# Patient Record
Sex: Male | Born: 1945 | Hispanic: Yes | Smoking: Former smoker
Health system: Southern US, Community
[De-identification: ages and names within clinical notes are randomized; demographics above are authoritative.]

## PROBLEM LIST (undated history)

## (undated) DIAGNOSIS — Z7901 Long term (current) use of anticoagulants: Secondary | ICD-10-CM

## (undated) DIAGNOSIS — M1990 Unspecified osteoarthritis, unspecified site: Secondary | ICD-10-CM

## (undated) DIAGNOSIS — I509 Heart failure, unspecified: Secondary | ICD-10-CM

## (undated) DIAGNOSIS — G8929 Other chronic pain: Secondary | ICD-10-CM

## (undated) DIAGNOSIS — I482 Chronic atrial fibrillation, unspecified: Secondary | ICD-10-CM

## (undated) DIAGNOSIS — I10 Essential (primary) hypertension: Secondary | ICD-10-CM

## (undated) DIAGNOSIS — R42 Dizziness and giddiness: Secondary | ICD-10-CM

## (undated) DIAGNOSIS — I420 Dilated cardiomyopathy: Secondary | ICD-10-CM

## (undated) DIAGNOSIS — I5022 Chronic systolic (congestive) heart failure: Secondary | ICD-10-CM

## (undated) DIAGNOSIS — E119 Type 2 diabetes mellitus without complications: Secondary | ICD-10-CM

## (undated) DIAGNOSIS — Z95810 Presence of automatic (implantable) cardiac defibrillator: Secondary | ICD-10-CM

## (undated) DIAGNOSIS — I429 Cardiomyopathy, unspecified: Secondary | ICD-10-CM

## (undated) DIAGNOSIS — E785 Hyperlipidemia, unspecified: Secondary | ICD-10-CM

## (undated) DIAGNOSIS — H269 Unspecified cataract: Secondary | ICD-10-CM

## (undated) DIAGNOSIS — I472 Ventricular tachycardia: Secondary | ICD-10-CM

## (undated) DIAGNOSIS — J449 Chronic obstructive pulmonary disease, unspecified: Secondary | ICD-10-CM

## (undated) HISTORY — DX: Heart failure, unspecified: I50.9

## (undated) HISTORY — PX: SPINE SURGERY: SHX786

## (undated) HISTORY — PX: EYE SURGERY: SHX253

## (undated) HISTORY — DX: Type 2 diabetes mellitus without complications: E11.9

## (undated) HISTORY — PX: ICD IMPLANT: EP1208

## (undated) HISTORY — DX: Unspecified cataract: H26.9

## (undated) HISTORY — DX: Hyperlipidemia, unspecified: E78.5

## (undated) HISTORY — DX: Essential (primary) hypertension: I10

## (undated) HISTORY — DX: Other chronic pain: G89.29

## (undated) HISTORY — DX: Chronic obstructive pulmonary disease, unspecified: J44.9

## (undated) HISTORY — DX: Unspecified osteoarthritis, unspecified site: M19.90

## (undated) HISTORY — DX: Dizziness and giddiness: R42

## (undated) HISTORY — DX: Cardiomyopathy, unspecified: I42.9

---
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
## 2011-09-21 DIAGNOSIS — E785 Hyperlipidemia, unspecified: Secondary | ICD-10-CM | POA: Diagnosis not present

## 2011-09-21 DIAGNOSIS — I10 Essential (primary) hypertension: Secondary | ICD-10-CM | POA: Diagnosis not present

## 2011-09-21 DIAGNOSIS — R42 Dizziness and giddiness: Secondary | ICD-10-CM | POA: Diagnosis not present

## 2011-11-02 DIAGNOSIS — M549 Dorsalgia, unspecified: Secondary | ICD-10-CM | POA: Diagnosis not present

## 2011-11-02 DIAGNOSIS — I10 Essential (primary) hypertension: Secondary | ICD-10-CM | POA: Diagnosis not present

## 2011-11-30 DIAGNOSIS — I10 Essential (primary) hypertension: Secondary | ICD-10-CM | POA: Diagnosis not present

## 2011-11-30 DIAGNOSIS — M549 Dorsalgia, unspecified: Secondary | ICD-10-CM | POA: Diagnosis not present

## 2011-12-29 DIAGNOSIS — I10 Essential (primary) hypertension: Secondary | ICD-10-CM | POA: Diagnosis not present

## 2011-12-29 DIAGNOSIS — M549 Dorsalgia, unspecified: Secondary | ICD-10-CM | POA: Diagnosis not present

## 2012-01-26 DIAGNOSIS — M549 Dorsalgia, unspecified: Secondary | ICD-10-CM | POA: Diagnosis not present

## 2012-02-23 DIAGNOSIS — I10 Essential (primary) hypertension: Secondary | ICD-10-CM | POA: Diagnosis not present

## 2012-02-23 DIAGNOSIS — M549 Dorsalgia, unspecified: Secondary | ICD-10-CM | POA: Diagnosis not present

## 2012-03-25 DIAGNOSIS — I10 Essential (primary) hypertension: Secondary | ICD-10-CM | POA: Diagnosis not present

## 2012-03-25 DIAGNOSIS — M549 Dorsalgia, unspecified: Secondary | ICD-10-CM | POA: Diagnosis not present

## 2012-04-25 DIAGNOSIS — E785 Hyperlipidemia, unspecified: Secondary | ICD-10-CM | POA: Diagnosis not present

## 2012-04-25 DIAGNOSIS — I10 Essential (primary) hypertension: Secondary | ICD-10-CM | POA: Diagnosis not present

## 2012-04-25 DIAGNOSIS — M549 Dorsalgia, unspecified: Secondary | ICD-10-CM | POA: Diagnosis not present

## 2012-04-25 DIAGNOSIS — E1129 Type 2 diabetes mellitus with other diabetic kidney complication: Secondary | ICD-10-CM | POA: Diagnosis not present

## 2012-05-23 DIAGNOSIS — M549 Dorsalgia, unspecified: Secondary | ICD-10-CM | POA: Diagnosis not present

## 2012-07-25 DIAGNOSIS — I2510 Atherosclerotic heart disease of native coronary artery without angina pectoris: Secondary | ICD-10-CM | POA: Diagnosis not present

## 2012-07-25 DIAGNOSIS — IMO0001 Reserved for inherently not codable concepts without codable children: Secondary | ICD-10-CM | POA: Diagnosis not present

## 2012-07-25 DIAGNOSIS — I10 Essential (primary) hypertension: Secondary | ICD-10-CM | POA: Diagnosis not present

## 2012-07-25 DIAGNOSIS — E785 Hyperlipidemia, unspecified: Secondary | ICD-10-CM | POA: Diagnosis not present

## 2012-08-14 DIAGNOSIS — E119 Type 2 diabetes mellitus without complications: Secondary | ICD-10-CM | POA: Diagnosis not present

## 2012-08-14 DIAGNOSIS — S335XXA Sprain of ligaments of lumbar spine, initial encounter: Secondary | ICD-10-CM | POA: Diagnosis not present

## 2012-08-14 DIAGNOSIS — X58XXXA Exposure to other specified factors, initial encounter: Secondary | ICD-10-CM | POA: Diagnosis not present

## 2012-08-14 DIAGNOSIS — F17200 Nicotine dependence, unspecified, uncomplicated: Secondary | ICD-10-CM | POA: Diagnosis not present

## 2012-08-14 DIAGNOSIS — I10 Essential (primary) hypertension: Secondary | ICD-10-CM | POA: Diagnosis not present

## 2012-08-14 DIAGNOSIS — R109 Unspecified abdominal pain: Secondary | ICD-10-CM | POA: Diagnosis not present

## 2012-08-14 DIAGNOSIS — I509 Heart failure, unspecified: Secondary | ICD-10-CM | POA: Diagnosis not present

## 2012-08-22 DIAGNOSIS — Z113 Encounter for screening for infections with a predominantly sexual mode of transmission: Secondary | ICD-10-CM | POA: Diagnosis not present

## 2012-08-22 DIAGNOSIS — E1129 Type 2 diabetes mellitus with other diabetic kidney complication: Secondary | ICD-10-CM | POA: Diagnosis not present

## 2012-08-22 DIAGNOSIS — R7989 Other specified abnormal findings of blood chemistry: Secondary | ICD-10-CM | POA: Diagnosis not present

## 2012-09-19 DIAGNOSIS — M549 Dorsalgia, unspecified: Secondary | ICD-10-CM | POA: Diagnosis not present

## 2012-09-19 DIAGNOSIS — E1129 Type 2 diabetes mellitus with other diabetic kidney complication: Secondary | ICD-10-CM | POA: Diagnosis not present

## 2012-09-19 DIAGNOSIS — I10 Essential (primary) hypertension: Secondary | ICD-10-CM | POA: Diagnosis not present

## 2012-10-22 DIAGNOSIS — M549 Dorsalgia, unspecified: Secondary | ICD-10-CM | POA: Diagnosis not present

## 2012-10-22 DIAGNOSIS — E119 Type 2 diabetes mellitus without complications: Secondary | ICD-10-CM | POA: Diagnosis not present

## 2012-10-22 DIAGNOSIS — E1165 Type 2 diabetes mellitus with hyperglycemia: Secondary | ICD-10-CM | POA: Diagnosis not present

## 2012-10-22 DIAGNOSIS — E1129 Type 2 diabetes mellitus with other diabetic kidney complication: Secondary | ICD-10-CM | POA: Diagnosis not present

## 2012-10-22 DIAGNOSIS — I10 Essential (primary) hypertension: Secondary | ICD-10-CM | POA: Diagnosis not present

## 2012-11-22 DIAGNOSIS — E1129 Type 2 diabetes mellitus with other diabetic kidney complication: Secondary | ICD-10-CM | POA: Diagnosis not present

## 2012-11-22 DIAGNOSIS — I10 Essential (primary) hypertension: Secondary | ICD-10-CM | POA: Diagnosis not present

## 2012-11-22 DIAGNOSIS — M549 Dorsalgia, unspecified: Secondary | ICD-10-CM | POA: Diagnosis not present

## 2012-12-24 DIAGNOSIS — M549 Dorsalgia, unspecified: Secondary | ICD-10-CM | POA: Diagnosis not present

## 2012-12-24 DIAGNOSIS — I10 Essential (primary) hypertension: Secondary | ICD-10-CM | POA: Diagnosis not present

## 2013-01-22 DIAGNOSIS — I10 Essential (primary) hypertension: Secondary | ICD-10-CM | POA: Diagnosis not present

## 2013-01-22 DIAGNOSIS — M549 Dorsalgia, unspecified: Secondary | ICD-10-CM | POA: Diagnosis not present

## 2013-01-22 DIAGNOSIS — E1129 Type 2 diabetes mellitus with other diabetic kidney complication: Secondary | ICD-10-CM | POA: Diagnosis not present

## 2013-02-21 DIAGNOSIS — M549 Dorsalgia, unspecified: Secondary | ICD-10-CM | POA: Diagnosis not present

## 2013-02-21 DIAGNOSIS — I10 Essential (primary) hypertension: Secondary | ICD-10-CM | POA: Diagnosis not present

## 2013-03-11 DIAGNOSIS — E119 Type 2 diabetes mellitus without complications: Secondary | ICD-10-CM | POA: Diagnosis not present

## 2013-03-11 DIAGNOSIS — I10 Essential (primary) hypertension: Secondary | ICD-10-CM | POA: Diagnosis not present

## 2013-03-11 DIAGNOSIS — J449 Chronic obstructive pulmonary disease, unspecified: Secondary | ICD-10-CM | POA: Diagnosis not present

## 2013-03-11 DIAGNOSIS — R209 Unspecified disturbances of skin sensation: Secondary | ICD-10-CM | POA: Diagnosis not present

## 2013-03-11 DIAGNOSIS — I509 Heart failure, unspecified: Secondary | ICD-10-CM | POA: Diagnosis not present

## 2013-03-11 DIAGNOSIS — I4891 Unspecified atrial fibrillation: Secondary | ICD-10-CM | POA: Diagnosis not present

## 2013-03-11 DIAGNOSIS — R5381 Other malaise: Secondary | ICD-10-CM | POA: Diagnosis not present

## 2013-03-24 DIAGNOSIS — I10 Essential (primary) hypertension: Secondary | ICD-10-CM | POA: Diagnosis not present

## 2013-03-24 DIAGNOSIS — G8929 Other chronic pain: Secondary | ICD-10-CM | POA: Diagnosis not present

## 2013-04-07 DIAGNOSIS — F411 Generalized anxiety disorder: Secondary | ICD-10-CM | POA: Diagnosis not present

## 2013-04-07 DIAGNOSIS — F331 Major depressive disorder, recurrent, moderate: Secondary | ICD-10-CM | POA: Diagnosis not present

## 2013-04-11 DIAGNOSIS — F331 Major depressive disorder, recurrent, moderate: Secondary | ICD-10-CM | POA: Diagnosis not present

## 2013-04-11 DIAGNOSIS — F411 Generalized anxiety disorder: Secondary | ICD-10-CM | POA: Diagnosis not present

## 2013-04-14 DIAGNOSIS — F411 Generalized anxiety disorder: Secondary | ICD-10-CM | POA: Diagnosis not present

## 2013-04-14 DIAGNOSIS — F331 Major depressive disorder, recurrent, moderate: Secondary | ICD-10-CM | POA: Diagnosis not present

## 2013-04-16 DIAGNOSIS — F411 Generalized anxiety disorder: Secondary | ICD-10-CM | POA: Diagnosis not present

## 2013-04-16 DIAGNOSIS — F331 Major depressive disorder, recurrent, moderate: Secondary | ICD-10-CM | POA: Diagnosis not present

## 2013-04-18 DIAGNOSIS — F331 Major depressive disorder, recurrent, moderate: Secondary | ICD-10-CM | POA: Diagnosis not present

## 2013-04-18 DIAGNOSIS — F411 Generalized anxiety disorder: Secondary | ICD-10-CM | POA: Diagnosis not present

## 2013-04-21 DIAGNOSIS — F331 Major depressive disorder, recurrent, moderate: Secondary | ICD-10-CM | POA: Diagnosis not present

## 2013-04-21 DIAGNOSIS — F411 Generalized anxiety disorder: Secondary | ICD-10-CM | POA: Diagnosis not present

## 2013-04-23 DIAGNOSIS — F331 Major depressive disorder, recurrent, moderate: Secondary | ICD-10-CM | POA: Diagnosis not present

## 2013-04-23 DIAGNOSIS — F411 Generalized anxiety disorder: Secondary | ICD-10-CM | POA: Diagnosis not present

## 2013-04-25 DIAGNOSIS — F411 Generalized anxiety disorder: Secondary | ICD-10-CM | POA: Diagnosis not present

## 2013-04-25 DIAGNOSIS — F331 Major depressive disorder, recurrent, moderate: Secondary | ICD-10-CM | POA: Diagnosis not present

## 2013-05-06 DIAGNOSIS — F411 Generalized anxiety disorder: Secondary | ICD-10-CM | POA: Diagnosis not present

## 2013-05-06 DIAGNOSIS — F331 Major depressive disorder, recurrent, moderate: Secondary | ICD-10-CM | POA: Diagnosis not present

## 2013-05-08 DIAGNOSIS — F331 Major depressive disorder, recurrent, moderate: Secondary | ICD-10-CM | POA: Diagnosis not present

## 2013-05-08 DIAGNOSIS — F411 Generalized anxiety disorder: Secondary | ICD-10-CM | POA: Diagnosis not present

## 2013-05-10 DIAGNOSIS — F331 Major depressive disorder, recurrent, moderate: Secondary | ICD-10-CM | POA: Diagnosis not present

## 2013-05-10 DIAGNOSIS — F411 Generalized anxiety disorder: Secondary | ICD-10-CM | POA: Diagnosis not present

## 2013-05-13 DIAGNOSIS — F411 Generalized anxiety disorder: Secondary | ICD-10-CM | POA: Diagnosis not present

## 2013-05-13 DIAGNOSIS — F331 Major depressive disorder, recurrent, moderate: Secondary | ICD-10-CM | POA: Diagnosis not present

## 2013-05-17 DIAGNOSIS — F331 Major depressive disorder, recurrent, moderate: Secondary | ICD-10-CM | POA: Diagnosis not present

## 2013-05-17 DIAGNOSIS — F411 Generalized anxiety disorder: Secondary | ICD-10-CM | POA: Diagnosis not present

## 2013-05-20 DIAGNOSIS — F411 Generalized anxiety disorder: Secondary | ICD-10-CM | POA: Diagnosis not present

## 2013-05-20 DIAGNOSIS — F331 Major depressive disorder, recurrent, moderate: Secondary | ICD-10-CM | POA: Diagnosis not present

## 2013-05-21 DIAGNOSIS — E785 Hyperlipidemia, unspecified: Secondary | ICD-10-CM | POA: Diagnosis not present

## 2013-05-22 DIAGNOSIS — F411 Generalized anxiety disorder: Secondary | ICD-10-CM | POA: Diagnosis not present

## 2013-05-22 DIAGNOSIS — F331 Major depressive disorder, recurrent, moderate: Secondary | ICD-10-CM | POA: Diagnosis not present

## 2013-05-24 DIAGNOSIS — F331 Major depressive disorder, recurrent, moderate: Secondary | ICD-10-CM | POA: Diagnosis not present

## 2013-05-24 DIAGNOSIS — F411 Generalized anxiety disorder: Secondary | ICD-10-CM | POA: Diagnosis not present

## 2013-05-26 DIAGNOSIS — F331 Major depressive disorder, recurrent, moderate: Secondary | ICD-10-CM | POA: Diagnosis not present

## 2013-05-26 DIAGNOSIS — F411 Generalized anxiety disorder: Secondary | ICD-10-CM | POA: Diagnosis not present

## 2013-05-28 DIAGNOSIS — F411 Generalized anxiety disorder: Secondary | ICD-10-CM | POA: Diagnosis not present

## 2013-05-28 DIAGNOSIS — F331 Major depressive disorder, recurrent, moderate: Secondary | ICD-10-CM | POA: Diagnosis not present

## 2013-05-30 DIAGNOSIS — F411 Generalized anxiety disorder: Secondary | ICD-10-CM | POA: Diagnosis not present

## 2013-05-30 DIAGNOSIS — F331 Major depressive disorder, recurrent, moderate: Secondary | ICD-10-CM | POA: Diagnosis not present

## 2013-06-02 DIAGNOSIS — F411 Generalized anxiety disorder: Secondary | ICD-10-CM | POA: Diagnosis not present

## 2013-06-02 DIAGNOSIS — F331 Major depressive disorder, recurrent, moderate: Secondary | ICD-10-CM | POA: Diagnosis not present

## 2013-06-04 DIAGNOSIS — F411 Generalized anxiety disorder: Secondary | ICD-10-CM | POA: Diagnosis not present

## 2013-06-04 DIAGNOSIS — F331 Major depressive disorder, recurrent, moderate: Secondary | ICD-10-CM | POA: Diagnosis not present

## 2013-06-06 DIAGNOSIS — F331 Major depressive disorder, recurrent, moderate: Secondary | ICD-10-CM | POA: Diagnosis not present

## 2013-06-06 DIAGNOSIS — F411 Generalized anxiety disorder: Secondary | ICD-10-CM | POA: Diagnosis not present

## 2013-06-09 DIAGNOSIS — F411 Generalized anxiety disorder: Secondary | ICD-10-CM | POA: Diagnosis not present

## 2013-06-09 DIAGNOSIS — F331 Major depressive disorder, recurrent, moderate: Secondary | ICD-10-CM | POA: Diagnosis not present

## 2013-06-11 DIAGNOSIS — F331 Major depressive disorder, recurrent, moderate: Secondary | ICD-10-CM | POA: Diagnosis not present

## 2013-06-11 DIAGNOSIS — F411 Generalized anxiety disorder: Secondary | ICD-10-CM | POA: Diagnosis not present

## 2013-06-13 DIAGNOSIS — F411 Generalized anxiety disorder: Secondary | ICD-10-CM | POA: Diagnosis not present

## 2013-06-13 DIAGNOSIS — F331 Major depressive disorder, recurrent, moderate: Secondary | ICD-10-CM | POA: Diagnosis not present

## 2013-07-24 DIAGNOSIS — G8929 Other chronic pain: Secondary | ICD-10-CM | POA: Diagnosis not present

## 2013-08-21 DIAGNOSIS — E785 Hyperlipidemia, unspecified: Secondary | ICD-10-CM | POA: Diagnosis not present

## 2013-08-21 DIAGNOSIS — G8929 Other chronic pain: Secondary | ICD-10-CM | POA: Diagnosis not present

## 2013-08-21 DIAGNOSIS — I10 Essential (primary) hypertension: Secondary | ICD-10-CM | POA: Diagnosis not present

## 2013-08-21 DIAGNOSIS — IMO0001 Reserved for inherently not codable concepts without codable children: Secondary | ICD-10-CM | POA: Diagnosis not present

## 2013-08-23 DIAGNOSIS — R0902 Hypoxemia: Secondary | ICD-10-CM | POA: Diagnosis not present

## 2013-08-23 DIAGNOSIS — R0602 Shortness of breath: Secondary | ICD-10-CM | POA: Diagnosis not present

## 2013-08-23 DIAGNOSIS — Z91199 Patient's noncompliance with other medical treatment and regimen due to unspecified reason: Secondary | ICD-10-CM | POA: Diagnosis not present

## 2013-08-23 DIAGNOSIS — I4891 Unspecified atrial fibrillation: Secondary | ICD-10-CM | POA: Diagnosis not present

## 2013-08-23 DIAGNOSIS — I4949 Other premature depolarization: Secondary | ICD-10-CM | POA: Diagnosis not present

## 2013-08-23 DIAGNOSIS — I472 Ventricular tachycardia, unspecified: Secondary | ICD-10-CM | POA: Diagnosis not present

## 2013-08-23 DIAGNOSIS — E1159 Type 2 diabetes mellitus with other circulatory complications: Secondary | ICD-10-CM | POA: Diagnosis not present

## 2013-08-23 DIAGNOSIS — I498 Other specified cardiac arrhythmias: Secondary | ICD-10-CM | POA: Diagnosis not present

## 2013-08-23 DIAGNOSIS — I10 Essential (primary) hypertension: Secondary | ICD-10-CM | POA: Diagnosis not present

## 2013-08-23 DIAGNOSIS — I4729 Other ventricular tachycardia: Secondary | ICD-10-CM | POA: Diagnosis not present

## 2013-08-23 DIAGNOSIS — Z87891 Personal history of nicotine dependence: Secondary | ICD-10-CM | POA: Diagnosis not present

## 2013-08-23 DIAGNOSIS — I2589 Other forms of chronic ischemic heart disease: Secondary | ICD-10-CM | POA: Diagnosis not present

## 2013-08-23 DIAGNOSIS — I369 Nonrheumatic tricuspid valve disorder, unspecified: Secondary | ICD-10-CM | POA: Diagnosis not present

## 2013-08-23 DIAGNOSIS — I059 Rheumatic mitral valve disease, unspecified: Secondary | ICD-10-CM | POA: Diagnosis not present

## 2013-08-23 DIAGNOSIS — Z9119 Patient's noncompliance with other medical treatment and regimen: Secondary | ICD-10-CM | POA: Diagnosis not present

## 2013-08-23 DIAGNOSIS — R0789 Other chest pain: Secondary | ICD-10-CM | POA: Diagnosis not present

## 2013-08-23 DIAGNOSIS — I471 Supraventricular tachycardia: Secondary | ICD-10-CM | POA: Diagnosis not present

## 2013-08-23 DIAGNOSIS — I252 Old myocardial infarction: Secondary | ICD-10-CM | POA: Diagnosis not present

## 2013-08-23 DIAGNOSIS — I5023 Acute on chronic systolic (congestive) heart failure: Secondary | ICD-10-CM | POA: Diagnosis not present

## 2013-08-23 DIAGNOSIS — J449 Chronic obstructive pulmonary disease, unspecified: Secondary | ICD-10-CM | POA: Diagnosis not present

## 2013-08-23 DIAGNOSIS — I5020 Unspecified systolic (congestive) heart failure: Secondary | ICD-10-CM | POA: Diagnosis not present

## 2013-08-23 DIAGNOSIS — J9600 Acute respiratory failure, unspecified whether with hypoxia or hypercapnia: Secondary | ICD-10-CM | POA: Diagnosis present

## 2013-08-23 DIAGNOSIS — I428 Other cardiomyopathies: Secondary | ICD-10-CM | POA: Diagnosis not present

## 2013-08-23 DIAGNOSIS — E119 Type 2 diabetes mellitus without complications: Secondary | ICD-10-CM | POA: Diagnosis present

## 2013-08-23 DIAGNOSIS — I359 Nonrheumatic aortic valve disorder, unspecified: Secondary | ICD-10-CM | POA: Diagnosis not present

## 2013-08-23 DIAGNOSIS — I501 Left ventricular failure: Secondary | ICD-10-CM | POA: Diagnosis not present

## 2013-08-23 DIAGNOSIS — I319 Disease of pericardium, unspecified: Secondary | ICD-10-CM | POA: Diagnosis not present

## 2013-08-23 DIAGNOSIS — I509 Heart failure, unspecified: Secondary | ICD-10-CM | POA: Diagnosis not present

## 2013-08-27 DIAGNOSIS — I501 Left ventricular failure: Secondary | ICD-10-CM | POA: Diagnosis not present

## 2013-08-28 DIAGNOSIS — I5023 Acute on chronic systolic (congestive) heart failure: Secondary | ICD-10-CM | POA: Diagnosis present

## 2013-08-28 DIAGNOSIS — E119 Type 2 diabetes mellitus without complications: Secondary | ICD-10-CM | POA: Diagnosis present

## 2013-08-28 DIAGNOSIS — I472 Ventricular tachycardia: Secondary | ICD-10-CM | POA: Diagnosis present

## 2013-08-28 DIAGNOSIS — I10 Essential (primary) hypertension: Secondary | ICD-10-CM | POA: Diagnosis present

## 2013-08-28 DIAGNOSIS — R079 Chest pain, unspecified: Secondary | ICD-10-CM | POA: Diagnosis not present

## 2013-08-28 DIAGNOSIS — I4729 Other ventricular tachycardia: Secondary | ICD-10-CM | POA: Diagnosis present

## 2013-08-28 DIAGNOSIS — I4949 Other premature depolarization: Secondary | ICD-10-CM | POA: Diagnosis present

## 2013-08-28 DIAGNOSIS — I252 Old myocardial infarction: Secondary | ICD-10-CM | POA: Diagnosis not present

## 2013-08-28 DIAGNOSIS — I4891 Unspecified atrial fibrillation: Secondary | ICD-10-CM | POA: Diagnosis present

## 2013-08-28 DIAGNOSIS — I509 Heart failure, unspecified: Secondary | ICD-10-CM | POA: Diagnosis present

## 2013-08-28 DIAGNOSIS — I428 Other cardiomyopathies: Secondary | ICD-10-CM | POA: Diagnosis present

## 2013-08-28 DIAGNOSIS — I501 Left ventricular failure: Secondary | ICD-10-CM | POA: Diagnosis not present

## 2013-08-28 DIAGNOSIS — Z87891 Personal history of nicotine dependence: Secondary | ICD-10-CM | POA: Diagnosis not present

## 2013-08-28 DIAGNOSIS — J449 Chronic obstructive pulmonary disease, unspecified: Secondary | ICD-10-CM | POA: Diagnosis present

## 2013-08-29 DIAGNOSIS — I4891 Unspecified atrial fibrillation: Secondary | ICD-10-CM | POA: Diagnosis not present

## 2013-08-29 DIAGNOSIS — I501 Left ventricular failure: Secondary | ICD-10-CM | POA: Diagnosis not present

## 2013-09-01 DIAGNOSIS — I2510 Atherosclerotic heart disease of native coronary artery without angina pectoris: Secondary | ICD-10-CM | POA: Diagnosis not present

## 2013-09-01 DIAGNOSIS — I4891 Unspecified atrial fibrillation: Secondary | ICD-10-CM | POA: Diagnosis not present

## 2013-09-01 DIAGNOSIS — I509 Heart failure, unspecified: Secondary | ICD-10-CM | POA: Diagnosis not present

## 2013-09-01 DIAGNOSIS — E119 Type 2 diabetes mellitus without complications: Secondary | ICD-10-CM | POA: Diagnosis not present

## 2013-09-01 DIAGNOSIS — I10 Essential (primary) hypertension: Secondary | ICD-10-CM | POA: Diagnosis not present

## 2013-09-01 DIAGNOSIS — Z79899 Other long term (current) drug therapy: Secondary | ICD-10-CM | POA: Diagnosis not present

## 2013-09-01 DIAGNOSIS — Z7901 Long term (current) use of anticoagulants: Secondary | ICD-10-CM | POA: Diagnosis not present

## 2013-09-01 DIAGNOSIS — J449 Chronic obstructive pulmonary disease, unspecified: Secondary | ICD-10-CM | POA: Diagnosis not present

## 2013-09-01 DIAGNOSIS — I428 Other cardiomyopathies: Secondary | ICD-10-CM | POA: Diagnosis not present

## 2013-09-01 DIAGNOSIS — Z87891 Personal history of nicotine dependence: Secondary | ICD-10-CM | POA: Diagnosis not present

## 2013-09-02 DIAGNOSIS — Z7901 Long term (current) use of anticoagulants: Secondary | ICD-10-CM | POA: Diagnosis not present

## 2013-09-02 DIAGNOSIS — I2510 Atherosclerotic heart disease of native coronary artery without angina pectoris: Secondary | ICD-10-CM | POA: Diagnosis not present

## 2013-09-02 DIAGNOSIS — I10 Essential (primary) hypertension: Secondary | ICD-10-CM | POA: Diagnosis not present

## 2013-09-02 DIAGNOSIS — Z79899 Other long term (current) drug therapy: Secondary | ICD-10-CM | POA: Diagnosis not present

## 2013-09-02 DIAGNOSIS — I509 Heart failure, unspecified: Secondary | ICD-10-CM | POA: Diagnosis not present

## 2013-09-02 DIAGNOSIS — Z45018 Encounter for adjustment and management of other part of cardiac pacemaker: Secondary | ICD-10-CM | POA: Diagnosis not present

## 2013-09-02 DIAGNOSIS — J449 Chronic obstructive pulmonary disease, unspecified: Secondary | ICD-10-CM | POA: Diagnosis not present

## 2013-09-02 DIAGNOSIS — I501 Left ventricular failure: Secondary | ICD-10-CM | POA: Diagnosis not present

## 2013-09-02 DIAGNOSIS — I428 Other cardiomyopathies: Secondary | ICD-10-CM | POA: Diagnosis not present

## 2013-09-02 DIAGNOSIS — I4891 Unspecified atrial fibrillation: Secondary | ICD-10-CM | POA: Diagnosis not present

## 2013-09-03 DIAGNOSIS — I428 Other cardiomyopathies: Secondary | ICD-10-CM | POA: Diagnosis not present

## 2013-09-03 DIAGNOSIS — I10 Essential (primary) hypertension: Secondary | ICD-10-CM | POA: Diagnosis not present

## 2013-09-03 DIAGNOSIS — Z7901 Long term (current) use of anticoagulants: Secondary | ICD-10-CM | POA: Diagnosis not present

## 2013-09-03 DIAGNOSIS — I4891 Unspecified atrial fibrillation: Secondary | ICD-10-CM | POA: Diagnosis not present

## 2013-09-03 DIAGNOSIS — Z79899 Other long term (current) drug therapy: Secondary | ICD-10-CM | POA: Diagnosis not present

## 2013-09-03 DIAGNOSIS — I509 Heart failure, unspecified: Secondary | ICD-10-CM | POA: Diagnosis not present

## 2013-09-16 DIAGNOSIS — I428 Other cardiomyopathies: Secondary | ICD-10-CM | POA: Diagnosis not present

## 2013-09-16 DIAGNOSIS — Z95810 Presence of automatic (implantable) cardiac defibrillator: Secondary | ICD-10-CM | POA: Diagnosis not present

## 2013-09-18 DIAGNOSIS — I509 Heart failure, unspecified: Secondary | ICD-10-CM | POA: Diagnosis not present

## 2013-09-18 DIAGNOSIS — G8929 Other chronic pain: Secondary | ICD-10-CM | POA: Diagnosis not present

## 2013-10-20 DIAGNOSIS — J449 Chronic obstructive pulmonary disease, unspecified: Secondary | ICD-10-CM | POA: Diagnosis not present

## 2013-10-20 DIAGNOSIS — G8929 Other chronic pain: Secondary | ICD-10-CM | POA: Diagnosis not present

## 2013-11-12 DIAGNOSIS — I10 Essential (primary) hypertension: Secondary | ICD-10-CM | POA: Diagnosis not present

## 2013-11-12 DIAGNOSIS — Z95810 Presence of automatic (implantable) cardiac defibrillator: Secondary | ICD-10-CM | POA: Diagnosis not present

## 2013-11-12 DIAGNOSIS — I252 Old myocardial infarction: Secondary | ICD-10-CM | POA: Diagnosis not present

## 2013-11-12 DIAGNOSIS — R0602 Shortness of breath: Secondary | ICD-10-CM | POA: Diagnosis not present

## 2013-11-12 DIAGNOSIS — I4891 Unspecified atrial fibrillation: Secondary | ICD-10-CM | POA: Diagnosis not present

## 2013-11-12 DIAGNOSIS — J441 Chronic obstructive pulmonary disease with (acute) exacerbation: Secondary | ICD-10-CM | POA: Diagnosis not present

## 2013-11-12 DIAGNOSIS — J9819 Other pulmonary collapse: Secondary | ICD-10-CM | POA: Diagnosis not present

## 2013-11-12 DIAGNOSIS — E119 Type 2 diabetes mellitus without complications: Secondary | ICD-10-CM | POA: Diagnosis not present

## 2013-11-12 DIAGNOSIS — I509 Heart failure, unspecified: Secondary | ICD-10-CM | POA: Diagnosis not present

## 2013-12-23 DIAGNOSIS — E669 Obesity, unspecified: Secondary | ICD-10-CM | POA: Diagnosis not present

## 2013-12-23 DIAGNOSIS — E119 Type 2 diabetes mellitus without complications: Secondary | ICD-10-CM | POA: Diagnosis not present

## 2013-12-23 DIAGNOSIS — J449 Chronic obstructive pulmonary disease, unspecified: Secondary | ICD-10-CM | POA: Diagnosis not present

## 2013-12-23 DIAGNOSIS — I10 Essential (primary) hypertension: Secondary | ICD-10-CM | POA: Diagnosis not present

## 2014-01-08 DIAGNOSIS — R0989 Other specified symptoms and signs involving the circulatory and respiratory systems: Secondary | ICD-10-CM | POA: Diagnosis not present

## 2014-01-08 DIAGNOSIS — J449 Chronic obstructive pulmonary disease, unspecified: Secondary | ICD-10-CM | POA: Diagnosis not present

## 2014-01-08 DIAGNOSIS — I509 Heart failure, unspecified: Secondary | ICD-10-CM | POA: Diagnosis not present

## 2014-01-08 DIAGNOSIS — R0609 Other forms of dyspnea: Secondary | ICD-10-CM | POA: Diagnosis not present

## 2014-01-08 DIAGNOSIS — R0602 Shortness of breath: Secondary | ICD-10-CM | POA: Diagnosis not present

## 2014-01-10 DIAGNOSIS — I4891 Unspecified atrial fibrillation: Secondary | ICD-10-CM | POA: Diagnosis not present

## 2014-01-10 DIAGNOSIS — Z7982 Long term (current) use of aspirin: Secondary | ICD-10-CM | POA: Diagnosis not present

## 2014-01-10 DIAGNOSIS — E119 Type 2 diabetes mellitus without complications: Secondary | ICD-10-CM | POA: Diagnosis present

## 2014-01-10 DIAGNOSIS — Z9119 Patient's noncompliance with other medical treatment and regimen: Secondary | ICD-10-CM | POA: Diagnosis not present

## 2014-01-10 DIAGNOSIS — I252 Old myocardial infarction: Secondary | ICD-10-CM | POA: Diagnosis not present

## 2014-01-10 DIAGNOSIS — I428 Other cardiomyopathies: Secondary | ICD-10-CM | POA: Diagnosis not present

## 2014-01-10 DIAGNOSIS — Z7901 Long term (current) use of anticoagulants: Secondary | ICD-10-CM | POA: Diagnosis not present

## 2014-01-10 DIAGNOSIS — Z91199 Patient's noncompliance with other medical treatment and regimen due to unspecified reason: Secondary | ICD-10-CM | POA: Diagnosis not present

## 2014-01-10 DIAGNOSIS — R0609 Other forms of dyspnea: Secondary | ICD-10-CM | POA: Diagnosis not present

## 2014-01-10 DIAGNOSIS — J90 Pleural effusion, not elsewhere classified: Secondary | ICD-10-CM | POA: Diagnosis not present

## 2014-01-10 DIAGNOSIS — I5023 Acute on chronic systolic (congestive) heart failure: Secondary | ICD-10-CM | POA: Diagnosis not present

## 2014-01-10 DIAGNOSIS — I2510 Atherosclerotic heart disease of native coronary artery without angina pectoris: Secondary | ICD-10-CM | POA: Diagnosis present

## 2014-01-10 DIAGNOSIS — Z79899 Other long term (current) drug therapy: Secondary | ICD-10-CM | POA: Diagnosis not present

## 2014-01-10 DIAGNOSIS — I509 Heart failure, unspecified: Secondary | ICD-10-CM | POA: Diagnosis not present

## 2014-01-10 DIAGNOSIS — R0602 Shortness of breath: Secondary | ICD-10-CM | POA: Diagnosis not present

## 2014-01-10 DIAGNOSIS — Z87891 Personal history of nicotine dependence: Secondary | ICD-10-CM | POA: Diagnosis not present

## 2014-01-10 DIAGNOSIS — Z95810 Presence of automatic (implantable) cardiac defibrillator: Secondary | ICD-10-CM | POA: Diagnosis not present

## 2014-01-10 DIAGNOSIS — M129 Arthropathy, unspecified: Secondary | ICD-10-CM | POA: Diagnosis present

## 2014-01-10 DIAGNOSIS — R0989 Other specified symptoms and signs involving the circulatory and respiratory systems: Secondary | ICD-10-CM | POA: Diagnosis not present

## 2014-01-10 DIAGNOSIS — R0902 Hypoxemia: Secondary | ICD-10-CM | POA: Diagnosis not present

## 2014-01-10 DIAGNOSIS — I10 Essential (primary) hypertension: Secondary | ICD-10-CM | POA: Diagnosis present

## 2014-01-10 DIAGNOSIS — J441 Chronic obstructive pulmonary disease with (acute) exacerbation: Secondary | ICD-10-CM | POA: Diagnosis not present

## 2014-01-15 DIAGNOSIS — I4891 Unspecified atrial fibrillation: Secondary | ICD-10-CM | POA: Diagnosis not present

## 2014-01-15 DIAGNOSIS — I10 Essential (primary) hypertension: Secondary | ICD-10-CM | POA: Diagnosis not present

## 2014-01-15 DIAGNOSIS — I5023 Acute on chronic systolic (congestive) heart failure: Secondary | ICD-10-CM | POA: Diagnosis not present

## 2014-01-15 DIAGNOSIS — J441 Chronic obstructive pulmonary disease with (acute) exacerbation: Secondary | ICD-10-CM | POA: Diagnosis not present

## 2014-01-15 DIAGNOSIS — E119 Type 2 diabetes mellitus without complications: Secondary | ICD-10-CM | POA: Diagnosis not present

## 2014-01-15 DIAGNOSIS — I2510 Atherosclerotic heart disease of native coronary artery without angina pectoris: Secondary | ICD-10-CM | POA: Diagnosis not present

## 2014-01-16 DIAGNOSIS — I509 Heart failure, unspecified: Secondary | ICD-10-CM | POA: Diagnosis not present

## 2014-01-16 DIAGNOSIS — I4891 Unspecified atrial fibrillation: Secondary | ICD-10-CM | POA: Diagnosis not present

## 2014-01-16 DIAGNOSIS — I2510 Atherosclerotic heart disease of native coronary artery without angina pectoris: Secondary | ICD-10-CM | POA: Diagnosis not present

## 2014-01-16 DIAGNOSIS — I5023 Acute on chronic systolic (congestive) heart failure: Secondary | ICD-10-CM | POA: Diagnosis not present

## 2014-01-16 DIAGNOSIS — J441 Chronic obstructive pulmonary disease with (acute) exacerbation: Secondary | ICD-10-CM | POA: Diagnosis not present

## 2014-01-16 DIAGNOSIS — E119 Type 2 diabetes mellitus without complications: Secondary | ICD-10-CM | POA: Diagnosis not present

## 2014-01-16 DIAGNOSIS — I10 Essential (primary) hypertension: Secondary | ICD-10-CM | POA: Diagnosis not present

## 2014-01-21 DIAGNOSIS — I10 Essential (primary) hypertension: Secondary | ICD-10-CM | POA: Diagnosis not present

## 2014-01-21 DIAGNOSIS — I2510 Atherosclerotic heart disease of native coronary artery without angina pectoris: Secondary | ICD-10-CM | POA: Diagnosis not present

## 2014-01-21 DIAGNOSIS — I5023 Acute on chronic systolic (congestive) heart failure: Secondary | ICD-10-CM | POA: Diagnosis not present

## 2014-01-21 DIAGNOSIS — I4891 Unspecified atrial fibrillation: Secondary | ICD-10-CM | POA: Diagnosis not present

## 2014-01-21 DIAGNOSIS — J441 Chronic obstructive pulmonary disease with (acute) exacerbation: Secondary | ICD-10-CM | POA: Diagnosis not present

## 2014-01-21 DIAGNOSIS — E119 Type 2 diabetes mellitus without complications: Secondary | ICD-10-CM | POA: Diagnosis not present

## 2014-01-22 DIAGNOSIS — I4891 Unspecified atrial fibrillation: Secondary | ICD-10-CM | POA: Diagnosis not present

## 2014-01-22 DIAGNOSIS — I10 Essential (primary) hypertension: Secondary | ICD-10-CM | POA: Diagnosis not present

## 2014-01-22 DIAGNOSIS — I2510 Atherosclerotic heart disease of native coronary artery without angina pectoris: Secondary | ICD-10-CM | POA: Diagnosis not present

## 2014-01-22 DIAGNOSIS — E119 Type 2 diabetes mellitus without complications: Secondary | ICD-10-CM | POA: Diagnosis not present

## 2014-01-22 DIAGNOSIS — J441 Chronic obstructive pulmonary disease with (acute) exacerbation: Secondary | ICD-10-CM | POA: Diagnosis not present

## 2014-01-22 DIAGNOSIS — I5023 Acute on chronic systolic (congestive) heart failure: Secondary | ICD-10-CM | POA: Diagnosis not present

## 2014-01-23 DIAGNOSIS — I4891 Unspecified atrial fibrillation: Secondary | ICD-10-CM | POA: Diagnosis not present

## 2014-01-23 DIAGNOSIS — I5023 Acute on chronic systolic (congestive) heart failure: Secondary | ICD-10-CM | POA: Diagnosis not present

## 2014-01-23 DIAGNOSIS — I10 Essential (primary) hypertension: Secondary | ICD-10-CM | POA: Diagnosis not present

## 2014-01-23 DIAGNOSIS — I2510 Atherosclerotic heart disease of native coronary artery without angina pectoris: Secondary | ICD-10-CM | POA: Diagnosis not present

## 2014-01-23 DIAGNOSIS — J441 Chronic obstructive pulmonary disease with (acute) exacerbation: Secondary | ICD-10-CM | POA: Diagnosis not present

## 2014-01-23 DIAGNOSIS — E119 Type 2 diabetes mellitus without complications: Secondary | ICD-10-CM | POA: Diagnosis not present

## 2014-01-26 DIAGNOSIS — J441 Chronic obstructive pulmonary disease with (acute) exacerbation: Secondary | ICD-10-CM | POA: Diagnosis not present

## 2014-01-26 DIAGNOSIS — E119 Type 2 diabetes mellitus without complications: Secondary | ICD-10-CM | POA: Diagnosis not present

## 2014-01-26 DIAGNOSIS — I5023 Acute on chronic systolic (congestive) heart failure: Secondary | ICD-10-CM | POA: Diagnosis not present

## 2014-01-26 DIAGNOSIS — I2510 Atherosclerotic heart disease of native coronary artery without angina pectoris: Secondary | ICD-10-CM | POA: Diagnosis not present

## 2014-01-26 DIAGNOSIS — I4891 Unspecified atrial fibrillation: Secondary | ICD-10-CM | POA: Diagnosis not present

## 2014-01-26 DIAGNOSIS — I10 Essential (primary) hypertension: Secondary | ICD-10-CM | POA: Diagnosis not present

## 2014-01-29 DIAGNOSIS — I428 Other cardiomyopathies: Secondary | ICD-10-CM | POA: Diagnosis not present

## 2014-01-30 DIAGNOSIS — E119 Type 2 diabetes mellitus without complications: Secondary | ICD-10-CM | POA: Diagnosis not present

## 2014-01-30 DIAGNOSIS — I4891 Unspecified atrial fibrillation: Secondary | ICD-10-CM | POA: Diagnosis not present

## 2014-01-30 DIAGNOSIS — I10 Essential (primary) hypertension: Secondary | ICD-10-CM | POA: Diagnosis not present

## 2014-01-30 DIAGNOSIS — I2510 Atherosclerotic heart disease of native coronary artery without angina pectoris: Secondary | ICD-10-CM | POA: Diagnosis not present

## 2014-01-30 DIAGNOSIS — I5023 Acute on chronic systolic (congestive) heart failure: Secondary | ICD-10-CM | POA: Diagnosis not present

## 2014-01-30 DIAGNOSIS — J441 Chronic obstructive pulmonary disease with (acute) exacerbation: Secondary | ICD-10-CM | POA: Diagnosis not present

## 2014-02-02 DIAGNOSIS — I10 Essential (primary) hypertension: Secondary | ICD-10-CM | POA: Diagnosis not present

## 2014-02-02 DIAGNOSIS — J441 Chronic obstructive pulmonary disease with (acute) exacerbation: Secondary | ICD-10-CM | POA: Diagnosis not present

## 2014-02-02 DIAGNOSIS — I5023 Acute on chronic systolic (congestive) heart failure: Secondary | ICD-10-CM | POA: Diagnosis not present

## 2014-02-02 DIAGNOSIS — I2510 Atherosclerotic heart disease of native coronary artery without angina pectoris: Secondary | ICD-10-CM | POA: Diagnosis not present

## 2014-02-02 DIAGNOSIS — E119 Type 2 diabetes mellitus without complications: Secondary | ICD-10-CM | POA: Diagnosis not present

## 2014-02-02 DIAGNOSIS — I4891 Unspecified atrial fibrillation: Secondary | ICD-10-CM | POA: Diagnosis not present

## 2014-02-03 DIAGNOSIS — I10 Essential (primary) hypertension: Secondary | ICD-10-CM | POA: Diagnosis not present

## 2014-02-03 DIAGNOSIS — E118 Type 2 diabetes mellitus with unspecified complications: Secondary | ICD-10-CM | POA: Diagnosis not present

## 2014-02-03 DIAGNOSIS — I509 Heart failure, unspecified: Secondary | ICD-10-CM | POA: Diagnosis not present

## 2014-02-03 DIAGNOSIS — Z6833 Body mass index (BMI) 33.0-33.9, adult: Secondary | ICD-10-CM | POA: Diagnosis not present

## 2014-02-05 DIAGNOSIS — E119 Type 2 diabetes mellitus without complications: Secondary | ICD-10-CM | POA: Diagnosis not present

## 2014-02-05 DIAGNOSIS — J441 Chronic obstructive pulmonary disease with (acute) exacerbation: Secondary | ICD-10-CM | POA: Diagnosis not present

## 2014-02-05 DIAGNOSIS — I4891 Unspecified atrial fibrillation: Secondary | ICD-10-CM | POA: Diagnosis not present

## 2014-02-05 DIAGNOSIS — I10 Essential (primary) hypertension: Secondary | ICD-10-CM | POA: Diagnosis not present

## 2014-02-05 DIAGNOSIS — Z95810 Presence of automatic (implantable) cardiac defibrillator: Secondary | ICD-10-CM | POA: Diagnosis not present

## 2014-02-05 DIAGNOSIS — I5023 Acute on chronic systolic (congestive) heart failure: Secondary | ICD-10-CM | POA: Diagnosis not present

## 2014-02-05 DIAGNOSIS — I428 Other cardiomyopathies: Secondary | ICD-10-CM | POA: Diagnosis not present

## 2014-02-05 DIAGNOSIS — J449 Chronic obstructive pulmonary disease, unspecified: Secondary | ICD-10-CM | POA: Diagnosis not present

## 2014-02-05 DIAGNOSIS — I2510 Atherosclerotic heart disease of native coronary artery without angina pectoris: Secondary | ICD-10-CM | POA: Diagnosis not present

## 2014-02-20 DIAGNOSIS — R279 Unspecified lack of coordination: Secondary | ICD-10-CM | POA: Diagnosis not present

## 2014-02-20 DIAGNOSIS — R413 Other amnesia: Secondary | ICD-10-CM | POA: Diagnosis not present

## 2014-02-20 DIAGNOSIS — M6281 Muscle weakness (generalized): Secondary | ICD-10-CM | POA: Diagnosis not present

## 2014-02-20 DIAGNOSIS — J449 Chronic obstructive pulmonary disease, unspecified: Secondary | ICD-10-CM | POA: Diagnosis not present

## 2014-02-20 DIAGNOSIS — IMO0001 Reserved for inherently not codable concepts without codable children: Secondary | ICD-10-CM | POA: Diagnosis not present

## 2014-02-20 DIAGNOSIS — R4189 Other symptoms and signs involving cognitive functions and awareness: Secondary | ICD-10-CM | POA: Diagnosis not present

## 2014-02-24 DIAGNOSIS — IMO0001 Reserved for inherently not codable concepts without codable children: Secondary | ICD-10-CM | POA: Diagnosis not present

## 2014-02-24 DIAGNOSIS — R413 Other amnesia: Secondary | ICD-10-CM | POA: Diagnosis not present

## 2014-02-24 DIAGNOSIS — R4189 Other symptoms and signs involving cognitive functions and awareness: Secondary | ICD-10-CM | POA: Diagnosis not present

## 2014-02-24 DIAGNOSIS — J449 Chronic obstructive pulmonary disease, unspecified: Secondary | ICD-10-CM | POA: Diagnosis not present

## 2014-02-24 DIAGNOSIS — R279 Unspecified lack of coordination: Secondary | ICD-10-CM | POA: Diagnosis not present

## 2014-02-24 DIAGNOSIS — M6281 Muscle weakness (generalized): Secondary | ICD-10-CM | POA: Diagnosis not present

## 2014-02-27 DIAGNOSIS — IMO0001 Reserved for inherently not codable concepts without codable children: Secondary | ICD-10-CM | POA: Diagnosis not present

## 2014-02-27 DIAGNOSIS — J449 Chronic obstructive pulmonary disease, unspecified: Secondary | ICD-10-CM | POA: Diagnosis not present

## 2014-02-27 DIAGNOSIS — M6281 Muscle weakness (generalized): Secondary | ICD-10-CM | POA: Diagnosis not present

## 2014-02-27 DIAGNOSIS — R4189 Other symptoms and signs involving cognitive functions and awareness: Secondary | ICD-10-CM | POA: Diagnosis not present

## 2014-02-27 DIAGNOSIS — R413 Other amnesia: Secondary | ICD-10-CM | POA: Diagnosis not present

## 2014-02-27 DIAGNOSIS — R279 Unspecified lack of coordination: Secondary | ICD-10-CM | POA: Diagnosis not present

## 2014-03-03 DIAGNOSIS — E1129 Type 2 diabetes mellitus with other diabetic kidney complication: Secondary | ICD-10-CM | POA: Diagnosis not present

## 2014-03-03 DIAGNOSIS — I10 Essential (primary) hypertension: Secondary | ICD-10-CM | POA: Diagnosis not present

## 2014-03-03 DIAGNOSIS — I509 Heart failure, unspecified: Secondary | ICD-10-CM | POA: Diagnosis not present

## 2014-03-03 DIAGNOSIS — Z6833 Body mass index (BMI) 33.0-33.9, adult: Secondary | ICD-10-CM | POA: Diagnosis not present

## 2014-03-04 DIAGNOSIS — IMO0001 Reserved for inherently not codable concepts without codable children: Secondary | ICD-10-CM | POA: Diagnosis not present

## 2014-03-04 DIAGNOSIS — R413 Other amnesia: Secondary | ICD-10-CM | POA: Diagnosis not present

## 2014-03-04 DIAGNOSIS — M6281 Muscle weakness (generalized): Secondary | ICD-10-CM | POA: Diagnosis not present

## 2014-03-04 DIAGNOSIS — R4189 Other symptoms and signs involving cognitive functions and awareness: Secondary | ICD-10-CM | POA: Diagnosis not present

## 2014-03-04 DIAGNOSIS — R279 Unspecified lack of coordination: Secondary | ICD-10-CM | POA: Diagnosis not present

## 2014-03-04 DIAGNOSIS — J449 Chronic obstructive pulmonary disease, unspecified: Secondary | ICD-10-CM | POA: Diagnosis not present

## 2014-03-25 DIAGNOSIS — M6281 Muscle weakness (generalized): Secondary | ICD-10-CM | POA: Diagnosis not present

## 2014-03-25 DIAGNOSIS — E1165 Type 2 diabetes mellitus with hyperglycemia: Secondary | ICD-10-CM | POA: Diagnosis not present

## 2014-03-25 DIAGNOSIS — R4189 Other symptoms and signs involving cognitive functions and awareness: Secondary | ICD-10-CM | POA: Diagnosis not present

## 2014-04-03 DIAGNOSIS — J449 Chronic obstructive pulmonary disease, unspecified: Secondary | ICD-10-CM | POA: Diagnosis not present

## 2014-04-03 DIAGNOSIS — E1129 Type 2 diabetes mellitus with other diabetic kidney complication: Secondary | ICD-10-CM | POA: Diagnosis not present

## 2014-04-03 DIAGNOSIS — M549 Dorsalgia, unspecified: Secondary | ICD-10-CM | POA: Diagnosis not present

## 2014-04-03 DIAGNOSIS — Z6833 Body mass index (BMI) 33.0-33.9, adult: Secondary | ICD-10-CM | POA: Diagnosis not present

## 2014-04-03 DIAGNOSIS — I509 Heart failure, unspecified: Secondary | ICD-10-CM | POA: Diagnosis not present

## 2014-04-03 DIAGNOSIS — E785 Hyperlipidemia, unspecified: Secondary | ICD-10-CM | POA: Diagnosis not present

## 2014-04-24 DIAGNOSIS — J449 Chronic obstructive pulmonary disease, unspecified: Secondary | ICD-10-CM | POA: Diagnosis not present

## 2014-04-24 DIAGNOSIS — I4891 Unspecified atrial fibrillation: Secondary | ICD-10-CM | POA: Diagnosis not present

## 2014-04-24 DIAGNOSIS — Z87891 Personal history of nicotine dependence: Secondary | ICD-10-CM | POA: Diagnosis not present

## 2014-04-24 DIAGNOSIS — M7989 Other specified soft tissue disorders: Secondary | ICD-10-CM | POA: Diagnosis not present

## 2014-04-24 DIAGNOSIS — I10 Essential (primary) hypertension: Secondary | ICD-10-CM | POA: Diagnosis not present

## 2014-04-24 DIAGNOSIS — Z7982 Long term (current) use of aspirin: Secondary | ICD-10-CM | POA: Diagnosis not present

## 2014-04-24 DIAGNOSIS — E118 Type 2 diabetes mellitus with unspecified complications: Secondary | ICD-10-CM | POA: Diagnosis not present

## 2014-04-24 DIAGNOSIS — I509 Heart failure, unspecified: Secondary | ICD-10-CM | POA: Diagnosis not present

## 2014-05-04 DIAGNOSIS — E785 Hyperlipidemia, unspecified: Secondary | ICD-10-CM | POA: Diagnosis not present

## 2014-05-04 DIAGNOSIS — I10 Essential (primary) hypertension: Secondary | ICD-10-CM | POA: Diagnosis not present

## 2014-05-04 DIAGNOSIS — I4891 Unspecified atrial fibrillation: Secondary | ICD-10-CM | POA: Diagnosis not present

## 2014-05-04 DIAGNOSIS — E119 Type 2 diabetes mellitus without complications: Secondary | ICD-10-CM | POA: Diagnosis not present

## 2014-05-04 DIAGNOSIS — M549 Dorsalgia, unspecified: Secondary | ICD-10-CM | POA: Diagnosis not present

## 2014-06-03 DIAGNOSIS — Z9181 History of falling: Secondary | ICD-10-CM | POA: Diagnosis not present

## 2014-06-03 DIAGNOSIS — M549 Dorsalgia, unspecified: Secondary | ICD-10-CM | POA: Diagnosis not present

## 2014-06-03 DIAGNOSIS — Z23 Encounter for immunization: Secondary | ICD-10-CM | POA: Diagnosis not present

## 2014-06-03 DIAGNOSIS — Z1389 Encounter for screening for other disorder: Secondary | ICD-10-CM | POA: Diagnosis not present

## 2014-06-03 DIAGNOSIS — Z6833 Body mass index (BMI) 33.0-33.9, adult: Secondary | ICD-10-CM | POA: Diagnosis not present

## 2014-07-06 DIAGNOSIS — Z1389 Encounter for screening for other disorder: Secondary | ICD-10-CM | POA: Diagnosis not present

## 2014-07-06 DIAGNOSIS — Z9181 History of falling: Secondary | ICD-10-CM | POA: Diagnosis not present

## 2014-07-06 DIAGNOSIS — I10 Essential (primary) hypertension: Secondary | ICD-10-CM | POA: Diagnosis not present

## 2014-07-06 DIAGNOSIS — E1129 Type 2 diabetes mellitus with other diabetic kidney complication: Secondary | ICD-10-CM | POA: Diagnosis not present

## 2014-07-06 DIAGNOSIS — G8929 Other chronic pain: Secondary | ICD-10-CM | POA: Diagnosis not present

## 2014-07-06 DIAGNOSIS — J449 Chronic obstructive pulmonary disease, unspecified: Secondary | ICD-10-CM | POA: Diagnosis not present

## 2014-07-06 DIAGNOSIS — Z6834 Body mass index (BMI) 34.0-34.9, adult: Secondary | ICD-10-CM | POA: Diagnosis not present

## 2014-08-06 DIAGNOSIS — I509 Heart failure, unspecified: Secondary | ICD-10-CM | POA: Diagnosis not present

## 2014-08-06 DIAGNOSIS — J449 Chronic obstructive pulmonary disease, unspecified: Secondary | ICD-10-CM | POA: Diagnosis not present

## 2014-08-06 DIAGNOSIS — G8929 Other chronic pain: Secondary | ICD-10-CM | POA: Diagnosis not present

## 2014-08-06 DIAGNOSIS — I10 Essential (primary) hypertension: Secondary | ICD-10-CM | POA: Diagnosis not present

## 2014-09-07 DIAGNOSIS — I509 Heart failure, unspecified: Secondary | ICD-10-CM | POA: Diagnosis not present

## 2014-09-07 DIAGNOSIS — J449 Chronic obstructive pulmonary disease, unspecified: Secondary | ICD-10-CM | POA: Diagnosis not present

## 2014-09-07 DIAGNOSIS — I10 Essential (primary) hypertension: Secondary | ICD-10-CM | POA: Diagnosis not present

## 2014-09-07 DIAGNOSIS — E1129 Type 2 diabetes mellitus with other diabetic kidney complication: Secondary | ICD-10-CM | POA: Diagnosis not present

## 2014-09-07 DIAGNOSIS — I4891 Unspecified atrial fibrillation: Secondary | ICD-10-CM | POA: Diagnosis not present

## 2014-09-07 DIAGNOSIS — Z6834 Body mass index (BMI) 34.0-34.9, adult: Secondary | ICD-10-CM | POA: Diagnosis not present

## 2014-09-07 DIAGNOSIS — G8929 Other chronic pain: Secondary | ICD-10-CM | POA: Diagnosis not present

## 2014-09-07 DIAGNOSIS — K088 Other specified disorders of teeth and supporting structures: Secondary | ICD-10-CM | POA: Diagnosis not present

## 2014-09-28 DIAGNOSIS — K088 Other specified disorders of teeth and supporting structures: Secondary | ICD-10-CM | POA: Diagnosis not present

## 2014-09-28 DIAGNOSIS — G8929 Other chronic pain: Secondary | ICD-10-CM | POA: Diagnosis not present

## 2014-09-28 DIAGNOSIS — Z6834 Body mass index (BMI) 34.0-34.9, adult: Secondary | ICD-10-CM | POA: Diagnosis not present

## 2014-10-29 DIAGNOSIS — G8929 Other chronic pain: Secondary | ICD-10-CM | POA: Diagnosis not present

## 2014-10-29 DIAGNOSIS — Z1389 Encounter for screening for other disorder: Secondary | ICD-10-CM | POA: Diagnosis not present

## 2014-10-29 DIAGNOSIS — Z6834 Body mass index (BMI) 34.0-34.9, adult: Secondary | ICD-10-CM | POA: Diagnosis not present

## 2014-10-29 DIAGNOSIS — Z79899 Other long term (current) drug therapy: Secondary | ICD-10-CM | POA: Diagnosis not present

## 2014-11-30 DIAGNOSIS — I4891 Unspecified atrial fibrillation: Secondary | ICD-10-CM | POA: Diagnosis not present

## 2014-11-30 DIAGNOSIS — Z1389 Encounter for screening for other disorder: Secondary | ICD-10-CM | POA: Diagnosis not present

## 2014-11-30 DIAGNOSIS — R42 Dizziness and giddiness: Secondary | ICD-10-CM | POA: Diagnosis not present

## 2014-11-30 DIAGNOSIS — Z6833 Body mass index (BMI) 33.0-33.9, adult: Secondary | ICD-10-CM | POA: Diagnosis not present

## 2014-11-30 DIAGNOSIS — G8929 Other chronic pain: Secondary | ICD-10-CM | POA: Diagnosis not present

## 2014-12-02 DIAGNOSIS — I482 Chronic atrial fibrillation, unspecified: Secondary | ICD-10-CM

## 2014-12-02 DIAGNOSIS — I5022 Chronic systolic (congestive) heart failure: Secondary | ICD-10-CM

## 2014-12-02 DIAGNOSIS — I472 Ventricular tachycardia, unspecified: Secondary | ICD-10-CM

## 2014-12-02 DIAGNOSIS — I420 Dilated cardiomyopathy: Secondary | ICD-10-CM

## 2014-12-02 HISTORY — DX: Chronic atrial fibrillation, unspecified: I48.20

## 2014-12-02 HISTORY — DX: Ventricular tachycardia: I47.2

## 2014-12-02 HISTORY — DX: Ventricular tachycardia, unspecified: I47.20

## 2014-12-02 HISTORY — DX: Chronic systolic (congestive) heart failure: I50.22

## 2014-12-02 HISTORY — DX: Dilated cardiomyopathy: I42.0

## 2014-12-03 DIAGNOSIS — N183 Chronic kidney disease, stage 3 unspecified: Secondary | ICD-10-CM

## 2014-12-03 DIAGNOSIS — I5022 Chronic systolic (congestive) heart failure: Secondary | ICD-10-CM | POA: Diagnosis not present

## 2014-12-03 DIAGNOSIS — I472 Ventricular tachycardia: Secondary | ICD-10-CM | POA: Diagnosis not present

## 2014-12-03 DIAGNOSIS — Z95810 Presence of automatic (implantable) cardiac defibrillator: Secondary | ICD-10-CM

## 2014-12-03 DIAGNOSIS — I482 Chronic atrial fibrillation: Secondary | ICD-10-CM | POA: Diagnosis not present

## 2014-12-03 DIAGNOSIS — I420 Dilated cardiomyopathy: Secondary | ICD-10-CM | POA: Diagnosis not present

## 2014-12-03 HISTORY — DX: Chronic kidney disease, stage 3 unspecified: N18.30

## 2014-12-03 HISTORY — DX: Presence of automatic (implantable) cardiac defibrillator: Z95.810

## 2014-12-31 DIAGNOSIS — E1129 Type 2 diabetes mellitus with other diabetic kidney complication: Secondary | ICD-10-CM | POA: Diagnosis not present

## 2014-12-31 DIAGNOSIS — G8929 Other chronic pain: Secondary | ICD-10-CM | POA: Diagnosis not present

## 2014-12-31 DIAGNOSIS — N289 Disorder of kidney and ureter, unspecified: Secondary | ICD-10-CM | POA: Diagnosis not present

## 2014-12-31 DIAGNOSIS — D539 Nutritional anemia, unspecified: Secondary | ICD-10-CM | POA: Diagnosis not present

## 2014-12-31 DIAGNOSIS — Z6835 Body mass index (BMI) 35.0-35.9, adult: Secondary | ICD-10-CM | POA: Diagnosis not present

## 2015-01-29 DIAGNOSIS — E785 Hyperlipidemia, unspecified: Secondary | ICD-10-CM | POA: Diagnosis not present

## 2015-01-29 DIAGNOSIS — D539 Nutritional anemia, unspecified: Secondary | ICD-10-CM | POA: Diagnosis not present

## 2015-01-29 DIAGNOSIS — N289 Disorder of kidney and ureter, unspecified: Secondary | ICD-10-CM | POA: Diagnosis not present

## 2015-01-29 DIAGNOSIS — E1129 Type 2 diabetes mellitus with other diabetic kidney complication: Secondary | ICD-10-CM | POA: Diagnosis not present

## 2015-01-29 DIAGNOSIS — Z79891 Long term (current) use of opiate analgesic: Secondary | ICD-10-CM | POA: Diagnosis not present

## 2015-01-29 DIAGNOSIS — Z6835 Body mass index (BMI) 35.0-35.9, adult: Secondary | ICD-10-CM | POA: Diagnosis not present

## 2015-01-29 DIAGNOSIS — G8929 Other chronic pain: Secondary | ICD-10-CM | POA: Diagnosis not present

## 2015-03-30 DIAGNOSIS — Z9181 History of falling: Secondary | ICD-10-CM | POA: Diagnosis not present

## 2015-03-30 DIAGNOSIS — D539 Nutritional anemia, unspecified: Secondary | ICD-10-CM | POA: Diagnosis not present

## 2015-03-30 DIAGNOSIS — Z23 Encounter for immunization: Secondary | ICD-10-CM | POA: Diagnosis not present

## 2015-03-30 DIAGNOSIS — N289 Disorder of kidney and ureter, unspecified: Secondary | ICD-10-CM | POA: Diagnosis not present

## 2015-03-30 DIAGNOSIS — Z1389 Encounter for screening for other disorder: Secondary | ICD-10-CM | POA: Diagnosis not present

## 2015-03-30 DIAGNOSIS — Z6835 Body mass index (BMI) 35.0-35.9, adult: Secondary | ICD-10-CM | POA: Diagnosis not present

## 2015-05-24 DIAGNOSIS — E1129 Type 2 diabetes mellitus with other diabetic kidney complication: Secondary | ICD-10-CM | POA: Diagnosis not present

## 2015-05-24 DIAGNOSIS — E785 Hyperlipidemia, unspecified: Secondary | ICD-10-CM | POA: Diagnosis not present

## 2015-05-24 DIAGNOSIS — E669 Obesity, unspecified: Secondary | ICD-10-CM | POA: Diagnosis not present

## 2015-05-24 DIAGNOSIS — I10 Essential (primary) hypertension: Secondary | ICD-10-CM | POA: Diagnosis not present

## 2015-05-24 DIAGNOSIS — N289 Disorder of kidney and ureter, unspecified: Secondary | ICD-10-CM | POA: Diagnosis not present

## 2015-05-24 DIAGNOSIS — D539 Nutritional anemia, unspecified: Secondary | ICD-10-CM | POA: Diagnosis not present

## 2015-05-24 DIAGNOSIS — Z6837 Body mass index (BMI) 37.0-37.9, adult: Secondary | ICD-10-CM | POA: Diagnosis not present

## 2015-06-07 DIAGNOSIS — Z95810 Presence of automatic (implantable) cardiac defibrillator: Secondary | ICD-10-CM | POA: Diagnosis not present

## 2015-06-07 DIAGNOSIS — I5022 Chronic systolic (congestive) heart failure: Secondary | ICD-10-CM | POA: Diagnosis not present

## 2015-06-07 DIAGNOSIS — N183 Chronic kidney disease, stage 3 (moderate): Secondary | ICD-10-CM | POA: Diagnosis not present

## 2015-06-07 DIAGNOSIS — I482 Chronic atrial fibrillation: Secondary | ICD-10-CM | POA: Diagnosis not present

## 2015-06-07 DIAGNOSIS — I420 Dilated cardiomyopathy: Secondary | ICD-10-CM | POA: Diagnosis not present

## 2015-06-07 DIAGNOSIS — I472 Ventricular tachycardia: Secondary | ICD-10-CM | POA: Diagnosis not present

## 2015-06-10 DIAGNOSIS — Z4502 Encounter for adjustment and management of automatic implantable cardiac defibrillator: Secondary | ICD-10-CM | POA: Diagnosis not present

## 2015-06-10 DIAGNOSIS — I420 Dilated cardiomyopathy: Secondary | ICD-10-CM | POA: Diagnosis not present

## 2015-06-24 DIAGNOSIS — I5022 Chronic systolic (congestive) heart failure: Secondary | ICD-10-CM | POA: Diagnosis not present

## 2015-06-24 DIAGNOSIS — I420 Dilated cardiomyopathy: Secondary | ICD-10-CM | POA: Diagnosis not present

## 2015-07-26 DIAGNOSIS — Z6837 Body mass index (BMI) 37.0-37.9, adult: Secondary | ICD-10-CM | POA: Diagnosis not present

## 2015-07-26 DIAGNOSIS — E1122 Type 2 diabetes mellitus with diabetic chronic kidney disease: Secondary | ICD-10-CM | POA: Diagnosis not present

## 2015-09-06 DIAGNOSIS — M10071 Idiopathic gout, right ankle and foot: Secondary | ICD-10-CM | POA: Diagnosis not present

## 2016-06-26 DIAGNOSIS — N289 Disorder of kidney and ureter, unspecified: Secondary | ICD-10-CM | POA: Diagnosis not present

## 2016-06-26 DIAGNOSIS — R05 Cough: Secondary | ICD-10-CM | POA: Diagnosis not present

## 2016-06-26 DIAGNOSIS — I959 Hypotension, unspecified: Secondary | ICD-10-CM | POA: Diagnosis not present

## 2016-07-10 DIAGNOSIS — R05 Cough: Secondary | ICD-10-CM | POA: Diagnosis not present

## 2016-07-10 DIAGNOSIS — I959 Hypotension, unspecified: Secondary | ICD-10-CM | POA: Diagnosis not present

## 2016-07-10 DIAGNOSIS — N289 Disorder of kidney and ureter, unspecified: Secondary | ICD-10-CM | POA: Diagnosis not present

## 2016-08-10 DIAGNOSIS — I509 Heart failure, unspecified: Secondary | ICD-10-CM | POA: Diagnosis not present

## 2016-08-10 DIAGNOSIS — D539 Nutritional anemia, unspecified: Secondary | ICD-10-CM | POA: Diagnosis not present

## 2016-08-10 DIAGNOSIS — M109 Gout, unspecified: Secondary | ICD-10-CM | POA: Diagnosis not present

## 2016-08-10 DIAGNOSIS — E785 Hyperlipidemia, unspecified: Secondary | ICD-10-CM | POA: Diagnosis not present

## 2016-08-10 DIAGNOSIS — J449 Chronic obstructive pulmonary disease, unspecified: Secondary | ICD-10-CM | POA: Diagnosis not present

## 2016-08-10 DIAGNOSIS — E1122 Type 2 diabetes mellitus with diabetic chronic kidney disease: Secondary | ICD-10-CM | POA: Diagnosis not present

## 2016-08-10 DIAGNOSIS — I10 Essential (primary) hypertension: Secondary | ICD-10-CM | POA: Diagnosis not present

## 2016-08-21 DIAGNOSIS — E1122 Type 2 diabetes mellitus with diabetic chronic kidney disease: Secondary | ICD-10-CM | POA: Diagnosis not present

## 2016-08-21 DIAGNOSIS — I10 Essential (primary) hypertension: Secondary | ICD-10-CM | POA: Diagnosis not present

## 2016-08-24 DIAGNOSIS — R069 Unspecified abnormalities of breathing: Secondary | ICD-10-CM | POA: Diagnosis not present

## 2016-08-24 DIAGNOSIS — I110 Hypertensive heart disease with heart failure: Secondary | ICD-10-CM | POA: Diagnosis not present

## 2016-08-24 DIAGNOSIS — R0602 Shortness of breath: Secondary | ICD-10-CM | POA: Diagnosis not present

## 2016-08-24 DIAGNOSIS — R05 Cough: Secondary | ICD-10-CM | POA: Diagnosis not present

## 2016-08-24 DIAGNOSIS — I509 Heart failure, unspecified: Secondary | ICD-10-CM | POA: Diagnosis not present

## 2016-09-04 DIAGNOSIS — I10 Essential (primary) hypertension: Secondary | ICD-10-CM | POA: Diagnosis not present

## 2016-09-04 DIAGNOSIS — I509 Heart failure, unspecified: Secondary | ICD-10-CM | POA: Diagnosis not present

## 2016-09-23 DIAGNOSIS — M5441 Lumbago with sciatica, right side: Secondary | ICD-10-CM | POA: Diagnosis not present

## 2016-09-23 DIAGNOSIS — M79604 Pain in right leg: Secondary | ICD-10-CM | POA: Diagnosis not present

## 2016-09-23 DIAGNOSIS — M5431 Sciatica, right side: Secondary | ICD-10-CM | POA: Diagnosis not present

## 2016-09-23 DIAGNOSIS — R52 Pain, unspecified: Secondary | ICD-10-CM | POA: Diagnosis not present

## 2016-09-23 DIAGNOSIS — M5416 Radiculopathy, lumbar region: Secondary | ICD-10-CM | POA: Diagnosis not present

## 2016-10-29 DIAGNOSIS — E119 Type 2 diabetes mellitus without complications: Secondary | ICD-10-CM | POA: Diagnosis not present

## 2016-10-29 DIAGNOSIS — I130 Hypertensive heart and chronic kidney disease with heart failure and stage 1 through stage 4 chronic kidney disease, or unspecified chronic kidney disease: Secondary | ICD-10-CM | POA: Diagnosis not present

## 2016-10-29 DIAGNOSIS — I4891 Unspecified atrial fibrillation: Secondary | ICD-10-CM | POA: Diagnosis not present

## 2016-10-29 DIAGNOSIS — I10 Essential (primary) hypertension: Secondary | ICD-10-CM | POA: Diagnosis not present

## 2016-10-29 DIAGNOSIS — I517 Cardiomegaly: Secondary | ICD-10-CM | POA: Diagnosis not present

## 2016-10-29 DIAGNOSIS — Z95810 Presence of automatic (implantable) cardiac defibrillator: Secondary | ICD-10-CM | POA: Diagnosis not present

## 2016-10-29 DIAGNOSIS — Z79899 Other long term (current) drug therapy: Secondary | ICD-10-CM | POA: Diagnosis not present

## 2016-10-29 DIAGNOSIS — R000 Tachycardia, unspecified: Secondary | ICD-10-CM | POA: Diagnosis not present

## 2016-10-29 DIAGNOSIS — Z87891 Personal history of nicotine dependence: Secondary | ICD-10-CM | POA: Diagnosis not present

## 2016-10-29 DIAGNOSIS — M7989 Other specified soft tissue disorders: Secondary | ICD-10-CM | POA: Diagnosis not present

## 2016-10-29 DIAGNOSIS — I509 Heart failure, unspecified: Secondary | ICD-10-CM | POA: Diagnosis not present

## 2016-10-29 DIAGNOSIS — I5023 Acute on chronic systolic (congestive) heart failure: Secondary | ICD-10-CM | POA: Diagnosis not present

## 2016-10-29 DIAGNOSIS — E1122 Type 2 diabetes mellitus with diabetic chronic kidney disease: Secondary | ICD-10-CM | POA: Diagnosis not present

## 2016-10-29 DIAGNOSIS — M79605 Pain in left leg: Secondary | ICD-10-CM | POA: Diagnosis not present

## 2016-10-29 DIAGNOSIS — Z7984 Long term (current) use of oral hypoglycemic drugs: Secondary | ICD-10-CM | POA: Diagnosis not present

## 2016-10-29 DIAGNOSIS — M109 Gout, unspecified: Secondary | ICD-10-CM | POA: Diagnosis not present

## 2016-10-29 DIAGNOSIS — I5043 Acute on chronic combined systolic (congestive) and diastolic (congestive) heart failure: Secondary | ICD-10-CM | POA: Diagnosis not present

## 2016-10-29 DIAGNOSIS — R52 Pain, unspecified: Secondary | ICD-10-CM | POA: Diagnosis not present

## 2016-10-29 DIAGNOSIS — I420 Dilated cardiomyopathy: Secondary | ICD-10-CM | POA: Diagnosis not present

## 2016-10-29 DIAGNOSIS — M79672 Pain in left foot: Secondary | ICD-10-CM | POA: Diagnosis not present

## 2016-10-29 DIAGNOSIS — Z7901 Long term (current) use of anticoagulants: Secondary | ICD-10-CM | POA: Diagnosis not present

## 2016-10-29 DIAGNOSIS — I110 Hypertensive heart disease with heart failure: Secondary | ICD-10-CM | POA: Diagnosis not present

## 2016-10-29 DIAGNOSIS — R0600 Dyspnea, unspecified: Secondary | ICD-10-CM | POA: Diagnosis not present

## 2016-10-29 DIAGNOSIS — I447 Left bundle-branch block, unspecified: Secondary | ICD-10-CM | POA: Diagnosis not present

## 2016-10-29 DIAGNOSIS — N189 Chronic kidney disease, unspecified: Secondary | ICD-10-CM | POA: Diagnosis not present

## 2016-10-29 DIAGNOSIS — Z9119 Patient's noncompliance with other medical treatment and regimen: Secondary | ICD-10-CM | POA: Diagnosis not present

## 2016-10-29 DIAGNOSIS — I482 Chronic atrial fibrillation: Secondary | ICD-10-CM | POA: Diagnosis not present

## 2016-10-29 DIAGNOSIS — I340 Nonrheumatic mitral (valve) insufficiency: Secondary | ICD-10-CM | POA: Diagnosis not present

## 2016-11-02 DIAGNOSIS — I5023 Acute on chronic systolic (congestive) heart failure: Secondary | ICD-10-CM | POA: Diagnosis not present

## 2016-11-02 DIAGNOSIS — M109 Gout, unspecified: Secondary | ICD-10-CM | POA: Diagnosis not present

## 2016-11-02 DIAGNOSIS — I482 Chronic atrial fibrillation: Secondary | ICD-10-CM | POA: Diagnosis not present

## 2016-11-02 DIAGNOSIS — J439 Emphysema, unspecified: Secondary | ICD-10-CM | POA: Diagnosis not present

## 2016-11-02 DIAGNOSIS — I130 Hypertensive heart and chronic kidney disease with heart failure and stage 1 through stage 4 chronic kidney disease, or unspecified chronic kidney disease: Secondary | ICD-10-CM | POA: Diagnosis not present

## 2016-11-02 DIAGNOSIS — M1991 Primary osteoarthritis, unspecified site: Secondary | ICD-10-CM | POA: Diagnosis not present

## 2016-11-02 DIAGNOSIS — J449 Chronic obstructive pulmonary disease, unspecified: Secondary | ICD-10-CM | POA: Diagnosis not present

## 2016-11-02 DIAGNOSIS — Z7951 Long term (current) use of inhaled steroids: Secondary | ICD-10-CM | POA: Diagnosis not present

## 2016-11-02 DIAGNOSIS — E1122 Type 2 diabetes mellitus with diabetic chronic kidney disease: Secondary | ICD-10-CM | POA: Diagnosis not present

## 2016-11-02 DIAGNOSIS — Z7984 Long term (current) use of oral hypoglycemic drugs: Secondary | ICD-10-CM | POA: Diagnosis not present

## 2016-11-02 DIAGNOSIS — Z87891 Personal history of nicotine dependence: Secondary | ICD-10-CM | POA: Diagnosis not present

## 2016-11-02 DIAGNOSIS — I480 Paroxysmal atrial fibrillation: Secondary | ICD-10-CM | POA: Diagnosis not present

## 2016-11-02 DIAGNOSIS — Z7901 Long term (current) use of anticoagulants: Secondary | ICD-10-CM | POA: Diagnosis not present

## 2016-11-02 DIAGNOSIS — I5022 Chronic systolic (congestive) heart failure: Secondary | ICD-10-CM | POA: Diagnosis not present

## 2016-11-02 DIAGNOSIS — Z7952 Long term (current) use of systemic steroids: Secondary | ICD-10-CM | POA: Diagnosis not present

## 2016-11-02 DIAGNOSIS — N189 Chronic kidney disease, unspecified: Secondary | ICD-10-CM | POA: Diagnosis not present

## 2016-11-02 DIAGNOSIS — Z9119 Patient's noncompliance with other medical treatment and regimen: Secondary | ICD-10-CM | POA: Diagnosis not present

## 2016-11-02 DIAGNOSIS — I252 Old myocardial infarction: Secondary | ICD-10-CM | POA: Diagnosis not present

## 2016-11-02 DIAGNOSIS — Z95810 Presence of automatic (implantable) cardiac defibrillator: Secondary | ICD-10-CM | POA: Diagnosis not present

## 2016-11-02 DIAGNOSIS — Z794 Long term (current) use of insulin: Secondary | ICD-10-CM | POA: Diagnosis not present

## 2016-11-06 DIAGNOSIS — N189 Chronic kidney disease, unspecified: Secondary | ICD-10-CM | POA: Diagnosis not present

## 2016-11-06 DIAGNOSIS — I5023 Acute on chronic systolic (congestive) heart failure: Secondary | ICD-10-CM | POA: Diagnosis not present

## 2016-11-06 DIAGNOSIS — I5022 Chronic systolic (congestive) heart failure: Secondary | ICD-10-CM | POA: Diagnosis not present

## 2016-11-06 DIAGNOSIS — Z7951 Long term (current) use of inhaled steroids: Secondary | ICD-10-CM | POA: Diagnosis not present

## 2016-11-06 DIAGNOSIS — I509 Heart failure, unspecified: Secondary | ICD-10-CM | POA: Diagnosis not present

## 2016-11-06 DIAGNOSIS — Z9119 Patient's noncompliance with other medical treatment and regimen: Secondary | ICD-10-CM | POA: Diagnosis not present

## 2016-11-06 DIAGNOSIS — Z7901 Long term (current) use of anticoagulants: Secondary | ICD-10-CM | POA: Diagnosis not present

## 2016-11-06 DIAGNOSIS — Z7952 Long term (current) use of systemic steroids: Secondary | ICD-10-CM | POA: Diagnosis not present

## 2016-11-06 DIAGNOSIS — I130 Hypertensive heart and chronic kidney disease with heart failure and stage 1 through stage 4 chronic kidney disease, or unspecified chronic kidney disease: Secondary | ICD-10-CM | POA: Diagnosis not present

## 2016-11-06 DIAGNOSIS — I482 Chronic atrial fibrillation: Secondary | ICD-10-CM | POA: Diagnosis not present

## 2016-11-06 DIAGNOSIS — J439 Emphysema, unspecified: Secondary | ICD-10-CM | POA: Diagnosis not present

## 2016-11-06 DIAGNOSIS — Z7984 Long term (current) use of oral hypoglycemic drugs: Secondary | ICD-10-CM | POA: Diagnosis not present

## 2016-11-06 DIAGNOSIS — J449 Chronic obstructive pulmonary disease, unspecified: Secondary | ICD-10-CM | POA: Diagnosis not present

## 2016-11-06 DIAGNOSIS — Z139 Encounter for screening, unspecified: Secondary | ICD-10-CM | POA: Diagnosis not present

## 2016-11-06 DIAGNOSIS — Z794 Long term (current) use of insulin: Secondary | ICD-10-CM | POA: Diagnosis not present

## 2016-11-06 DIAGNOSIS — E1122 Type 2 diabetes mellitus with diabetic chronic kidney disease: Secondary | ICD-10-CM | POA: Diagnosis not present

## 2016-11-06 DIAGNOSIS — Z95810 Presence of automatic (implantable) cardiac defibrillator: Secondary | ICD-10-CM | POA: Diagnosis not present

## 2016-11-06 DIAGNOSIS — M1991 Primary osteoarthritis, unspecified site: Secondary | ICD-10-CM | POA: Diagnosis not present

## 2016-11-06 DIAGNOSIS — M109 Gout, unspecified: Secondary | ICD-10-CM | POA: Diagnosis not present

## 2016-11-06 DIAGNOSIS — I480 Paroxysmal atrial fibrillation: Secondary | ICD-10-CM | POA: Diagnosis not present

## 2016-11-06 DIAGNOSIS — I252 Old myocardial infarction: Secondary | ICD-10-CM | POA: Diagnosis not present

## 2016-11-06 DIAGNOSIS — N289 Disorder of kidney and ureter, unspecified: Secondary | ICD-10-CM | POA: Diagnosis not present

## 2016-11-06 DIAGNOSIS — Z87891 Personal history of nicotine dependence: Secondary | ICD-10-CM | POA: Diagnosis not present

## 2016-11-09 DIAGNOSIS — I482 Chronic atrial fibrillation: Secondary | ICD-10-CM | POA: Diagnosis not present

## 2016-11-09 DIAGNOSIS — I130 Hypertensive heart and chronic kidney disease with heart failure and stage 1 through stage 4 chronic kidney disease, or unspecified chronic kidney disease: Secondary | ICD-10-CM | POA: Diagnosis not present

## 2016-11-09 DIAGNOSIS — M1991 Primary osteoarthritis, unspecified site: Secondary | ICD-10-CM | POA: Diagnosis not present

## 2016-11-09 DIAGNOSIS — Z87891 Personal history of nicotine dependence: Secondary | ICD-10-CM | POA: Diagnosis not present

## 2016-11-09 DIAGNOSIS — I5023 Acute on chronic systolic (congestive) heart failure: Secondary | ICD-10-CM | POA: Diagnosis not present

## 2016-11-09 DIAGNOSIS — I5022 Chronic systolic (congestive) heart failure: Secondary | ICD-10-CM | POA: Diagnosis not present

## 2016-11-09 DIAGNOSIS — Z9119 Patient's noncompliance with other medical treatment and regimen: Secondary | ICD-10-CM | POA: Diagnosis not present

## 2016-11-09 DIAGNOSIS — Z7951 Long term (current) use of inhaled steroids: Secondary | ICD-10-CM | POA: Diagnosis not present

## 2016-11-09 DIAGNOSIS — Z7984 Long term (current) use of oral hypoglycemic drugs: Secondary | ICD-10-CM | POA: Diagnosis not present

## 2016-11-09 DIAGNOSIS — Z4502 Encounter for adjustment and management of automatic implantable cardiac defibrillator: Secondary | ICD-10-CM | POA: Diagnosis not present

## 2016-11-09 DIAGNOSIS — Z794 Long term (current) use of insulin: Secondary | ICD-10-CM | POA: Diagnosis not present

## 2016-11-09 DIAGNOSIS — Z7901 Long term (current) use of anticoagulants: Secondary | ICD-10-CM | POA: Diagnosis not present

## 2016-11-09 DIAGNOSIS — I480 Paroxysmal atrial fibrillation: Secondary | ICD-10-CM | POA: Diagnosis not present

## 2016-11-09 DIAGNOSIS — N189 Chronic kidney disease, unspecified: Secondary | ICD-10-CM | POA: Diagnosis not present

## 2016-11-09 DIAGNOSIS — J439 Emphysema, unspecified: Secondary | ICD-10-CM | POA: Diagnosis not present

## 2016-11-09 DIAGNOSIS — M109 Gout, unspecified: Secondary | ICD-10-CM | POA: Diagnosis not present

## 2016-11-09 DIAGNOSIS — E1122 Type 2 diabetes mellitus with diabetic chronic kidney disease: Secondary | ICD-10-CM | POA: Diagnosis not present

## 2016-11-09 DIAGNOSIS — J449 Chronic obstructive pulmonary disease, unspecified: Secondary | ICD-10-CM | POA: Diagnosis not present

## 2016-11-09 DIAGNOSIS — I252 Old myocardial infarction: Secondary | ICD-10-CM | POA: Diagnosis not present

## 2016-11-09 DIAGNOSIS — Z95810 Presence of automatic (implantable) cardiac defibrillator: Secondary | ICD-10-CM | POA: Diagnosis not present

## 2016-11-09 DIAGNOSIS — Z7952 Long term (current) use of systemic steroids: Secondary | ICD-10-CM | POA: Diagnosis not present

## 2016-11-09 DIAGNOSIS — I420 Dilated cardiomyopathy: Secondary | ICD-10-CM | POA: Diagnosis not present

## 2016-11-10 DIAGNOSIS — R0602 Shortness of breath: Secondary | ICD-10-CM | POA: Diagnosis not present

## 2016-11-10 DIAGNOSIS — N179 Acute kidney failure, unspecified: Secondary | ICD-10-CM | POA: Diagnosis not present

## 2016-11-10 DIAGNOSIS — E875 Hyperkalemia: Secondary | ICD-10-CM | POA: Diagnosis not present

## 2016-11-10 DIAGNOSIS — R531 Weakness: Secondary | ICD-10-CM | POA: Diagnosis not present

## 2016-11-10 DIAGNOSIS — R0902 Hypoxemia: Secondary | ICD-10-CM | POA: Diagnosis not present

## 2016-11-11 DIAGNOSIS — E872 Acidosis: Secondary | ICD-10-CM | POA: Diagnosis not present

## 2016-11-11 DIAGNOSIS — R0602 Shortness of breath: Secondary | ICD-10-CM | POA: Diagnosis not present

## 2016-11-11 DIAGNOSIS — I5022 Chronic systolic (congestive) heart failure: Secondary | ICD-10-CM | POA: Diagnosis not present

## 2016-11-11 DIAGNOSIS — E875 Hyperkalemia: Secondary | ICD-10-CM | POA: Diagnosis not present

## 2016-11-11 DIAGNOSIS — T464X5A Adverse effect of angiotensin-converting-enzyme inhibitors, initial encounter: Secondary | ICD-10-CM | POA: Diagnosis not present

## 2016-11-11 DIAGNOSIS — I4891 Unspecified atrial fibrillation: Secondary | ICD-10-CM | POA: Diagnosis not present

## 2016-11-11 DIAGNOSIS — Z79899 Other long term (current) drug therapy: Secondary | ICD-10-CM | POA: Diagnosis not present

## 2016-11-11 DIAGNOSIS — R918 Other nonspecific abnormal finding of lung field: Secondary | ICD-10-CM | POA: Diagnosis not present

## 2016-11-11 DIAGNOSIS — M1990 Unspecified osteoarthritis, unspecified site: Secondary | ICD-10-CM | POA: Diagnosis not present

## 2016-11-11 DIAGNOSIS — E119 Type 2 diabetes mellitus without complications: Secondary | ICD-10-CM | POA: Diagnosis not present

## 2016-11-11 DIAGNOSIS — I252 Old myocardial infarction: Secondary | ICD-10-CM | POA: Diagnosis not present

## 2016-11-11 DIAGNOSIS — Z950 Presence of cardiac pacemaker: Secondary | ICD-10-CM | POA: Diagnosis not present

## 2016-11-11 DIAGNOSIS — T503X5A Adverse effect of electrolytic, caloric and water-balance agents, initial encounter: Secondary | ICD-10-CM | POA: Diagnosis not present

## 2016-11-11 DIAGNOSIS — Z952 Presence of prosthetic heart valve: Secondary | ICD-10-CM | POA: Diagnosis not present

## 2016-11-11 DIAGNOSIS — I110 Hypertensive heart disease with heart failure: Secondary | ICD-10-CM | POA: Diagnosis not present

## 2016-11-11 DIAGNOSIS — T500X5A Adverse effect of mineralocorticoids and their antagonists, initial encounter: Secondary | ICD-10-CM | POA: Diagnosis not present

## 2016-11-11 DIAGNOSIS — Z87891 Personal history of nicotine dependence: Secondary | ICD-10-CM | POA: Diagnosis not present

## 2016-11-11 DIAGNOSIS — Z7902 Long term (current) use of antithrombotics/antiplatelets: Secondary | ICD-10-CM | POA: Diagnosis not present

## 2016-11-11 DIAGNOSIS — R5383 Other fatigue: Secondary | ICD-10-CM | POA: Diagnosis not present

## 2016-11-11 DIAGNOSIS — N179 Acute kidney failure, unspecified: Secondary | ICD-10-CM | POA: Diagnosis not present

## 2016-11-14 DIAGNOSIS — Z7952 Long term (current) use of systemic steroids: Secondary | ICD-10-CM | POA: Diagnosis not present

## 2016-11-14 DIAGNOSIS — Z87891 Personal history of nicotine dependence: Secondary | ICD-10-CM | POA: Diagnosis not present

## 2016-11-14 DIAGNOSIS — I130 Hypertensive heart and chronic kidney disease with heart failure and stage 1 through stage 4 chronic kidney disease, or unspecified chronic kidney disease: Secondary | ICD-10-CM | POA: Diagnosis not present

## 2016-11-14 DIAGNOSIS — N189 Chronic kidney disease, unspecified: Secondary | ICD-10-CM | POA: Diagnosis not present

## 2016-11-14 DIAGNOSIS — I482 Chronic atrial fibrillation: Secondary | ICD-10-CM | POA: Diagnosis not present

## 2016-11-14 DIAGNOSIS — Z9119 Patient's noncompliance with other medical treatment and regimen: Secondary | ICD-10-CM | POA: Diagnosis not present

## 2016-11-14 DIAGNOSIS — Z7901 Long term (current) use of anticoagulants: Secondary | ICD-10-CM | POA: Diagnosis not present

## 2016-11-14 DIAGNOSIS — M1991 Primary osteoarthritis, unspecified site: Secondary | ICD-10-CM | POA: Diagnosis not present

## 2016-11-14 DIAGNOSIS — I252 Old myocardial infarction: Secondary | ICD-10-CM | POA: Diagnosis not present

## 2016-11-14 DIAGNOSIS — J439 Emphysema, unspecified: Secondary | ICD-10-CM | POA: Diagnosis not present

## 2016-11-14 DIAGNOSIS — M109 Gout, unspecified: Secondary | ICD-10-CM | POA: Diagnosis not present

## 2016-11-14 DIAGNOSIS — Z95810 Presence of automatic (implantable) cardiac defibrillator: Secondary | ICD-10-CM | POA: Diagnosis not present

## 2016-11-14 DIAGNOSIS — Z794 Long term (current) use of insulin: Secondary | ICD-10-CM | POA: Diagnosis not present

## 2016-11-14 DIAGNOSIS — I5023 Acute on chronic systolic (congestive) heart failure: Secondary | ICD-10-CM | POA: Diagnosis not present

## 2016-11-14 DIAGNOSIS — Z7984 Long term (current) use of oral hypoglycemic drugs: Secondary | ICD-10-CM | POA: Diagnosis not present

## 2016-11-14 DIAGNOSIS — E1122 Type 2 diabetes mellitus with diabetic chronic kidney disease: Secondary | ICD-10-CM | POA: Diagnosis not present

## 2016-11-14 DIAGNOSIS — I480 Paroxysmal atrial fibrillation: Secondary | ICD-10-CM | POA: Diagnosis not present

## 2016-11-14 DIAGNOSIS — I5022 Chronic systolic (congestive) heart failure: Secondary | ICD-10-CM | POA: Diagnosis not present

## 2016-11-14 DIAGNOSIS — J449 Chronic obstructive pulmonary disease, unspecified: Secondary | ICD-10-CM | POA: Diagnosis not present

## 2016-11-14 DIAGNOSIS — Z7951 Long term (current) use of inhaled steroids: Secondary | ICD-10-CM | POA: Diagnosis not present

## 2016-11-16 DIAGNOSIS — Z7951 Long term (current) use of inhaled steroids: Secondary | ICD-10-CM | POA: Diagnosis not present

## 2016-11-16 DIAGNOSIS — I480 Paroxysmal atrial fibrillation: Secondary | ICD-10-CM | POA: Diagnosis not present

## 2016-11-16 DIAGNOSIS — I5023 Acute on chronic systolic (congestive) heart failure: Secondary | ICD-10-CM | POA: Diagnosis not present

## 2016-11-16 DIAGNOSIS — Z794 Long term (current) use of insulin: Secondary | ICD-10-CM | POA: Diagnosis not present

## 2016-11-16 DIAGNOSIS — M1991 Primary osteoarthritis, unspecified site: Secondary | ICD-10-CM | POA: Diagnosis not present

## 2016-11-16 DIAGNOSIS — Z7952 Long term (current) use of systemic steroids: Secondary | ICD-10-CM | POA: Diagnosis not present

## 2016-11-16 DIAGNOSIS — Z7901 Long term (current) use of anticoagulants: Secondary | ICD-10-CM | POA: Diagnosis not present

## 2016-11-16 DIAGNOSIS — J439 Emphysema, unspecified: Secondary | ICD-10-CM | POA: Diagnosis not present

## 2016-11-16 DIAGNOSIS — N189 Chronic kidney disease, unspecified: Secondary | ICD-10-CM | POA: Diagnosis not present

## 2016-11-16 DIAGNOSIS — Z87891 Personal history of nicotine dependence: Secondary | ICD-10-CM | POA: Diagnosis not present

## 2016-11-16 DIAGNOSIS — J449 Chronic obstructive pulmonary disease, unspecified: Secondary | ICD-10-CM | POA: Diagnosis not present

## 2016-11-16 DIAGNOSIS — Z95810 Presence of automatic (implantable) cardiac defibrillator: Secondary | ICD-10-CM | POA: Diagnosis not present

## 2016-11-16 DIAGNOSIS — I482 Chronic atrial fibrillation: Secondary | ICD-10-CM | POA: Diagnosis not present

## 2016-11-16 DIAGNOSIS — I130 Hypertensive heart and chronic kidney disease with heart failure and stage 1 through stage 4 chronic kidney disease, or unspecified chronic kidney disease: Secondary | ICD-10-CM | POA: Diagnosis not present

## 2016-11-16 DIAGNOSIS — Z7984 Long term (current) use of oral hypoglycemic drugs: Secondary | ICD-10-CM | POA: Diagnosis not present

## 2016-11-16 DIAGNOSIS — I5022 Chronic systolic (congestive) heart failure: Secondary | ICD-10-CM | POA: Diagnosis not present

## 2016-11-16 DIAGNOSIS — E1122 Type 2 diabetes mellitus with diabetic chronic kidney disease: Secondary | ICD-10-CM | POA: Diagnosis not present

## 2016-11-16 DIAGNOSIS — Z9119 Patient's noncompliance with other medical treatment and regimen: Secondary | ICD-10-CM | POA: Diagnosis not present

## 2016-11-16 DIAGNOSIS — I252 Old myocardial infarction: Secondary | ICD-10-CM | POA: Diagnosis not present

## 2016-11-16 DIAGNOSIS — M109 Gout, unspecified: Secondary | ICD-10-CM | POA: Diagnosis not present

## 2016-11-21 DIAGNOSIS — E875 Hyperkalemia: Secondary | ICD-10-CM | POA: Diagnosis not present

## 2016-11-21 DIAGNOSIS — Z139 Encounter for screening, unspecified: Secondary | ICD-10-CM | POA: Diagnosis not present

## 2016-11-21 DIAGNOSIS — I509 Heart failure, unspecified: Secondary | ICD-10-CM | POA: Diagnosis not present

## 2016-11-21 DIAGNOSIS — N289 Disorder of kidney and ureter, unspecified: Secondary | ICD-10-CM | POA: Diagnosis not present

## 2016-11-21 DIAGNOSIS — E1122 Type 2 diabetes mellitus with diabetic chronic kidney disease: Secondary | ICD-10-CM | POA: Diagnosis not present

## 2016-11-22 DIAGNOSIS — Z7951 Long term (current) use of inhaled steroids: Secondary | ICD-10-CM | POA: Diagnosis not present

## 2016-11-22 DIAGNOSIS — I5022 Chronic systolic (congestive) heart failure: Secondary | ICD-10-CM | POA: Diagnosis not present

## 2016-11-22 DIAGNOSIS — J439 Emphysema, unspecified: Secondary | ICD-10-CM | POA: Diagnosis not present

## 2016-11-22 DIAGNOSIS — J449 Chronic obstructive pulmonary disease, unspecified: Secondary | ICD-10-CM | POA: Diagnosis not present

## 2016-11-22 DIAGNOSIS — Z7952 Long term (current) use of systemic steroids: Secondary | ICD-10-CM | POA: Diagnosis not present

## 2016-11-22 DIAGNOSIS — M1991 Primary osteoarthritis, unspecified site: Secondary | ICD-10-CM | POA: Diagnosis not present

## 2016-11-22 DIAGNOSIS — Z7901 Long term (current) use of anticoagulants: Secondary | ICD-10-CM | POA: Diagnosis not present

## 2016-11-22 DIAGNOSIS — I252 Old myocardial infarction: Secondary | ICD-10-CM | POA: Diagnosis not present

## 2016-11-22 DIAGNOSIS — I480 Paroxysmal atrial fibrillation: Secondary | ICD-10-CM | POA: Diagnosis not present

## 2016-11-22 DIAGNOSIS — N189 Chronic kidney disease, unspecified: Secondary | ICD-10-CM | POA: Diagnosis not present

## 2016-11-22 DIAGNOSIS — Z95810 Presence of automatic (implantable) cardiac defibrillator: Secondary | ICD-10-CM | POA: Diagnosis not present

## 2016-11-22 DIAGNOSIS — Z87891 Personal history of nicotine dependence: Secondary | ICD-10-CM | POA: Diagnosis not present

## 2016-11-22 DIAGNOSIS — I130 Hypertensive heart and chronic kidney disease with heart failure and stage 1 through stage 4 chronic kidney disease, or unspecified chronic kidney disease: Secondary | ICD-10-CM | POA: Diagnosis not present

## 2016-11-22 DIAGNOSIS — E1122 Type 2 diabetes mellitus with diabetic chronic kidney disease: Secondary | ICD-10-CM | POA: Diagnosis not present

## 2016-11-22 DIAGNOSIS — M109 Gout, unspecified: Secondary | ICD-10-CM | POA: Diagnosis not present

## 2016-11-22 DIAGNOSIS — Z794 Long term (current) use of insulin: Secondary | ICD-10-CM | POA: Diagnosis not present

## 2016-11-24 ENCOUNTER — Other Ambulatory Visit: Payer: Self-pay | Admitting: *Deleted

## 2016-11-24 DIAGNOSIS — Z7952 Long term (current) use of systemic steroids: Secondary | ICD-10-CM | POA: Diagnosis not present

## 2016-11-24 DIAGNOSIS — J449 Chronic obstructive pulmonary disease, unspecified: Secondary | ICD-10-CM | POA: Diagnosis not present

## 2016-11-24 DIAGNOSIS — Z794 Long term (current) use of insulin: Secondary | ICD-10-CM | POA: Diagnosis not present

## 2016-11-24 DIAGNOSIS — Z7951 Long term (current) use of inhaled steroids: Secondary | ICD-10-CM | POA: Diagnosis not present

## 2016-11-24 DIAGNOSIS — E1122 Type 2 diabetes mellitus with diabetic chronic kidney disease: Secondary | ICD-10-CM | POA: Diagnosis not present

## 2016-11-24 DIAGNOSIS — M1991 Primary osteoarthritis, unspecified site: Secondary | ICD-10-CM | POA: Diagnosis not present

## 2016-11-24 DIAGNOSIS — M109 Gout, unspecified: Secondary | ICD-10-CM | POA: Diagnosis not present

## 2016-11-24 DIAGNOSIS — Z7901 Long term (current) use of anticoagulants: Secondary | ICD-10-CM | POA: Diagnosis not present

## 2016-11-24 DIAGNOSIS — I252 Old myocardial infarction: Secondary | ICD-10-CM | POA: Diagnosis not present

## 2016-11-24 DIAGNOSIS — I130 Hypertensive heart and chronic kidney disease with heart failure and stage 1 through stage 4 chronic kidney disease, or unspecified chronic kidney disease: Secondary | ICD-10-CM | POA: Diagnosis not present

## 2016-11-24 DIAGNOSIS — I5022 Chronic systolic (congestive) heart failure: Secondary | ICD-10-CM | POA: Diagnosis not present

## 2016-11-24 DIAGNOSIS — Z87891 Personal history of nicotine dependence: Secondary | ICD-10-CM | POA: Diagnosis not present

## 2016-11-24 DIAGNOSIS — I480 Paroxysmal atrial fibrillation: Secondary | ICD-10-CM | POA: Diagnosis not present

## 2016-11-24 DIAGNOSIS — J439 Emphysema, unspecified: Secondary | ICD-10-CM | POA: Diagnosis not present

## 2016-11-24 DIAGNOSIS — Z95810 Presence of automatic (implantable) cardiac defibrillator: Secondary | ICD-10-CM | POA: Diagnosis not present

## 2016-11-24 DIAGNOSIS — N189 Chronic kidney disease, unspecified: Secondary | ICD-10-CM | POA: Diagnosis not present

## 2016-11-24 NOTE — Patient Outreach (Signed)
[facility]) Care Management  11/24/2016  Iwan Aman 16-Aug-1945 [id number]   Telephone Screen  Referral Date: 11/23/16 Referral Source: [facility] High Risk Referral Referral Reason: High Risk Insurance: [facility] Medicare   Outreach attempt # 1 spoke with granddaughter [name]). She stated, Mr. Pangburn was in the shower. HIPAA compliant message left with [name] for patient to return call when available.    Plan: RNCM will contact patient within the next 3 business days.  Wynelle Cleveland, RN, BSN, MHA/MSL, [facility] Telephonic Care Manager Coordinator [facility] Direct Phone: [phone] Toll Free: [phone] Fax: [phone]

## 2016-11-29 ENCOUNTER — Other Ambulatory Visit: Payer: Self-pay | Admitting: *Deleted

## 2016-11-29 ENCOUNTER — Ambulatory Visit: Payer: Self-pay | Admitting: *Deleted

## 2016-11-29 DIAGNOSIS — Z87891 Personal history of nicotine dependence: Secondary | ICD-10-CM | POA: Diagnosis not present

## 2016-11-29 DIAGNOSIS — Z7951 Long term (current) use of inhaled steroids: Secondary | ICD-10-CM | POA: Diagnosis not present

## 2016-11-29 DIAGNOSIS — J449 Chronic obstructive pulmonary disease, unspecified: Secondary | ICD-10-CM | POA: Diagnosis not present

## 2016-11-29 DIAGNOSIS — Z7901 Long term (current) use of anticoagulants: Secondary | ICD-10-CM | POA: Diagnosis not present

## 2016-11-29 DIAGNOSIS — I252 Old myocardial infarction: Secondary | ICD-10-CM | POA: Diagnosis not present

## 2016-11-29 DIAGNOSIS — N189 Chronic kidney disease, unspecified: Secondary | ICD-10-CM | POA: Diagnosis not present

## 2016-11-29 DIAGNOSIS — E1122 Type 2 diabetes mellitus with diabetic chronic kidney disease: Secondary | ICD-10-CM | POA: Diagnosis not present

## 2016-11-29 DIAGNOSIS — Z794 Long term (current) use of insulin: Secondary | ICD-10-CM | POA: Diagnosis not present

## 2016-11-29 DIAGNOSIS — I5022 Chronic systolic (congestive) heart failure: Secondary | ICD-10-CM | POA: Diagnosis not present

## 2016-11-29 DIAGNOSIS — J439 Emphysema, unspecified: Secondary | ICD-10-CM | POA: Diagnosis not present

## 2016-11-29 DIAGNOSIS — M1991 Primary osteoarthritis, unspecified site: Secondary | ICD-10-CM | POA: Diagnosis not present

## 2016-11-29 DIAGNOSIS — Z7952 Long term (current) use of systemic steroids: Secondary | ICD-10-CM | POA: Diagnosis not present

## 2016-11-29 DIAGNOSIS — M109 Gout, unspecified: Secondary | ICD-10-CM | POA: Diagnosis not present

## 2016-11-29 DIAGNOSIS — I480 Paroxysmal atrial fibrillation: Secondary | ICD-10-CM | POA: Diagnosis not present

## 2016-11-29 DIAGNOSIS — Z95810 Presence of automatic (implantable) cardiac defibrillator: Secondary | ICD-10-CM | POA: Diagnosis not present

## 2016-11-29 DIAGNOSIS — I130 Hypertensive heart and chronic kidney disease with heart failure and stage 1 through stage 4 chronic kidney disease, or unspecified chronic kidney disease: Secondary | ICD-10-CM | POA: Diagnosis not present

## 2016-11-29 NOTE — Patient Outreach (Signed)
[facility]) Care Management  11/29/2016  Jaikob Ishimoto 1945/12/20 [id number]   Telephone Screen  Referral Date: 11/23/16 Referral Source: [facility] High Risk Referral Referral Reason: High Risk Insurance: [facility] Medicare   Outreach attempt # 2 spoke with granddaughter [name]). She stated, Mr. Woodworth was in the shower. HIPAA compliant message left with family for patient to return phone call..    Plan: RNCM will contact patient within the next 3 business days.  Wynelle Cleveland, RN, BSN, MHA/MSL, [facility] Telephonic Care Manager Coordinator [facility] Direct Phone: [phone] Toll Free: [phone] Fax: [phone]

## 2016-11-29 NOTE — Patient Outreach (Signed)
[facility]) Care Management  11/29/2016  Urban Chilcote 12/07/1945 [id number]  Telephone Screen  Referral Date: 11/23/16 Referral Source: [facility] High Risk Referral Referral Reason: High Risk Insurance: [facility] Medicare   Outreach attempt # 3 incoming call from patient. Spoke with patient regarding referral from [facility]. HIPAA verified with patient.  Social:  Patient lives with grandson. He is independent with all ADLs. Patient's grandson transports patient to all medical appointments. Patient or grandson were not able to provide the name of the [facility] company patient is active with. [facility] RN visits are scheduled once a week.  Conditions: Past Medical Hx: HTN, Atrial Fibrillation, CHF, CKD, Biventricular ICD, Cardiomyopathy Mr. Bozzuto reported being admitted to [facility] about 4 weeks ago. His grandson stated, patient was in the hospital for gout and fluid overload. Per MD notes, patient was in the hospital for CHF and rapid Afib. Patient stated, he has been admitted in the hospital several times this year.   Medications: The patient verbalized taking more than 10 medications per day. Patient reported being able to afford her medications and taking them as prescribed. The patient had no questions regarding his medications.   Appointments:  Patient has an appointment scheduled with his PCP on 12/02/16 with Gaye Alken, NP.   Advanced Directives: Pt reported not having an Advanced Directive. He declined to receive any information about Advanced Directives.   Consent: [facility] services reviewed and discussed with patient. Verbal consent for services given.   Plan: RN CM will send [facility] pharmacy referral for possible polypharmacy. RN CM advised patient to contact RNCM for any needs or concerns. RN CM advised patient to alert MD for any changes in conditions.  RN CM provided patient with [facility] 24hr Nurse Line contact info. RN CM will send referral to [facility] RN for further in home  eval/assessment of care needs and management of medical conditions.   Wynelle Cleveland, RN, BSN, MHA/MSL, [facility] Telephonic Care Manager Coordinator [facility] Direct Phone: [phone] Toll Free: [phone] Fax: [phone]

## 2016-11-30 ENCOUNTER — Other Ambulatory Visit: Payer: Self-pay | Admitting: *Deleted

## 2016-11-30 NOTE — Patient Outreach (Signed)
[facility]) Care Management  11/30/2016  Michael [name] 1945-10-28 [id number]   Referral received 6/13 for community case management  RN outreached to pt today and introduced the [facility] program however pt not able to talk as this time and requested another call back for tomorrow afternoon. Will report to coverage from Rowe Pavy, RN Bethel Born) to follow up with this pt for possible [facility] services.   Elliot Cousin, RN Care Management Coordinator [facility] Main Office [phone]

## 2016-12-04 ENCOUNTER — Ambulatory Visit: Payer: Self-pay | Admitting: *Deleted

## 2016-12-04 ENCOUNTER — Other Ambulatory Visit: Payer: Self-pay

## 2016-12-04 NOTE — Patient Outreach (Signed)
Care Coordination: Placed call to patient. Spoke with grandson due to patient difficulty hearing.  Offered home visit for tomorrow at 10   Plan: Home visit on 12/05/2016 at 10am. Confirmed address.   Rowe Pavy, RN, BSN, CEN [facility] [employer] [phone]

## 2016-12-05 ENCOUNTER — Other Ambulatory Visit: Payer: Self-pay

## 2016-12-05 DIAGNOSIS — E1122 Type 2 diabetes mellitus with diabetic chronic kidney disease: Secondary | ICD-10-CM | POA: Diagnosis not present

## 2016-12-05 DIAGNOSIS — Z139 Encounter for screening, unspecified: Secondary | ICD-10-CM | POA: Diagnosis not present

## 2016-12-05 DIAGNOSIS — I509 Heart failure, unspecified: Secondary | ICD-10-CM | POA: Diagnosis not present

## 2016-12-05 NOTE — Addendum Note (Signed)
Addended by: Rockne Menghini on: 12/05/2016 04:07 PM   Modules accepted: Orders

## 2016-12-05 NOTE — Patient Outreach (Signed)
[facility]) Care Management   12/05/2016  Jcion Buddenhagen [birth year] [id number]  Michael [name] is an 71 y.o. male  Subjective: Patient reports that he feels like he is managing well. Reports that he was in the hospital 1 month ago for dehydration from being on too much lasix.  Reports now he is doing better. Has a home health nurse 2 times per week.  Patient reports that he was recently started on insulin. States that he has not had lancet needles so he has not checked his CBG for 2 weeks.  He reports that MD called in the wrong needles. Reports that he is going to see primary MD today and will take his meter along with the correct lancet device.  Reports he is also out of 2 of his medications.  States that he will get refills today.  Reports that he weighs daily and follows a low salt diet. Reports that his grandson manages his medications.  Patient reports struggles with paying for medications. States out of pocket cost is between 30-40 dollars per month.  Reports needs for transportation and food resources.  States he also needs help with paying for medical bills. States that he owes his primary MD over 800.00 per month.   CHF: Patient states that he follows his low salt diet, takes his medications as prescribed. States that he keep a log of weights.  DM: reports following DM diet but not monitoring as instructed.   Objective:   cbg 89 Vitals:   12/05/16 1019  BP: 110/62  Pulse: 73  Resp: 20  SpO2: 95%  Weight: 184 lb (83.5 kg)  Height: 1.575 m (5\' 2" )   Review of Systems  Constitutional: Negative.   HENT: Negative.   Eyes: Negative.   Respiratory: Negative for cough and shortness of breath.   Cardiovascular: Negative for leg swelling.  Gastrointestinal: Negative.   Genitourinary: Negative.   Musculoskeletal: Negative.   Skin: Negative.   Neurological: Negative.   Endo/Heme/Allergies: Negative.   Psychiatric/Behavioral: Negative.     Physical Exam   Constitutional: He is oriented to person, place, and time. He appears well-developed and well-nourished.  Cardiovascular: Normal rate and intact distal pulses.   Respiratory: Effort normal and breath sounds normal.  GI: Soft. Bowel sounds are normal.  Musculoskeletal: Normal range of motion. He exhibits edema.  Trace edema to the lower legs.   Neurological: He is alert and oriented to person, place, and time.  2018, trump.  Awake and alert.   Skin: Skin is warm and dry.  Left foot with red area between 4th and 5th toe, red area between 1st and 2nd toe.   Psychiatric: He has a normal mood and affect. His behavior is normal. Judgment and thought content normal.    Encounter Medications:   Outpatient Encounter Prescriptions as of 12/05/2016  Medication Sig Note  . atorvastatin (LIPITOR) 20 MG tablet Take 20 mg by mouth daily.   . furosemide (LASIX) 40 MG tablet Take 40 mg by mouth daily.   . hydrALAZINE (APRESOLINE) 25 MG tablet Take 12.5 mg by mouth daily.   . insulin degludec (TRESIBA) 100 UNIT/ML SOPN FlexTouch Pen Inject 8 Units into the skin daily at 10 pm.   . lisinopril (PRINIVIL,ZESTRIL) 5 MG tablet Take 5 mg by mouth daily. 12/05/2016: Currently out of this medication  . metoprolol succinate (TOPROL-XL) 50 MG 24 hr tablet Take 50 mg by mouth 2 (two) times daily. Take with or immediately following a meal. 12/05/2016: Currently  out of this medication  . Rivaroxaban (XARELTO) 15 MG TABS tablet Take 15 mg by mouth daily.   Marland Kitchen spironolactone (ALDACTONE) 50 MG tablet Take 25 mg by mouth daily.   . traMADol-acetaminophen (ULTRACET) 37.5-325 MG tablet Take 1 tablet by mouth as needed.    No facility-administered encounter medications on file as of 12/05/2016.     Functional Status:   In your present state of health, do you have any difficulty performing the following activities: 12/05/2016  Hearing? Y  Vision? N  Difficulty concentrating or making decisions? N  Walking or climbing stairs?  N  Dressing or bathing? N  Doing errands, shopping? Y  Preparing Food and eating ? N  Using the Toilet? N  In the past six months, have you accidently leaked urine? N  Do you have problems with loss of bowel control? N  Managing your Medications? Y  Managing your Finances? Y  Housekeeping or managing your Housekeeping? N  Some recent data might be hidden    Fall/Depression Screening:    Fall Risk  12/05/2016 11/29/2016  Falls in the past year? Yes No   PHQ 2/9 Scores 12/05/2016 11/29/2016  PHQ - 2 Score 0 0    Assessment:   (1) Reviewed [facility] program. Provided new patient packet. Consent obtained. Reviewed 24 hour call a nurse line. Reviewed [facility] calendar. Provided my contact information. (2) CHF: Currently weighing daily. Currently following low salt diet. Reviewed CHF action plan. (3) DM: Not self monitoring due to lack of lancet device. Reports following DM diet. New to insulin. Today's CBG of 89.  (4) out of medications  (metoprolol and lisinopril) (5) reddened areas between toes on left foot.  (6) reports struggles with food, medication cost, transportation and medical bills.  Plan:  (1) consent scanned into chart.  (2)encouraged patient to continue to weigh daily and record in [facility] calendar. Reviewed importance of taking log to MD visits. Provided low salt diet tear off poster. Reviewed heart failure zones.  (3) Reviewed with patient to take meter and lancet devices to his MD appointment today. Provided patient with a temporary lancet device until he can get his needles. (4) reviewed importance of patient get medications refilled. Grandson and son will get refills requested at MD office today and get patient his medications. (5) Reviewed with patient the importance of observing his feet daily and reporting any sores to MD.  Reviewed with patient the reddened areas between his toes. Encouraged patient to discuss his feet with MD today.  (6) referrals placed to [facility] pharmacy and [facility]  social worker.   Care planning and goal setting during home visit. Primary goal is to obtain medications and lancet devices as soon as possible.   Next follow up planned for 2 weeks via phone.   [facility] CM Care Plan Problem One     Most Recent Value  Care Plan Problem One  Alteration on self management of chronic diseases.   Role Documenting the Problem One  Care Management Coordinator  Care Plan for Problem One  Active  [facility] Long Term Goal   Patient will report monitoring weight daily for the next 31 days.   [facility] Long Term Goal Start Date  12/05/16  Interventions for Problem One Long Term Goal  Reviewed importance of monitoring weight daily and reports weight gains to MD. Reviewed CHF zones and action plan.  Reviewed importance of follow low salt diet. Reviewed low salt diet tear off poster and how to read labels.  [facility] CM Short Term Goal #1   Patient will report understanding when to notify MD of increased weights in the next 2 weeks.  [facility] CM Short Term Goal #1 Start Date  12/05/16  Interventions for Short Term Goal #1  Reviewed weight log and where to record on [facility] calendar.  [facility] CM Short Term Goal #2   Patient will report taking all medications as prescribed in the next 14 days.   [facility] CM Short Term Goal #2 Start Date  12/05/16  Interventions for Short Term Goal #2  Reviewed importance of taking medications as prescribed.  Placed a pharmacy referral.   [facility] CM Short Term Goal #3  Patient will report getting lancet device to self monitor CBG in the next 14 days.   [facility] CM Short Term Goal #3 Start Date  12/05/16  Interventions for Short Tern Goal #3  Provided temporary lancet device. reviewed with patinet to take his device with him to his appointment today to get correct refill replacement for the multi click lancet.      This note sent to MD. Rowe Pavy, RN, BSN, CEN [facility] [place] Day Coordinator [phone]

## 2016-12-07 ENCOUNTER — Other Ambulatory Visit: Payer: Self-pay | Admitting: *Deleted

## 2016-12-07 DIAGNOSIS — Z87891 Personal history of nicotine dependence: Secondary | ICD-10-CM | POA: Diagnosis not present

## 2016-12-07 DIAGNOSIS — Z7901 Long term (current) use of anticoagulants: Secondary | ICD-10-CM | POA: Diagnosis not present

## 2016-12-07 DIAGNOSIS — I5022 Chronic systolic (congestive) heart failure: Secondary | ICD-10-CM | POA: Diagnosis not present

## 2016-12-07 DIAGNOSIS — J449 Chronic obstructive pulmonary disease, unspecified: Secondary | ICD-10-CM | POA: Diagnosis not present

## 2016-12-07 DIAGNOSIS — Z95810 Presence of automatic (implantable) cardiac defibrillator: Secondary | ICD-10-CM | POA: Diagnosis not present

## 2016-12-07 DIAGNOSIS — Z7952 Long term (current) use of systemic steroids: Secondary | ICD-10-CM | POA: Diagnosis not present

## 2016-12-07 DIAGNOSIS — Z794 Long term (current) use of insulin: Secondary | ICD-10-CM | POA: Diagnosis not present

## 2016-12-07 DIAGNOSIS — I130 Hypertensive heart and chronic kidney disease with heart failure and stage 1 through stage 4 chronic kidney disease, or unspecified chronic kidney disease: Secondary | ICD-10-CM | POA: Diagnosis not present

## 2016-12-07 DIAGNOSIS — N189 Chronic kidney disease, unspecified: Secondary | ICD-10-CM | POA: Diagnosis not present

## 2016-12-07 DIAGNOSIS — M1991 Primary osteoarthritis, unspecified site: Secondary | ICD-10-CM | POA: Diagnosis not present

## 2016-12-07 DIAGNOSIS — M109 Gout, unspecified: Secondary | ICD-10-CM | POA: Diagnosis not present

## 2016-12-07 DIAGNOSIS — E1122 Type 2 diabetes mellitus with diabetic chronic kidney disease: Secondary | ICD-10-CM | POA: Diagnosis not present

## 2016-12-07 DIAGNOSIS — I252 Old myocardial infarction: Secondary | ICD-10-CM | POA: Diagnosis not present

## 2016-12-07 DIAGNOSIS — Z7951 Long term (current) use of inhaled steroids: Secondary | ICD-10-CM | POA: Diagnosis not present

## 2016-12-07 DIAGNOSIS — I480 Paroxysmal atrial fibrillation: Secondary | ICD-10-CM | POA: Diagnosis not present

## 2016-12-07 DIAGNOSIS — J439 Emphysema, unspecified: Secondary | ICD-10-CM | POA: Diagnosis not present

## 2016-12-07 NOTE — Patient Outreach (Signed)
[facility]) Care Management  12/07/2016  Michael [name] [birth year] [id number]  CSW was able to make initial contact with patient today to perform phone assessment.  CSW introduced self, explained role and types of services provided through [employer] Care Management [facility] Care Management).  CSW further explained to patient that CSW works with patient's RNCM, also with [facility] Care Management, . CSW then explained the reason for the call, indicating that patient's RNCM thought that patient would benefit from social work services and resources to assist with .food, transportation and financial strains.  CSW obtained two HIPAA compliant identifiers from patient, which included patient's name and date of birth. Patient and son, [name], are open to CSW visit and will plan in home visit on 6/28.  Reece Levy, MSW, LCSW Clinical Social Worker  [facility] [employer] [phone]

## 2016-12-12 ENCOUNTER — Other Ambulatory Visit: Payer: Self-pay | Admitting: Pharmacist

## 2016-12-12 NOTE — Patient Outreach (Signed)
[facility]) Care Management  [facility]   12/12/2016  Geno Sydnor Jan 08, 1946 [id number]  Subjective:  Patient was referred to [facility] CM Pharmacist by [facility] RN Telephonic Dorann Lodge and [facility] RN Serita Grit for medication cost concerns.   Successful phone outreach to patient's grandson, [name], on [facility] Consent form, who reports he is presently managing patient's medications.  HIPAA details were verified.    Grandson reports patient obtains medications from [employer].  He is unsure exactly what insurance patient has.  He reports he doesn't have patient's insurance cards at time of call.   Patient's grandson was able to review patient's medications.  He reports he is setting up pill box for patient and denies difficulty filling it.    Objective:   Current Medications: Current Outpatient Prescriptions  Medication Sig Dispense Refill  . atorvastatin (LIPITOR) 20 MG tablet Take 20 mg by mouth daily.    . furosemide (LASIX) 40 MG tablet Take 40 mg by mouth daily.    . hydrALAZINE (APRESOLINE) 25 MG tablet Take 12.5 mg by mouth daily.    . insulin degludec (TRESIBA) 100 UNIT/ML SOPN FlexTouch Pen Inject 8 Units into the skin daily at 10 pm.    . lisinopril (PRINIVIL,ZESTRIL) 5 MG tablet Take 5 mg by mouth daily.    . metoprolol succinate (TOPROL-XL) 50 MG 24 hr tablet Take 50 mg by mouth 2 (two) times daily. Take with or immediately following a meal.    . Rivaroxaban (XARELTO) 15 MG TABS tablet Take 15 mg by mouth daily.    Marland Kitchen spironolactone (ALDACTONE) 50 MG tablet Take 25 mg by mouth daily.    . traMADol-acetaminophen (ULTRACET) 37.5-325 MG tablet Take 1 tablet by mouth as needed.     No current facility-administered medications for this visit.     Functional Status: In your present state of health, do you have any difficulty performing the following activities: 12/05/2016  Hearing? Y  Vision? N  Difficulty concentrating or making decisions? N  Walking or climbing  stairs? N  Dressing or bathing? N  Doing errands, shopping? Y  Preparing Food and eating ? N  Using the Toilet? N  In the past six months, have you accidently leaked urine? N  Do you have problems with loss of bowel control? N  Managing your Medications? Y  Managing your Finances? Y  Housekeeping or managing your Housekeeping? N  Some recent data might be hidden    Fall/Depression Screening: Fall Risk  12/05/2016 11/29/2016  Falls in the past year? Yes No   PHQ 2/9 Scores 12/05/2016 11/29/2016  PHQ - 2 Score 0 0    Assessment:  Drugs sorted by system:  Cardiovascular: -atorvastatin  -furosemide -hydralazine  -lisinopril -metoprolol succinate -rivaroxaban (Xarelto) -spironolactone  Endocrine: -insulin degludec Evaristo Bury)   Pain: -tramadol/acetaminophen   Other issues noted:  -Recommend close monitoring of patient's renal function and potassium as he is on multiple medications that affect potassium including:  Lisinopril, furosemide, spironolactone   Medication cost: Call placed to [facility]--was told co-pay for [place] and Xarelto was $8.35 each---this indicates patient may have Social Security Extra Help.    Called patient's grandson back---discussed if patient has SSA Extra Help options to lower medication cost further will be limited---will need grandson to get patient's insurance card to see if he has any cost savings opportunities for his medications.     Plan:  Spoke with [facility] LCSW who has home visit with patient this week.    Grandson agrees  to get patient's insurance card and [facility] Pharmacist will place outreach call to patient's grandson next week to help determine if there are any additional cost savings opportunities for patient.    Will route note to PCP.    Tommye Standard, PharmD, [facility] Clinical Pharmacist [facility] [phone]

## 2016-12-13 ENCOUNTER — Encounter: Payer: Self-pay | Admitting: Pharmacist

## 2016-12-13 DIAGNOSIS — N289 Disorder of kidney and ureter, unspecified: Secondary | ICD-10-CM | POA: Diagnosis not present

## 2016-12-14 ENCOUNTER — Other Ambulatory Visit: Payer: Self-pay | Admitting: *Deleted

## 2016-12-14 ENCOUNTER — Encounter: Payer: Self-pay | Admitting: *Deleted

## 2016-12-14 DIAGNOSIS — J439 Emphysema, unspecified: Secondary | ICD-10-CM | POA: Diagnosis not present

## 2016-12-14 DIAGNOSIS — Z794 Long term (current) use of insulin: Secondary | ICD-10-CM | POA: Diagnosis not present

## 2016-12-14 DIAGNOSIS — Z7951 Long term (current) use of inhaled steroids: Secondary | ICD-10-CM | POA: Diagnosis not present

## 2016-12-14 DIAGNOSIS — N189 Chronic kidney disease, unspecified: Secondary | ICD-10-CM | POA: Diagnosis not present

## 2016-12-14 DIAGNOSIS — M1991 Primary osteoarthritis, unspecified site: Secondary | ICD-10-CM | POA: Diagnosis not present

## 2016-12-14 DIAGNOSIS — I480 Paroxysmal atrial fibrillation: Secondary | ICD-10-CM | POA: Diagnosis not present

## 2016-12-14 DIAGNOSIS — I252 Old myocardial infarction: Secondary | ICD-10-CM | POA: Diagnosis not present

## 2016-12-14 DIAGNOSIS — Z87891 Personal history of nicotine dependence: Secondary | ICD-10-CM | POA: Diagnosis not present

## 2016-12-14 DIAGNOSIS — I5022 Chronic systolic (congestive) heart failure: Secondary | ICD-10-CM | POA: Diagnosis not present

## 2016-12-14 DIAGNOSIS — I130 Hypertensive heart and chronic kidney disease with heart failure and stage 1 through stage 4 chronic kidney disease, or unspecified chronic kidney disease: Secondary | ICD-10-CM | POA: Diagnosis not present

## 2016-12-14 DIAGNOSIS — Z7901 Long term (current) use of anticoagulants: Secondary | ICD-10-CM | POA: Diagnosis not present

## 2016-12-14 DIAGNOSIS — Z7952 Long term (current) use of systemic steroids: Secondary | ICD-10-CM | POA: Diagnosis not present

## 2016-12-14 DIAGNOSIS — E1122 Type 2 diabetes mellitus with diabetic chronic kidney disease: Secondary | ICD-10-CM | POA: Diagnosis not present

## 2016-12-14 DIAGNOSIS — J449 Chronic obstructive pulmonary disease, unspecified: Secondary | ICD-10-CM | POA: Diagnosis not present

## 2016-12-14 DIAGNOSIS — M109 Gout, unspecified: Secondary | ICD-10-CM | POA: Diagnosis not present

## 2016-12-14 DIAGNOSIS — Z95810 Presence of automatic (implantable) cardiac defibrillator: Secondary | ICD-10-CM | POA: Diagnosis not present

## 2016-12-14 NOTE — Patient Outreach (Signed)
[facility]) Care Management  [facility] Social Work  12/14/2016  Gene Glazebrook 1945/08/22 [id number]  Subjective:  "My [birth year] is next [birth year]"  Objective: CSW to assist patient with commuity based resources to aide in his well-being, quality of life and overall safety/needs.    Encounter Medications:  Outpatient Encounter Prescriptions as of 12/14/2016  Medication Sig Note  . furosemide (LASIX) 40 MG tablet Take 40 mg by mouth daily. 12/14/2016: Per grandson, PCP changed LASIX to take half of 40 mg tab one day and 40 mg the following day alternating this on-going.   Marland Kitchen atorvastatin (LIPITOR) 20 MG tablet Take 20 mg by mouth daily.   . hydrALAZINE (APRESOLINE) 25 MG tablet Take 12.5 mg by mouth 2 (two) times daily.    . insulin degludec (TRESIBA) 100 UNIT/ML SOPN FlexTouch Pen Inject 8 Units into the skin daily at 10 pm.   . lisinopril (PRINIVIL,ZESTRIL) 5 MG tablet Take 5 mg by mouth daily. 12/05/2016: Currently out of this medication  . metoprolol succinate (TOPROL-XL) 50 MG 24 hr tablet Take 50 mg by mouth 2 (two) times daily. Take with or immediately following a meal. 12/05/2016: Currently out of this medication  . Rivaroxaban (XARELTO) 15 MG TABS tablet Take 15 mg by mouth daily.   Marland Kitchen spironolactone (ALDACTONE) 50 MG tablet Take 25 mg by mouth daily.   . traMADol-acetaminophen (ULTRACET) 37.5-325 MG tablet Take 1 tablet by mouth as needed.    No facility-administered encounter medications on file as of 12/14/2016.     Functional Status:  In your present state of health, do you have any difficulty performing the following activities: 12/05/2016  Hearing? Y  Vision? N  Difficulty concentrating or making decisions? N  Walking or climbing stairs? N  Dressing or bathing? N  Doing errands, shopping? Y  Preparing Food and eating ? N  Using the Toilet? N  In the past six months, have you accidently leaked urine? N  Do you have problems with loss of bowel control? N  Managing  your Medications? Y  Managing your Finances? Y  Housekeeping or managing your Housekeeping? N  Some recent data might be hidden    Fall/Depression Screening:  PHQ 2/9 Scores 12/14/2016 12/05/2016 11/29/2016  PHQ - 2 Score 0 0 0    Assessment:  CSW met with patient and his grandson, Glenard Haring, in their home. CSW completed assessment and will be assisting patient with community resource linking for transportation (they walk to the PCP, drug store, etc unless they get a ride from someone), food resources for food pantry, free meals and referral for meals on wheels, Silver Sneakers, etc.  Patient and grandson were assisted by CSW to call [facility] and request OTC catalog to utilize the $250 annual credit he has to order OTC items from [employer]. We also went over [facility] benefits including 23 one way rides he is eligible for.  Discussed Advance Directives and will send packet for his review and consideration.     Plan: CSW will plan f/u call before PCP visit on 01/04/17.   [facility] CM Care Plan Problem One     Most Recent Value  Care Plan Problem One  Patient has no reliable transportation  Role Documenting the Problem One  Clinical Social Worker  Care Plan for Problem One  Active  [facility] Long Term Goal      [facility] Long Term Goal Start Date  12/05/16  Interventions for Problem One Long Term Goal     [facility] CM  Short Term Goal #1   Patient will report review, receipt and consideration of transportation services provided within the next 30 days.  [facility] CM Short Term Goal #1 Start Date  12/14/16  Interventions for Short Term Goal #1  CSW discussed provided and will link patient with transportation resources.  [facility] CM Short Term Goal #2   Patient will report adequate transportation resources secured in the next 30 days.  [facility] CM Short Term Goal #2 Start Date  12/14/16  Interventions for Short Term Goal #2  CSW provided,linked and instructed patient and family on transportation services.   [facility] CM Short Term Goal #3  Patient will be  linked with community resources to consider iin the next 30 days.  [facility] CM Short Term Goal #3 Start Date  12/14/16  Interventions for Short Tern Goal #3  CSW discussed and provided information on community services to consider.        Eduard Clos, MSW, [place] Worker  [place] [phone]

## 2016-12-18 ENCOUNTER — Ambulatory Visit: Payer: Self-pay

## 2016-12-18 ENCOUNTER — Other Ambulatory Visit: Payer: Self-pay

## 2016-12-18 NOTE — Patient Outreach (Signed)
Follow up telephone assessment: Vitals:   12/18/16 0935  Weight: 184 lb (83.5 kg)   Phone call to patient to follow up on referrals and current status. Patient reports that he is weighing daily.  Reports no swelling. States that he went to MD as planned and reports decrease in "fluid pill" .  Reports that he has not yet gotten his lancet devices because he gets paid tomorrow. Reports that he does have the right prescription.   Reports that he has all his medications.  PLAN: will follow up again with patient in 2 weeks.  Rowe Pavy, RN, BSN, CEN [facility] [employer] [phone]

## 2016-12-21 DIAGNOSIS — I130 Hypertensive heart and chronic kidney disease with heart failure and stage 1 through stage 4 chronic kidney disease, or unspecified chronic kidney disease: Secondary | ICD-10-CM | POA: Diagnosis not present

## 2016-12-21 DIAGNOSIS — Z87891 Personal history of nicotine dependence: Secondary | ICD-10-CM | POA: Diagnosis not present

## 2016-12-21 DIAGNOSIS — M109 Gout, unspecified: Secondary | ICD-10-CM | POA: Diagnosis not present

## 2016-12-21 DIAGNOSIS — I5022 Chronic systolic (congestive) heart failure: Secondary | ICD-10-CM | POA: Diagnosis not present

## 2016-12-21 DIAGNOSIS — I252 Old myocardial infarction: Secondary | ICD-10-CM | POA: Diagnosis not present

## 2016-12-21 DIAGNOSIS — Z95810 Presence of automatic (implantable) cardiac defibrillator: Secondary | ICD-10-CM | POA: Diagnosis not present

## 2016-12-21 DIAGNOSIS — N189 Chronic kidney disease, unspecified: Secondary | ICD-10-CM | POA: Diagnosis not present

## 2016-12-21 DIAGNOSIS — Z794 Long term (current) use of insulin: Secondary | ICD-10-CM | POA: Diagnosis not present

## 2016-12-21 DIAGNOSIS — J449 Chronic obstructive pulmonary disease, unspecified: Secondary | ICD-10-CM | POA: Diagnosis not present

## 2016-12-21 DIAGNOSIS — Z7901 Long term (current) use of anticoagulants: Secondary | ICD-10-CM | POA: Diagnosis not present

## 2016-12-21 DIAGNOSIS — M1991 Primary osteoarthritis, unspecified site: Secondary | ICD-10-CM | POA: Diagnosis not present

## 2016-12-21 DIAGNOSIS — Z7952 Long term (current) use of systemic steroids: Secondary | ICD-10-CM | POA: Diagnosis not present

## 2016-12-21 DIAGNOSIS — I480 Paroxysmal atrial fibrillation: Secondary | ICD-10-CM | POA: Diagnosis not present

## 2016-12-21 DIAGNOSIS — J439 Emphysema, unspecified: Secondary | ICD-10-CM | POA: Diagnosis not present

## 2016-12-21 DIAGNOSIS — Z7951 Long term (current) use of inhaled steroids: Secondary | ICD-10-CM | POA: Diagnosis not present

## 2016-12-21 DIAGNOSIS — E1122 Type 2 diabetes mellitus with diabetic chronic kidney disease: Secondary | ICD-10-CM | POA: Diagnosis not present

## 2016-12-22 ENCOUNTER — Other Ambulatory Visit: Payer: Self-pay | Admitting: Pharmacist

## 2016-12-22 NOTE — Patient Outreach (Signed)
[facility]) Care Management  12/22/2016  Crystian Timbers 24-May-1946 [id number]  Successful phone outreach to patient's grandson, [name], on [facility] Consent form.   [name] reports he is setting up patient's pill box and patient is taking his medications.  He reports he has not been able to find patient's insurance card---he was advised he can contact [employer] to request a new card be sent.    [name] reports they received resources from [facility] LCSW as well.  Discussed with [name], that if patient has Extra Help, there are limited patient assistance resources for medications.   Counseled [name] that 90 day supplies may offer patient cost savings if he discusses with patient's PCP.   [name] states they have no other pharmacy related questions/concerns at this time.   Plan:  Will close pharmacy episode as grandson reports no further pharmacy related needs and patient may not be eligible for manufacturer assistance at this time.    Tommye Standard, PharmD, [facility] Clinical Pharmacist [facility] [phone]

## 2017-01-01 ENCOUNTER — Other Ambulatory Visit: Payer: Self-pay

## 2017-01-01 ENCOUNTER — Other Ambulatory Visit: Payer: Self-pay | Admitting: *Deleted

## 2017-01-01 NOTE — Patient Outreach (Signed)
[facility]) Care Management  [facility] Social Work  01/01/2017  Michael [name] 1945-11-13 [id number]  Subjective:  PCP visit this week; has not received community resources information sent in the mail.     Objective: CSW to assist patient with commuity based resources to aide in her well-being, quality of life and overall safety/needs.    Encounter Medications:  Outpatient Encounter Prescriptions as of 01/01/2017  Medication Sig Note  . atorvastatin (LIPITOR) 20 MG tablet Take 20 mg by mouth daily.   . furosemide (LASIX) 40 MG tablet Take 40 mg by mouth daily. 12/14/2016: Per grandson, PCP changed LASIX to take half of 40 mg tab one day and 40 mg the following day alternating this on-going.   . hydrALAZINE (APRESOLINE) 25 MG tablet Take 12.5 mg by mouth 2 (two) times daily.    . insulin degludec (TRESIBA) 100 UNIT/ML SOPN FlexTouch Pen Inject 8 Units into the skin daily at 10 pm.   . lisinopril (PRINIVIL,ZESTRIL) 5 MG tablet Take 5 mg by mouth daily. 12/05/2016: Currently out of this medication  . metoprolol succinate (TOPROL-XL) 50 MG 24 hr tablet Take 50 mg by mouth 2 (two) times daily. Take with or immediately following a meal. 12/05/2016: Currently out of this medication  . Rivaroxaban (XARELTO) 15 MG TABS tablet Take 15 mg by mouth daily.   Marland Kitchen spironolactone (ALDACTONE) 50 MG tablet Take 25 mg by mouth daily.   . traMADol-acetaminophen (ULTRACET) 37.5-325 MG tablet Take 1 tablet by mouth as needed.    No facility-administered encounter medications on file as of 01/01/2017.     Functional Status:  In your present state of health, do you have any difficulty performing the following activities: 12/05/2016  Hearing? Y  Vision? N  Difficulty concentrating or making decisions? N  Walking or climbing stairs? N  Dressing or bathing? N  Doing errands, shopping? Y  Preparing Food and eating ? N  Using the Toilet? N  In the past six months, have you accidently leaked urine? N   Do you have problems with loss of bowel control? N  Managing your Medications? Y  Managing your Finances? Y  Housekeeping or managing your Housekeeping? N  Some recent data might be hidden    Fall/Depression Screening:  PHQ 2/9 Scores 12/14/2016 12/05/2016 11/29/2016  PHQ - 2 Score 0 0 0    Assessment: CSW spoke with patient's grandson, [name], by phone today. He reports they have not received the packet of community resources mailed to them. CSW will request this be resent.  Patient reports he was able to get his DM supplies and denies any new concerns or issues.  CSW will resend the community resources and f/u to confirm he has received this in 7-10 days.    Plan:  [facility] CM Care Plan Problem One     Most Recent Value  Care Plan Problem One  Patient has no reliable transportation  Role Documenting the Problem One  Clinical Social Worker  Care Plan for Problem One  Active  [facility] Long Term Goal      [facility] Long Term Goal Start Date  12/05/16  Interventions for Problem One Long Term Goal     [facility] CM Short Term Goal #1   Patient will report review, receipt and consideration of transportation services provided within the next 30 days.  [facility] CM Short Term Goal #1 Start Date  12/14/16  Interventions for Short Term Goal #1  CSW discussed provided and will link patient with  transportation resources.  [facility] CM Short Term Goal #2   Patient will report adequate transportation resources secured in the next 30 days.  [facility] CM Short Term Goal #2 Start Date  12/14/16  Interventions for Short Term Goal #2  CSW provided,linked and instructed patient and family on transportation services.   [facility] CM Short Term Goal #3  Patient will be linked with community resources to consider iin the next 30 days.  [facility] CM Short Term Goal #3 Start Date  12/14/16  Interventions for Short Tern Goal #3  CSW discussed and provided information on community services to consider.       Reece Levy, MSW, LCSW Clinical Social Worker   [facility] [phone]

## 2017-01-01 NOTE — Patient Outreach (Signed)
Telephone assessment: Placed call to patient to follow up on medications.  Spoke with grandson, Chief Technology Officer.  Reports that Mr. Hammers is doing well. Reports that patient continues to weigh daily and monitor CBG.  Todays weight of 184.  Reports CBG was 'good"  Grandson reports that patient has all his medications and is taking them as prescribed.  PLAN: reviewed with grandson no other needs for nursing. Will close out to nursing at this time. Patient remains active with [facility] Child psychotherapist. Will update social worker and MD.  Rowe Pavy, RN, BSN, CEN [facility] Care Coordinator [phone]

## 2017-01-10 ENCOUNTER — Ambulatory Visit: Payer: Self-pay | Admitting: *Deleted

## 2017-01-11 ENCOUNTER — Ambulatory Visit: Payer: Self-pay | Admitting: *Deleted

## 2017-01-11 ENCOUNTER — Other Ambulatory Visit: Payer: Self-pay | Admitting: *Deleted

## 2017-01-15 ENCOUNTER — Ambulatory Visit: Payer: Self-pay | Admitting: *Deleted

## 2017-01-15 ENCOUNTER — Other Ambulatory Visit: Payer: Self-pay | Admitting: *Deleted

## 2017-01-15 NOTE — Patient Outreach (Signed)
[facility]) Care Management  01/15/2017  Ryerson Stukel 1945-09-18 [id number]   CSW has made a 3rd phone attempt to reach patient and family without success. CSW will send outreach letter to patient and plan case closure in 10 days if no response.    Reece Levy, MSW, LCSW Clinical Social Worker  [facility] [phone]

## 2017-01-24 DIAGNOSIS — M5431 Sciatica, right side: Secondary | ICD-10-CM | POA: Diagnosis not present

## 2017-01-26 ENCOUNTER — Encounter: Payer: Self-pay | Admitting: *Deleted

## 2017-01-26 ENCOUNTER — Other Ambulatory Visit: Payer: Self-pay | Admitting: *Deleted

## 2017-01-26 NOTE — Patient Outreach (Signed)
  [facility]) Care Management  [facility] Social Work  01/26/2017  Michael [name] Jan 09, 1946 [id number]  Subjective:  Unable to reach patient by phone or letter for follow up after in home visit and support.  Objective: CSW to assist patient with commuity based resources to aide in his well-being, quality of life and overall safety/needs.    Current Medications:  Current Outpatient Prescriptions  Medication Sig Dispense Refill  . atorvastatin (LIPITOR) 20 MG tablet Take 20 mg by mouth daily.    . furosemide (LASIX) 40 MG tablet Take 40 mg by mouth daily.    . hydrALAZINE (APRESOLINE) 25 MG tablet Take 12.5 mg by mouth 2 (two) times daily.     . insulin degludec (TRESIBA) 100 UNIT/ML SOPN FlexTouch Pen Inject 8 Units into the skin daily at 10 pm.    . lisinopril (PRINIVIL,ZESTRIL) 5 MG tablet Take 5 mg by mouth daily.    . metoprolol succinate (TOPROL-XL) 50 MG 24 hr tablet Take 50 mg by mouth 2 (two) times daily. Take with or immediately following a meal.    . Rivaroxaban (XARELTO) 15 MG TABS tablet Take 15 mg by mouth daily.    Marland Kitchen spironolactone (ALDACTONE) 50 MG tablet Take 25 mg by mouth daily.    . traMADol-acetaminophen (ULTRACET) 37.5-325 MG tablet Take 1 tablet by mouth as needed.     No current facility-administered medications for this visit.     Functional Status:  In your present state of health, do you have any difficulty performing the following activities: 12/05/2016  Hearing? Y  Vision? N  Difficulty concentrating or making decisions? N  Walking or climbing stairs? N  Dressing or bathing? N  Doing errands, shopping? Y  Comment does not Engineer, manufacturing and eating ? N  Using the Toilet? N  In the past six months, have you accidently leaked urine? N  Do you have problems with loss of bowel control? N  Managing your Medications? Y  Comment meds are too expense.  30 dollar per month.   Managing your Finances? Y  Comment reports difficulty paying MD  bills  Housekeeping or managing your Housekeeping? N  Some recent data might be hidden    Fall/Depression Screening:  Fall Risk  12/14/2016 12/05/2016 11/29/2016  Falls in the past year? Yes Yes No   PHQ 2/9 Scores 12/14/2016 12/05/2016 11/29/2016  PHQ - 2 Score 0 0 0    Plan:  Case Closure due to unsuccessful follow up outreach.  Will advise PCP and [facility] team.   Reece Levy, MSW, LCSW Clinical Social Worker  [facility] [phone]

## 2017-08-09 ENCOUNTER — Other Ambulatory Visit: Payer: Self-pay | Admitting: *Deleted

## 2017-08-09 DIAGNOSIS — I509 Heart failure, unspecified: Secondary | ICD-10-CM | POA: Diagnosis not present

## 2017-08-09 DIAGNOSIS — D539 Nutritional anemia, unspecified: Secondary | ICD-10-CM | POA: Diagnosis not present

## 2017-08-09 DIAGNOSIS — E1122 Type 2 diabetes mellitus with diabetic chronic kidney disease: Secondary | ICD-10-CM | POA: Diagnosis not present

## 2017-08-09 DIAGNOSIS — N289 Disorder of kidney and ureter, unspecified: Secondary | ICD-10-CM | POA: Diagnosis not present

## 2017-08-09 DIAGNOSIS — E785 Hyperlipidemia, unspecified: Secondary | ICD-10-CM | POA: Diagnosis not present

## 2017-08-09 NOTE — Patient Outreach (Signed)
[facility]) Care Management  08/09/2017  Michael [name] May 30, 1946 [id number]   CSW received phone call from patient on 08/09/17. He was atttempting to reach someone about his medications/cost.  CSW was able to confirm his identity and advised patient I would have [facility] RPH follow up to discuss further for hopeful support.  CSW has placed order for [facility] and also have briefed [facility] RPH.   Reece Levy, MSW, LCSW Clinical Social Worker  [facility] [phone]

## 2017-08-09 NOTE — Addendum Note (Signed)
Addended by: Buck Mam on: 08/09/2017 11:34 AM   Modules accepted: Orders

## 2017-08-20 ENCOUNTER — Other Ambulatory Visit: Payer: Self-pay | Admitting: Pharmacist

## 2017-08-20 NOTE — Patient Outreach (Signed)
[facility]) Care Management  08/20/2017  [name] 09-11-1945 [id number]  Patient was referred to [facility] CM Pharmacist by [facility] Floydene Flock for medication cost questions.   Unsuccessful phone outreach to patient, HIPAA compliant message left requesting return call.   Plan:  If no return call, second phone outreach within the next week.   Tommye Standard, PharmD, [facility] Clinical Pharmacist [facility] [phone]

## 2017-08-21 ENCOUNTER — Other Ambulatory Visit: Payer: Self-pay | Admitting: Pharmacist

## 2017-08-21 NOTE — Patient Outreach (Signed)
[facility]) Care Management  08/21/2017  Michael [name] [birth year] [id number]  Second outreach to patient successful.  He answered and verified his name.  [facility] Pharmacist introduced self and stated purpose of call was regarding medication cost questions.  Patient quickly stated he had not issues, thanked [facility] pharmacist for call, and disconnected call before further HIPAA details could be verified.   Plan:  Case closed.   [facility] Case management assistance notified.   Tommye Standard, PharmD, [facility] Clinical Pharmacist [facility] [phone]

## 2017-08-23 DIAGNOSIS — Z7901 Long term (current) use of anticoagulants: Secondary | ICD-10-CM

## 2017-08-23 HISTORY — DX: Long term (current) use of anticoagulants: Z79.01

## 2017-08-29 DIAGNOSIS — N183 Chronic kidney disease, stage 3 (moderate): Secondary | ICD-10-CM | POA: Diagnosis not present

## 2017-08-29 DIAGNOSIS — I472 Ventricular tachycardia: Secondary | ICD-10-CM | POA: Diagnosis not present

## 2017-08-29 DIAGNOSIS — I482 Chronic atrial fibrillation: Secondary | ICD-10-CM | POA: Diagnosis not present

## 2017-08-29 DIAGNOSIS — I420 Dilated cardiomyopathy: Secondary | ICD-10-CM | POA: Diagnosis not present

## 2017-08-29 DIAGNOSIS — Z95810 Presence of automatic (implantable) cardiac defibrillator: Secondary | ICD-10-CM | POA: Diagnosis not present

## 2017-09-03 DIAGNOSIS — M109 Gout, unspecified: Secondary | ICD-10-CM | POA: Diagnosis not present

## 2017-09-03 DIAGNOSIS — M10071 Idiopathic gout, right ankle and foot: Secondary | ICD-10-CM | POA: Diagnosis not present

## 2017-09-10 DIAGNOSIS — Z9181 History of falling: Secondary | ICD-10-CM | POA: Diagnosis not present

## 2017-09-10 DIAGNOSIS — Z23 Encounter for immunization: Secondary | ICD-10-CM | POA: Diagnosis not present

## 2017-09-10 DIAGNOSIS — Z1211 Encounter for screening for malignant neoplasm of colon: Secondary | ICD-10-CM | POA: Diagnosis not present

## 2017-09-10 DIAGNOSIS — Z136 Encounter for screening for cardiovascular disorders: Secondary | ICD-10-CM | POA: Diagnosis not present

## 2017-09-10 DIAGNOSIS — Z0000 Encounter for general adult medical examination without abnormal findings: Secondary | ICD-10-CM | POA: Diagnosis not present

## 2017-09-10 DIAGNOSIS — Z1331 Encounter for screening for depression: Secondary | ICD-10-CM | POA: Diagnosis not present

## 2017-09-10 DIAGNOSIS — E785 Hyperlipidemia, unspecified: Secondary | ICD-10-CM | POA: Diagnosis not present

## 2017-09-10 DIAGNOSIS — Z125 Encounter for screening for malignant neoplasm of prostate: Secondary | ICD-10-CM | POA: Diagnosis not present

## 2017-09-14 DIAGNOSIS — E113293 Type 2 diabetes mellitus with mild nonproliferative diabetic retinopathy without macular edema, bilateral: Secondary | ICD-10-CM | POA: Diagnosis not present

## 2017-09-14 DIAGNOSIS — H26491 Other secondary cataract, right eye: Secondary | ICD-10-CM | POA: Diagnosis not present

## 2017-09-14 DIAGNOSIS — H2512 Age-related nuclear cataract, left eye: Secondary | ICD-10-CM | POA: Diagnosis not present

## 2017-11-19 DIAGNOSIS — Z125 Encounter for screening for malignant neoplasm of prostate: Secondary | ICD-10-CM | POA: Diagnosis not present

## 2017-11-19 DIAGNOSIS — J449 Chronic obstructive pulmonary disease, unspecified: Secondary | ICD-10-CM | POA: Diagnosis not present

## 2017-11-19 DIAGNOSIS — E1122 Type 2 diabetes mellitus with diabetic chronic kidney disease: Secondary | ICD-10-CM | POA: Diagnosis not present

## 2017-11-19 DIAGNOSIS — I10 Essential (primary) hypertension: Secondary | ICD-10-CM | POA: Diagnosis not present

## 2017-11-19 DIAGNOSIS — D539 Nutritional anemia, unspecified: Secondary | ICD-10-CM | POA: Diagnosis not present

## 2017-12-06 DIAGNOSIS — N289 Disorder of kidney and ureter, unspecified: Secondary | ICD-10-CM | POA: Diagnosis not present

## 2018-01-25 DIAGNOSIS — M109 Gout, unspecified: Secondary | ICD-10-CM | POA: Diagnosis not present

## 2018-01-25 DIAGNOSIS — J449 Chronic obstructive pulmonary disease, unspecified: Secondary | ICD-10-CM | POA: Diagnosis not present

## 2018-01-25 DIAGNOSIS — I509 Heart failure, unspecified: Secondary | ICD-10-CM | POA: Diagnosis not present

## 2018-01-25 DIAGNOSIS — N289 Disorder of kidney and ureter, unspecified: Secondary | ICD-10-CM | POA: Diagnosis not present

## 2018-01-25 DIAGNOSIS — I10 Essential (primary) hypertension: Secondary | ICD-10-CM | POA: Diagnosis not present

## 2018-03-01 DIAGNOSIS — E1122 Type 2 diabetes mellitus with diabetic chronic kidney disease: Secondary | ICD-10-CM | POA: Diagnosis not present

## 2018-03-01 DIAGNOSIS — Z23 Encounter for immunization: Secondary | ICD-10-CM | POA: Diagnosis not present

## 2018-03-01 DIAGNOSIS — I509 Heart failure, unspecified: Secondary | ICD-10-CM | POA: Diagnosis not present

## 2018-03-01 DIAGNOSIS — E785 Hyperlipidemia, unspecified: Secondary | ICD-10-CM | POA: Diagnosis not present

## 2018-04-24 DIAGNOSIS — Z7984 Long term (current) use of oral hypoglycemic drugs: Secondary | ICD-10-CM | POA: Diagnosis not present

## 2018-04-24 DIAGNOSIS — I5022 Chronic systolic (congestive) heart failure: Secondary | ICD-10-CM | POA: Diagnosis not present

## 2018-04-24 DIAGNOSIS — R0902 Hypoxemia: Secondary | ICD-10-CM | POA: Diagnosis not present

## 2018-04-24 DIAGNOSIS — R609 Edema, unspecified: Secondary | ICD-10-CM | POA: Diagnosis not present

## 2018-04-24 DIAGNOSIS — I252 Old myocardial infarction: Secondary | ICD-10-CM | POA: Diagnosis not present

## 2018-04-24 DIAGNOSIS — R52 Pain, unspecified: Secondary | ICD-10-CM | POA: Diagnosis not present

## 2018-04-24 DIAGNOSIS — I482 Chronic atrial fibrillation, unspecified: Secondary | ICD-10-CM | POA: Diagnosis not present

## 2018-04-24 DIAGNOSIS — Z7952 Long term (current) use of systemic steroids: Secondary | ICD-10-CM | POA: Diagnosis not present

## 2018-04-24 DIAGNOSIS — Z79899 Other long term (current) drug therapy: Secondary | ICD-10-CM | POA: Diagnosis not present

## 2018-04-24 DIAGNOSIS — G8929 Other chronic pain: Secondary | ICD-10-CM | POA: Diagnosis not present

## 2018-04-24 DIAGNOSIS — J449 Chronic obstructive pulmonary disease, unspecified: Secondary | ICD-10-CM | POA: Diagnosis not present

## 2018-04-24 DIAGNOSIS — M25562 Pain in left knee: Secondary | ICD-10-CM | POA: Diagnosis not present

## 2018-04-24 DIAGNOSIS — E119 Type 2 diabetes mellitus without complications: Secondary | ICD-10-CM | POA: Diagnosis not present

## 2018-04-24 DIAGNOSIS — I110 Hypertensive heart disease with heart failure: Secondary | ICD-10-CM | POA: Diagnosis not present

## 2018-05-13 ENCOUNTER — Other Ambulatory Visit: Payer: Self-pay

## 2018-05-13 NOTE — Patient Outreach (Signed)
[facility]) Care Management  05/13/2018  Michael [name] 1945/08/13 [id number]  TELEPHONE SCREENING Referral date: 04/30/18 Referral source: [employer] referral Referral reason: Medication assistance Insurance: [employer] care Attempt #1  Telephone call to patient regarding referral. Unable to reach patient. HIPAA compliant voice message left with call back phone number.   PLAN: RNCM will attempt 2nd telephone call to patient within 4 business days. RNCM will send outreach letter.   George Ina RN,BSN, CCM [facility] Telephonic  [phone]

## 2018-05-15 ENCOUNTER — Other Ambulatory Visit: Payer: Self-pay

## 2018-05-15 NOTE — Patient Outreach (Addendum)
[facility]) Care Management  05/15/2018  Traelyn Gentleman [birth year] [id number]  TELEPHONE SCREENING Referral date: 04/30/18 Referral source: Utilization management referral  Referral reason: Medication assistance Insurance: [employer] care Attempt 32(error)  #2  Telephone call to patient regarding utilization management referral.  Unable to reach patient. HIPAA compliant voice message left with call back phone number.   PLAN: RNCM will attempt 3rd telephone call to patient within 4 business days.   George Ina RN,BSN, CCM [facility] Telephonic  [phone]

## 2018-05-17 ENCOUNTER — Other Ambulatory Visit: Payer: Self-pay

## 2018-05-17 NOTE — Patient Outreach (Signed)
[facility]) Care Management  05/17/2018  Michael [name] 24-Nov-1945 [id number]   Medication Adherence call to Michael [name] left a message for patient to call back patient is due on Atorvastatin 20 mg. Michael [name] is showing past due under [facility] Ins.   Lillia Abed CPhT Pharmacy Technician [facility] Care Management Direct Dial [phone]  Fax [phone] [email or web address]

## 2018-05-21 ENCOUNTER — Other Ambulatory Visit: Payer: Self-pay

## 2018-05-21 NOTE — Patient Outreach (Signed)
[facility]) Care Management  05/21/2018  Michael [name] [birth year] [id number]  TELEPHONE SCREENING Referral date:04/30/18 Referral source:Utilization management referral  Referral reason:Medication assistance Insurance:[employer] Attempt #3  Telephone call to patient regarding utilization management referral.  Unable to reach patient. HIPAA compliant voice message left with call back phone number.   PLAN: If no return call will proceed with closure  [name], CCM [facility] Telephonic  [phone]

## 2018-05-21 NOTE — Patient Outreach (Signed)
[facility]) Care Management  05/21/2018  Michael [name] 16-Sep-1945 [id number]   Medication Adherence call to Michael [name] left a message for patient to call back patient is due on Atorvastatin 20 mg. Michael [name] is showing past due under [facility] Ins.   Lillia Abed CPhT Pharmacy Technician [facility] Care Management Direct Dial [phone]  Fax [phone] [name]

## 2018-05-22 ENCOUNTER — Ambulatory Visit: Payer: Self-pay

## 2018-05-27 ENCOUNTER — Other Ambulatory Visit: Payer: Self-pay

## 2018-05-27 NOTE — Patient Outreach (Signed)
[facility]) Care Management  05/27/2018  Michon Hirota Jul 30, 1945 [id number]  TELEPHONE SCREENING Referral date:04/30/18 Referral source:Utilization management referral Referral reason:Medication assistance Insurance:un[employer] health care  No response after 3 telephone calls and outreach letter attempt.  PLAN: RNCM will close patient due to being unable to reach.  RNCM will send closure notification to patient's primary MD   George Ina RN,BSN,CCM [facility] Telephonic  [phone]

## 2018-06-07 DIAGNOSIS — M109 Gout, unspecified: Secondary | ICD-10-CM | POA: Diagnosis not present

## 2018-06-07 DIAGNOSIS — Z139 Encounter for screening, unspecified: Secondary | ICD-10-CM | POA: Diagnosis not present

## 2018-06-07 DIAGNOSIS — I509 Heart failure, unspecified: Secondary | ICD-10-CM | POA: Diagnosis not present

## 2018-06-07 DIAGNOSIS — E1122 Type 2 diabetes mellitus with diabetic chronic kidney disease: Secondary | ICD-10-CM | POA: Diagnosis not present

## 2018-06-07 DIAGNOSIS — E785 Hyperlipidemia, unspecified: Secondary | ICD-10-CM | POA: Diagnosis not present

## 2018-07-21 DIAGNOSIS — I110 Hypertensive heart disease with heart failure: Secondary | ICD-10-CM | POA: Diagnosis not present

## 2018-07-21 DIAGNOSIS — Z794 Long term (current) use of insulin: Secondary | ICD-10-CM | POA: Diagnosis not present

## 2018-07-21 DIAGNOSIS — I4891 Unspecified atrial fibrillation: Secondary | ICD-10-CM | POA: Diagnosis not present

## 2018-07-21 DIAGNOSIS — E1122 Type 2 diabetes mellitus with diabetic chronic kidney disease: Secondary | ICD-10-CM | POA: Diagnosis not present

## 2018-07-21 DIAGNOSIS — E785 Hyperlipidemia, unspecified: Secondary | ICD-10-CM | POA: Diagnosis not present

## 2018-07-21 DIAGNOSIS — N289 Disorder of kidney and ureter, unspecified: Secondary | ICD-10-CM | POA: Diagnosis not present

## 2018-07-21 DIAGNOSIS — R0609 Other forms of dyspnea: Secondary | ICD-10-CM | POA: Diagnosis not present

## 2018-07-21 DIAGNOSIS — I5023 Acute on chronic systolic (congestive) heart failure: Secondary | ICD-10-CM | POA: Diagnosis not present

## 2018-07-21 DIAGNOSIS — I252 Old myocardial infarction: Secondary | ICD-10-CM | POA: Diagnosis not present

## 2018-07-21 DIAGNOSIS — Z95810 Presence of automatic (implantable) cardiac defibrillator: Secondary | ICD-10-CM | POA: Diagnosis not present

## 2018-07-21 DIAGNOSIS — I129 Hypertensive chronic kidney disease with stage 1 through stage 4 chronic kidney disease, or unspecified chronic kidney disease: Secondary | ICD-10-CM | POA: Diagnosis not present

## 2018-07-21 DIAGNOSIS — J449 Chronic obstructive pulmonary disease, unspecified: Secondary | ICD-10-CM | POA: Diagnosis not present

## 2018-07-21 DIAGNOSIS — N183 Chronic kidney disease, stage 3 (moderate): Secondary | ICD-10-CM | POA: Diagnosis not present

## 2018-07-21 DIAGNOSIS — I509 Heart failure, unspecified: Secondary | ICD-10-CM | POA: Diagnosis not present

## 2018-07-22 DIAGNOSIS — I509 Heart failure, unspecified: Secondary | ICD-10-CM | POA: Diagnosis not present

## 2018-07-22 DIAGNOSIS — I4891 Unspecified atrial fibrillation: Secondary | ICD-10-CM | POA: Diagnosis not present

## 2018-07-23 DIAGNOSIS — I252 Old myocardial infarction: Secondary | ICD-10-CM | POA: Diagnosis not present

## 2018-07-23 DIAGNOSIS — I482 Chronic atrial fibrillation, unspecified: Secondary | ICD-10-CM | POA: Diagnosis not present

## 2018-07-23 DIAGNOSIS — Z7951 Long term (current) use of inhaled steroids: Secondary | ICD-10-CM | POA: Diagnosis not present

## 2018-07-23 DIAGNOSIS — N183 Chronic kidney disease, stage 3 (moderate): Secondary | ICD-10-CM | POA: Diagnosis not present

## 2018-07-23 DIAGNOSIS — Z87891 Personal history of nicotine dependence: Secondary | ICD-10-CM | POA: Diagnosis not present

## 2018-07-23 DIAGNOSIS — Z791 Long term (current) use of non-steroidal anti-inflammatories (NSAID): Secondary | ICD-10-CM | POA: Diagnosis not present

## 2018-07-23 DIAGNOSIS — Z7901 Long term (current) use of anticoagulants: Secondary | ICD-10-CM | POA: Diagnosis not present

## 2018-07-23 DIAGNOSIS — E785 Hyperlipidemia, unspecified: Secondary | ICD-10-CM | POA: Diagnosis not present

## 2018-07-23 DIAGNOSIS — E1122 Type 2 diabetes mellitus with diabetic chronic kidney disease: Secondary | ICD-10-CM | POA: Diagnosis not present

## 2018-07-23 DIAGNOSIS — I5023 Acute on chronic systolic (congestive) heart failure: Secondary | ICD-10-CM | POA: Diagnosis not present

## 2018-07-23 DIAGNOSIS — Z9119 Patient's noncompliance with other medical treatment and regimen: Secondary | ICD-10-CM | POA: Diagnosis not present

## 2018-07-23 DIAGNOSIS — Z95810 Presence of automatic (implantable) cardiac defibrillator: Secondary | ICD-10-CM | POA: Diagnosis not present

## 2018-07-23 DIAGNOSIS — Z794 Long term (current) use of insulin: Secondary | ICD-10-CM | POA: Diagnosis not present

## 2018-07-23 DIAGNOSIS — M109 Gout, unspecified: Secondary | ICD-10-CM | POA: Diagnosis not present

## 2018-07-23 DIAGNOSIS — M1991 Primary osteoarthritis, unspecified site: Secondary | ICD-10-CM | POA: Diagnosis not present

## 2018-07-23 DIAGNOSIS — J439 Emphysema, unspecified: Secondary | ICD-10-CM | POA: Diagnosis not present

## 2018-07-23 DIAGNOSIS — I130 Hypertensive heart and chronic kidney disease with heart failure and stage 1 through stage 4 chronic kidney disease, or unspecified chronic kidney disease: Secondary | ICD-10-CM | POA: Diagnosis not present

## 2018-07-26 DIAGNOSIS — Z87891 Personal history of nicotine dependence: Secondary | ICD-10-CM | POA: Diagnosis not present

## 2018-07-26 DIAGNOSIS — J439 Emphysema, unspecified: Secondary | ICD-10-CM | POA: Diagnosis not present

## 2018-07-26 DIAGNOSIS — I252 Old myocardial infarction: Secondary | ICD-10-CM | POA: Diagnosis not present

## 2018-07-26 DIAGNOSIS — Z7951 Long term (current) use of inhaled steroids: Secondary | ICD-10-CM | POA: Diagnosis not present

## 2018-07-26 DIAGNOSIS — I130 Hypertensive heart and chronic kidney disease with heart failure and stage 1 through stage 4 chronic kidney disease, or unspecified chronic kidney disease: Secondary | ICD-10-CM | POA: Diagnosis not present

## 2018-07-26 DIAGNOSIS — M109 Gout, unspecified: Secondary | ICD-10-CM | POA: Diagnosis not present

## 2018-07-26 DIAGNOSIS — I482 Chronic atrial fibrillation, unspecified: Secondary | ICD-10-CM | POA: Diagnosis not present

## 2018-07-26 DIAGNOSIS — I5023 Acute on chronic systolic (congestive) heart failure: Secondary | ICD-10-CM | POA: Diagnosis not present

## 2018-07-26 DIAGNOSIS — M1991 Primary osteoarthritis, unspecified site: Secondary | ICD-10-CM | POA: Diagnosis not present

## 2018-07-26 DIAGNOSIS — E785 Hyperlipidemia, unspecified: Secondary | ICD-10-CM | POA: Diagnosis not present

## 2018-07-26 DIAGNOSIS — N183 Chronic kidney disease, stage 3 (moderate): Secondary | ICD-10-CM | POA: Diagnosis not present

## 2018-07-26 DIAGNOSIS — Z7901 Long term (current) use of anticoagulants: Secondary | ICD-10-CM | POA: Diagnosis not present

## 2018-07-26 DIAGNOSIS — E1122 Type 2 diabetes mellitus with diabetic chronic kidney disease: Secondary | ICD-10-CM | POA: Diagnosis not present

## 2018-07-26 DIAGNOSIS — Z95810 Presence of automatic (implantable) cardiac defibrillator: Secondary | ICD-10-CM | POA: Diagnosis not present

## 2018-07-26 DIAGNOSIS — Z791 Long term (current) use of non-steroidal anti-inflammatories (NSAID): Secondary | ICD-10-CM | POA: Diagnosis not present

## 2018-07-26 DIAGNOSIS — Z794 Long term (current) use of insulin: Secondary | ICD-10-CM | POA: Diagnosis not present

## 2018-07-26 DIAGNOSIS — Z9119 Patient's noncompliance with other medical treatment and regimen: Secondary | ICD-10-CM | POA: Diagnosis not present

## 2018-07-29 DIAGNOSIS — I509 Heart failure, unspecified: Secondary | ICD-10-CM | POA: Diagnosis not present

## 2018-07-29 DIAGNOSIS — I482 Chronic atrial fibrillation, unspecified: Secondary | ICD-10-CM | POA: Diagnosis not present

## 2018-07-29 DIAGNOSIS — D539 Nutritional anemia, unspecified: Secondary | ICD-10-CM | POA: Diagnosis not present

## 2018-07-29 DIAGNOSIS — Z79899 Other long term (current) drug therapy: Secondary | ICD-10-CM | POA: Diagnosis not present

## 2018-07-30 DIAGNOSIS — Z95810 Presence of automatic (implantable) cardiac defibrillator: Secondary | ICD-10-CM | POA: Diagnosis not present

## 2018-07-30 DIAGNOSIS — I130 Hypertensive heart and chronic kidney disease with heart failure and stage 1 through stage 4 chronic kidney disease, or unspecified chronic kidney disease: Secondary | ICD-10-CM | POA: Diagnosis not present

## 2018-07-30 DIAGNOSIS — E1122 Type 2 diabetes mellitus with diabetic chronic kidney disease: Secondary | ICD-10-CM | POA: Diagnosis not present

## 2018-07-30 DIAGNOSIS — Z9119 Patient's noncompliance with other medical treatment and regimen: Secondary | ICD-10-CM | POA: Diagnosis not present

## 2018-07-30 DIAGNOSIS — I5023 Acute on chronic systolic (congestive) heart failure: Secondary | ICD-10-CM | POA: Diagnosis not present

## 2018-07-30 DIAGNOSIS — Z794 Long term (current) use of insulin: Secondary | ICD-10-CM | POA: Diagnosis not present

## 2018-07-30 DIAGNOSIS — I482 Chronic atrial fibrillation, unspecified: Secondary | ICD-10-CM | POA: Diagnosis not present

## 2018-07-30 DIAGNOSIS — N183 Chronic kidney disease, stage 3 (moderate): Secondary | ICD-10-CM | POA: Diagnosis not present

## 2018-07-30 DIAGNOSIS — Z791 Long term (current) use of non-steroidal anti-inflammatories (NSAID): Secondary | ICD-10-CM | POA: Diagnosis not present

## 2018-07-30 DIAGNOSIS — I252 Old myocardial infarction: Secondary | ICD-10-CM | POA: Diagnosis not present

## 2018-07-30 DIAGNOSIS — E785 Hyperlipidemia, unspecified: Secondary | ICD-10-CM | POA: Diagnosis not present

## 2018-07-30 DIAGNOSIS — Z7901 Long term (current) use of anticoagulants: Secondary | ICD-10-CM | POA: Diagnosis not present

## 2018-07-30 DIAGNOSIS — M1991 Primary osteoarthritis, unspecified site: Secondary | ICD-10-CM | POA: Diagnosis not present

## 2018-07-30 DIAGNOSIS — Z87891 Personal history of nicotine dependence: Secondary | ICD-10-CM | POA: Diagnosis not present

## 2018-07-30 DIAGNOSIS — Z7951 Long term (current) use of inhaled steroids: Secondary | ICD-10-CM | POA: Diagnosis not present

## 2018-07-30 DIAGNOSIS — J439 Emphysema, unspecified: Secondary | ICD-10-CM | POA: Diagnosis not present

## 2018-07-30 DIAGNOSIS — M109 Gout, unspecified: Secondary | ICD-10-CM | POA: Diagnosis not present

## 2018-07-31 DIAGNOSIS — I482 Chronic atrial fibrillation, unspecified: Secondary | ICD-10-CM | POA: Diagnosis not present

## 2018-07-31 DIAGNOSIS — I130 Hypertensive heart and chronic kidney disease with heart failure and stage 1 through stage 4 chronic kidney disease, or unspecified chronic kidney disease: Secondary | ICD-10-CM | POA: Diagnosis not present

## 2018-07-31 DIAGNOSIS — Z791 Long term (current) use of non-steroidal anti-inflammatories (NSAID): Secondary | ICD-10-CM | POA: Diagnosis not present

## 2018-07-31 DIAGNOSIS — Z7901 Long term (current) use of anticoagulants: Secondary | ICD-10-CM | POA: Diagnosis not present

## 2018-07-31 DIAGNOSIS — I252 Old myocardial infarction: Secondary | ICD-10-CM | POA: Diagnosis not present

## 2018-07-31 DIAGNOSIS — M1991 Primary osteoarthritis, unspecified site: Secondary | ICD-10-CM | POA: Diagnosis not present

## 2018-07-31 DIAGNOSIS — M109 Gout, unspecified: Secondary | ICD-10-CM | POA: Diagnosis not present

## 2018-07-31 DIAGNOSIS — Z794 Long term (current) use of insulin: Secondary | ICD-10-CM | POA: Diagnosis not present

## 2018-07-31 DIAGNOSIS — Z7951 Long term (current) use of inhaled steroids: Secondary | ICD-10-CM | POA: Diagnosis not present

## 2018-07-31 DIAGNOSIS — J439 Emphysema, unspecified: Secondary | ICD-10-CM | POA: Diagnosis not present

## 2018-07-31 DIAGNOSIS — Z87891 Personal history of nicotine dependence: Secondary | ICD-10-CM | POA: Diagnosis not present

## 2018-07-31 DIAGNOSIS — E785 Hyperlipidemia, unspecified: Secondary | ICD-10-CM | POA: Diagnosis not present

## 2018-07-31 DIAGNOSIS — Z95810 Presence of automatic (implantable) cardiac defibrillator: Secondary | ICD-10-CM | POA: Diagnosis not present

## 2018-07-31 DIAGNOSIS — N183 Chronic kidney disease, stage 3 (moderate): Secondary | ICD-10-CM | POA: Diagnosis not present

## 2018-07-31 DIAGNOSIS — Z9119 Patient's noncompliance with other medical treatment and regimen: Secondary | ICD-10-CM | POA: Diagnosis not present

## 2018-07-31 DIAGNOSIS — I5023 Acute on chronic systolic (congestive) heart failure: Secondary | ICD-10-CM | POA: Diagnosis not present

## 2018-07-31 DIAGNOSIS — E1122 Type 2 diabetes mellitus with diabetic chronic kidney disease: Secondary | ICD-10-CM | POA: Diagnosis not present

## 2018-08-06 DIAGNOSIS — J439 Emphysema, unspecified: Secondary | ICD-10-CM | POA: Diagnosis not present

## 2018-08-06 DIAGNOSIS — Z9119 Patient's noncompliance with other medical treatment and regimen: Secondary | ICD-10-CM | POA: Diagnosis not present

## 2018-08-06 DIAGNOSIS — N183 Chronic kidney disease, stage 3 (moderate): Secondary | ICD-10-CM | POA: Diagnosis not present

## 2018-08-06 DIAGNOSIS — Z87891 Personal history of nicotine dependence: Secondary | ICD-10-CM | POA: Diagnosis not present

## 2018-08-06 DIAGNOSIS — M109 Gout, unspecified: Secondary | ICD-10-CM | POA: Diagnosis not present

## 2018-08-06 DIAGNOSIS — Z95810 Presence of automatic (implantable) cardiac defibrillator: Secondary | ICD-10-CM | POA: Diagnosis not present

## 2018-08-06 DIAGNOSIS — E785 Hyperlipidemia, unspecified: Secondary | ICD-10-CM | POA: Diagnosis not present

## 2018-08-06 DIAGNOSIS — E1122 Type 2 diabetes mellitus with diabetic chronic kidney disease: Secondary | ICD-10-CM | POA: Diagnosis not present

## 2018-08-06 DIAGNOSIS — M1991 Primary osteoarthritis, unspecified site: Secondary | ICD-10-CM | POA: Diagnosis not present

## 2018-08-06 DIAGNOSIS — Z791 Long term (current) use of non-steroidal anti-inflammatories (NSAID): Secondary | ICD-10-CM | POA: Diagnosis not present

## 2018-08-06 DIAGNOSIS — Z7951 Long term (current) use of inhaled steroids: Secondary | ICD-10-CM | POA: Diagnosis not present

## 2018-08-06 DIAGNOSIS — I5023 Acute on chronic systolic (congestive) heart failure: Secondary | ICD-10-CM | POA: Diagnosis not present

## 2018-08-06 DIAGNOSIS — I482 Chronic atrial fibrillation, unspecified: Secondary | ICD-10-CM | POA: Diagnosis not present

## 2018-08-06 DIAGNOSIS — Z7901 Long term (current) use of anticoagulants: Secondary | ICD-10-CM | POA: Diagnosis not present

## 2018-08-06 DIAGNOSIS — Z794 Long term (current) use of insulin: Secondary | ICD-10-CM | POA: Diagnosis not present

## 2018-08-06 DIAGNOSIS — I130 Hypertensive heart and chronic kidney disease with heart failure and stage 1 through stage 4 chronic kidney disease, or unspecified chronic kidney disease: Secondary | ICD-10-CM | POA: Diagnosis not present

## 2018-08-06 DIAGNOSIS — I252 Old myocardial infarction: Secondary | ICD-10-CM | POA: Diagnosis not present

## 2018-08-07 DIAGNOSIS — I5023 Acute on chronic systolic (congestive) heart failure: Secondary | ICD-10-CM | POA: Diagnosis not present

## 2018-08-07 DIAGNOSIS — Z7901 Long term (current) use of anticoagulants: Secondary | ICD-10-CM | POA: Diagnosis not present

## 2018-08-07 DIAGNOSIS — I252 Old myocardial infarction: Secondary | ICD-10-CM | POA: Diagnosis not present

## 2018-08-07 DIAGNOSIS — I509 Heart failure, unspecified: Secondary | ICD-10-CM | POA: Diagnosis not present

## 2018-08-07 DIAGNOSIS — J9811 Atelectasis: Secondary | ICD-10-CM | POA: Diagnosis not present

## 2018-08-07 DIAGNOSIS — N183 Chronic kidney disease, stage 3 (moderate): Secondary | ICD-10-CM | POA: Diagnosis not present

## 2018-08-07 DIAGNOSIS — B353 Tinea pedis: Secondary | ICD-10-CM | POA: Diagnosis not present

## 2018-08-07 DIAGNOSIS — I420 Dilated cardiomyopathy: Secondary | ICD-10-CM | POA: Diagnosis not present

## 2018-08-07 DIAGNOSIS — Z8249 Family history of ischemic heart disease and other diseases of the circulatory system: Secondary | ICD-10-CM | POA: Diagnosis not present

## 2018-08-07 DIAGNOSIS — I4891 Unspecified atrial fibrillation: Secondary | ICD-10-CM | POA: Diagnosis not present

## 2018-08-07 DIAGNOSIS — I452 Bifascicular block: Secondary | ICD-10-CM | POA: Diagnosis not present

## 2018-08-07 DIAGNOSIS — I499 Cardiac arrhythmia, unspecified: Secondary | ICD-10-CM | POA: Diagnosis not present

## 2018-08-07 DIAGNOSIS — R000 Tachycardia, unspecified: Secondary | ICD-10-CM | POA: Diagnosis not present

## 2018-08-07 DIAGNOSIS — N179 Acute kidney failure, unspecified: Secondary | ICD-10-CM | POA: Diagnosis not present

## 2018-08-07 DIAGNOSIS — T82897A Other specified complication of cardiac prosthetic devices, implants and grafts, initial encounter: Secondary | ICD-10-CM | POA: Diagnosis not present

## 2018-08-07 DIAGNOSIS — J449 Chronic obstructive pulmonary disease, unspecified: Secondary | ICD-10-CM | POA: Diagnosis not present

## 2018-08-07 DIAGNOSIS — Z7951 Long term (current) use of inhaled steroids: Secondary | ICD-10-CM | POA: Diagnosis not present

## 2018-08-07 DIAGNOSIS — I255 Ischemic cardiomyopathy: Secondary | ICD-10-CM | POA: Diagnosis not present

## 2018-08-07 DIAGNOSIS — E1122 Type 2 diabetes mellitus with diabetic chronic kidney disease: Secondary | ICD-10-CM | POA: Diagnosis not present

## 2018-08-07 DIAGNOSIS — Z87891 Personal history of nicotine dependence: Secondary | ICD-10-CM | POA: Diagnosis not present

## 2018-08-07 DIAGNOSIS — Z794 Long term (current) use of insulin: Secondary | ICD-10-CM | POA: Diagnosis not present

## 2018-08-07 DIAGNOSIS — I482 Chronic atrial fibrillation, unspecified: Secondary | ICD-10-CM | POA: Diagnosis not present

## 2018-08-07 DIAGNOSIS — R9431 Abnormal electrocardiogram [ECG] [EKG]: Secondary | ICD-10-CM | POA: Diagnosis not present

## 2018-08-07 DIAGNOSIS — I248 Other forms of acute ischemic heart disease: Secondary | ICD-10-CM | POA: Diagnosis not present

## 2018-08-07 DIAGNOSIS — E876 Hypokalemia: Secondary | ICD-10-CM | POA: Diagnosis not present

## 2018-08-07 DIAGNOSIS — I454 Nonspecific intraventricular block: Secondary | ICD-10-CM | POA: Diagnosis not present

## 2018-08-07 DIAGNOSIS — M1000 Idiopathic gout, unspecified site: Secondary | ICD-10-CM | POA: Diagnosis not present

## 2018-08-07 DIAGNOSIS — I447 Left bundle-branch block, unspecified: Secondary | ICD-10-CM | POA: Diagnosis not present

## 2018-08-07 DIAGNOSIS — M1991 Primary osteoarthritis, unspecified site: Secondary | ICD-10-CM | POA: Diagnosis not present

## 2018-08-07 DIAGNOSIS — I4819 Other persistent atrial fibrillation: Secondary | ICD-10-CM | POA: Diagnosis not present

## 2018-08-07 DIAGNOSIS — G8929 Other chronic pain: Secondary | ICD-10-CM | POA: Diagnosis not present

## 2018-08-07 DIAGNOSIS — M6281 Muscle weakness (generalized): Secondary | ICD-10-CM | POA: Diagnosis not present

## 2018-08-07 DIAGNOSIS — E872 Acidosis: Secondary | ICD-10-CM | POA: Diagnosis not present

## 2018-08-07 DIAGNOSIS — I2510 Atherosclerotic heart disease of native coronary artery without angina pectoris: Secondary | ICD-10-CM | POA: Diagnosis not present

## 2018-08-07 DIAGNOSIS — Z791 Long term (current) use of non-steroidal anti-inflammatories (NSAID): Secondary | ICD-10-CM | POA: Diagnosis not present

## 2018-08-07 DIAGNOSIS — I429 Cardiomyopathy, unspecified: Secondary | ICD-10-CM | POA: Diagnosis not present

## 2018-08-07 DIAGNOSIS — Z9119 Patient's noncompliance with other medical treatment and regimen: Secondary | ICD-10-CM | POA: Diagnosis not present

## 2018-08-07 DIAGNOSIS — I130 Hypertensive heart and chronic kidney disease with heart failure and stage 1 through stage 4 chronic kidney disease, or unspecified chronic kidney disease: Secondary | ICD-10-CM | POA: Diagnosis not present

## 2018-08-07 DIAGNOSIS — E785 Hyperlipidemia, unspecified: Secondary | ICD-10-CM | POA: Diagnosis not present

## 2018-08-07 DIAGNOSIS — M1990 Unspecified osteoarthritis, unspecified site: Secondary | ICD-10-CM | POA: Diagnosis not present

## 2018-08-07 DIAGNOSIS — J439 Emphysema, unspecified: Secondary | ICD-10-CM | POA: Diagnosis not present

## 2018-08-07 DIAGNOSIS — I5020 Unspecified systolic (congestive) heart failure: Secondary | ICD-10-CM | POA: Diagnosis not present

## 2018-08-07 DIAGNOSIS — Z95810 Presence of automatic (implantable) cardiac defibrillator: Secondary | ICD-10-CM | POA: Diagnosis not present

## 2018-08-07 DIAGNOSIS — R488 Other symbolic dysfunctions: Secondary | ICD-10-CM | POA: Diagnosis not present

## 2018-08-07 DIAGNOSIS — R2689 Other abnormalities of gait and mobility: Secondary | ICD-10-CM | POA: Diagnosis not present

## 2018-08-07 DIAGNOSIS — I2584 Coronary atherosclerosis due to calcified coronary lesion: Secondary | ICD-10-CM | POA: Diagnosis not present

## 2018-08-07 DIAGNOSIS — M109 Gout, unspecified: Secondary | ICD-10-CM | POA: Diagnosis not present

## 2018-08-07 DIAGNOSIS — R7989 Other specified abnormal findings of blood chemistry: Secondary | ICD-10-CM | POA: Diagnosis not present

## 2018-08-07 DIAGNOSIS — E875 Hyperkalemia: Secondary | ICD-10-CM | POA: Diagnosis not present

## 2018-08-07 DIAGNOSIS — M25551 Pain in right hip: Secondary | ICD-10-CM | POA: Diagnosis not present

## 2018-08-07 DIAGNOSIS — I472 Ventricular tachycardia: Secondary | ICD-10-CM | POA: Diagnosis not present

## 2018-08-07 DIAGNOSIS — I501 Left ventricular failure: Secondary | ICD-10-CM | POA: Diagnosis not present

## 2018-08-21 DIAGNOSIS — E039 Hypothyroidism, unspecified: Secondary | ICD-10-CM | POA: Diagnosis not present

## 2018-08-21 DIAGNOSIS — M6281 Muscle weakness (generalized): Secondary | ICD-10-CM | POA: Diagnosis not present

## 2018-08-21 DIAGNOSIS — I482 Chronic atrial fibrillation, unspecified: Secondary | ICD-10-CM | POA: Diagnosis not present

## 2018-08-21 DIAGNOSIS — R269 Unspecified abnormalities of gait and mobility: Secondary | ICD-10-CM | POA: Diagnosis not present

## 2018-08-21 DIAGNOSIS — E1121 Type 2 diabetes mellitus with diabetic nephropathy: Secondary | ICD-10-CM | POA: Diagnosis not present

## 2018-08-21 DIAGNOSIS — E119 Type 2 diabetes mellitus without complications: Secondary | ICD-10-CM | POA: Diagnosis not present

## 2018-08-21 DIAGNOSIS — N183 Chronic kidney disease, stage 3 (moderate): Secondary | ICD-10-CM | POA: Diagnosis not present

## 2018-08-21 DIAGNOSIS — Z79899 Other long term (current) drug therapy: Secondary | ICD-10-CM | POA: Diagnosis not present

## 2018-08-21 DIAGNOSIS — N179 Acute kidney failure, unspecified: Secondary | ICD-10-CM | POA: Diagnosis not present

## 2018-08-21 DIAGNOSIS — D649 Anemia, unspecified: Secondary | ICD-10-CM | POA: Diagnosis not present

## 2018-08-21 DIAGNOSIS — M1000 Idiopathic gout, unspecified site: Secondary | ICD-10-CM | POA: Diagnosis not present

## 2018-08-21 DIAGNOSIS — I517 Cardiomegaly: Secondary | ICD-10-CM | POA: Diagnosis not present

## 2018-08-21 DIAGNOSIS — E785 Hyperlipidemia, unspecified: Secondary | ICD-10-CM | POA: Diagnosis not present

## 2018-08-21 DIAGNOSIS — I509 Heart failure, unspecified: Secondary | ICD-10-CM | POA: Diagnosis not present

## 2018-08-21 DIAGNOSIS — I472 Ventricular tachycardia: Secondary | ICD-10-CM | POA: Diagnosis not present

## 2018-08-21 DIAGNOSIS — R488 Other symbolic dysfunctions: Secondary | ICD-10-CM | POA: Diagnosis not present

## 2018-08-21 DIAGNOSIS — Z95810 Presence of automatic (implantable) cardiac defibrillator: Secondary | ICD-10-CM | POA: Diagnosis not present

## 2018-08-21 DIAGNOSIS — R05 Cough: Secondary | ICD-10-CM | POA: Diagnosis not present

## 2018-08-21 DIAGNOSIS — J449 Chronic obstructive pulmonary disease, unspecified: Secondary | ICD-10-CM | POA: Diagnosis not present

## 2018-08-21 DIAGNOSIS — R2689 Other abnormalities of gait and mobility: Secondary | ICD-10-CM | POA: Diagnosis not present

## 2018-08-21 DIAGNOSIS — L89322 Pressure ulcer of left buttock, stage 2: Secondary | ICD-10-CM | POA: Diagnosis not present

## 2018-08-21 DIAGNOSIS — I5023 Acute on chronic systolic (congestive) heart failure: Secondary | ICD-10-CM | POA: Diagnosis not present

## 2018-08-21 DIAGNOSIS — I10 Essential (primary) hypertension: Secondary | ICD-10-CM | POA: Diagnosis not present

## 2018-08-21 DIAGNOSIS — Z7901 Long term (current) use of anticoagulants: Secondary | ICD-10-CM | POA: Diagnosis not present

## 2018-08-21 DIAGNOSIS — R5381 Other malaise: Secondary | ICD-10-CM | POA: Diagnosis not present

## 2018-08-21 DIAGNOSIS — E875 Hyperkalemia: Secondary | ICD-10-CM | POA: Diagnosis not present

## 2018-08-21 DIAGNOSIS — I4891 Unspecified atrial fibrillation: Secondary | ICD-10-CM | POA: Diagnosis not present

## 2018-08-21 DIAGNOSIS — Z8679 Personal history of other diseases of the circulatory system: Secondary | ICD-10-CM | POA: Diagnosis not present

## 2018-08-22 DIAGNOSIS — Z8679 Personal history of other diseases of the circulatory system: Secondary | ICD-10-CM | POA: Diagnosis not present

## 2018-08-22 DIAGNOSIS — Z7901 Long term (current) use of anticoagulants: Secondary | ICD-10-CM | POA: Diagnosis not present

## 2018-08-22 DIAGNOSIS — I4891 Unspecified atrial fibrillation: Secondary | ICD-10-CM | POA: Diagnosis not present

## 2018-08-22 DIAGNOSIS — R5381 Other malaise: Secondary | ICD-10-CM | POA: Diagnosis not present

## 2018-08-27 DIAGNOSIS — Z7901 Long term (current) use of anticoagulants: Secondary | ICD-10-CM | POA: Diagnosis not present

## 2018-08-27 DIAGNOSIS — Z8679 Personal history of other diseases of the circulatory system: Secondary | ICD-10-CM | POA: Diagnosis not present

## 2018-08-27 DIAGNOSIS — I10 Essential (primary) hypertension: Secondary | ICD-10-CM | POA: Diagnosis not present

## 2018-08-27 DIAGNOSIS — R5381 Other malaise: Secondary | ICD-10-CM | POA: Diagnosis not present

## 2018-08-29 DIAGNOSIS — N183 Chronic kidney disease, stage 3 (moderate): Secondary | ICD-10-CM | POA: Diagnosis not present

## 2018-08-29 DIAGNOSIS — D649 Anemia, unspecified: Secondary | ICD-10-CM | POA: Diagnosis not present

## 2018-08-29 DIAGNOSIS — I4891 Unspecified atrial fibrillation: Secondary | ICD-10-CM | POA: Diagnosis not present

## 2018-08-29 DIAGNOSIS — I509 Heart failure, unspecified: Secondary | ICD-10-CM | POA: Diagnosis not present

## 2018-09-02 DIAGNOSIS — J449 Chronic obstructive pulmonary disease, unspecified: Secondary | ICD-10-CM | POA: Diagnosis not present

## 2018-09-02 DIAGNOSIS — L89322 Pressure ulcer of left buttock, stage 2: Secondary | ICD-10-CM | POA: Diagnosis not present

## 2018-09-02 DIAGNOSIS — N183 Chronic kidney disease, stage 3 (moderate): Secondary | ICD-10-CM | POA: Diagnosis not present

## 2018-09-02 DIAGNOSIS — Z8679 Personal history of other diseases of the circulatory system: Secondary | ICD-10-CM | POA: Diagnosis not present

## 2018-09-02 DIAGNOSIS — I509 Heart failure, unspecified: Secondary | ICD-10-CM | POA: Diagnosis not present

## 2018-09-03 DIAGNOSIS — I4891 Unspecified atrial fibrillation: Secondary | ICD-10-CM | POA: Diagnosis not present

## 2018-09-03 DIAGNOSIS — R5381 Other malaise: Secondary | ICD-10-CM | POA: Diagnosis not present

## 2018-09-03 DIAGNOSIS — Z7901 Long term (current) use of anticoagulants: Secondary | ICD-10-CM | POA: Diagnosis not present

## 2018-09-03 DIAGNOSIS — Z8679 Personal history of other diseases of the circulatory system: Secondary | ICD-10-CM | POA: Diagnosis not present

## 2018-09-05 DIAGNOSIS — R05 Cough: Secondary | ICD-10-CM | POA: Diagnosis not present

## 2018-09-05 DIAGNOSIS — J449 Chronic obstructive pulmonary disease, unspecified: Secondary | ICD-10-CM | POA: Diagnosis not present

## 2018-09-05 DIAGNOSIS — I509 Heart failure, unspecified: Secondary | ICD-10-CM | POA: Diagnosis not present

## 2018-09-09 DIAGNOSIS — E119 Type 2 diabetes mellitus without complications: Secondary | ICD-10-CM | POA: Diagnosis not present

## 2018-09-09 DIAGNOSIS — R05 Cough: Secondary | ICD-10-CM | POA: Diagnosis not present

## 2018-09-09 DIAGNOSIS — J449 Chronic obstructive pulmonary disease, unspecified: Secondary | ICD-10-CM | POA: Diagnosis not present

## 2018-09-09 DIAGNOSIS — I509 Heart failure, unspecified: Secondary | ICD-10-CM | POA: Diagnosis not present

## 2018-09-09 DIAGNOSIS — L89322 Pressure ulcer of left buttock, stage 2: Secondary | ICD-10-CM | POA: Diagnosis not present

## 2018-09-10 DIAGNOSIS — I4891 Unspecified atrial fibrillation: Secondary | ICD-10-CM | POA: Diagnosis not present

## 2018-09-10 DIAGNOSIS — Z8679 Personal history of other diseases of the circulatory system: Secondary | ICD-10-CM | POA: Diagnosis not present

## 2018-09-10 DIAGNOSIS — R269 Unspecified abnormalities of gait and mobility: Secondary | ICD-10-CM | POA: Diagnosis not present

## 2018-09-10 DIAGNOSIS — Z7901 Long term (current) use of anticoagulants: Secondary | ICD-10-CM | POA: Diagnosis not present

## 2018-12-05 DIAGNOSIS — E1122 Type 2 diabetes mellitus with diabetic chronic kidney disease: Secondary | ICD-10-CM | POA: Diagnosis not present

## 2018-12-05 DIAGNOSIS — E785 Hyperlipidemia, unspecified: Secondary | ICD-10-CM | POA: Diagnosis not present

## 2018-12-05 DIAGNOSIS — M109 Gout, unspecified: Secondary | ICD-10-CM | POA: Diagnosis not present

## 2018-12-05 DIAGNOSIS — I509 Heart failure, unspecified: Secondary | ICD-10-CM | POA: Diagnosis not present

## 2018-12-05 DIAGNOSIS — Z9181 History of falling: Secondary | ICD-10-CM | POA: Diagnosis not present

## 2018-12-05 DIAGNOSIS — I10 Essential (primary) hypertension: Secondary | ICD-10-CM | POA: Diagnosis not present

## 2019-03-09 DIAGNOSIS — J449 Chronic obstructive pulmonary disease, unspecified: Secondary | ICD-10-CM | POA: Diagnosis not present

## 2019-03-09 DIAGNOSIS — I361 Nonrheumatic tricuspid (valve) insufficiency: Secondary | ICD-10-CM | POA: Diagnosis not present

## 2019-03-09 DIAGNOSIS — R42 Dizziness and giddiness: Secondary | ICD-10-CM | POA: Diagnosis not present

## 2019-03-09 DIAGNOSIS — E860 Dehydration: Secondary | ICD-10-CM | POA: Diagnosis not present

## 2019-03-09 DIAGNOSIS — Z794 Long term (current) use of insulin: Secondary | ICD-10-CM | POA: Diagnosis not present

## 2019-03-09 DIAGNOSIS — N183 Chronic kidney disease, stage 3 (moderate): Secondary | ICD-10-CM | POA: Diagnosis not present

## 2019-03-09 DIAGNOSIS — I4891 Unspecified atrial fibrillation: Secondary | ICD-10-CM | POA: Diagnosis not present

## 2019-03-09 DIAGNOSIS — Z95810 Presence of automatic (implantable) cardiac defibrillator: Secondary | ICD-10-CM | POA: Diagnosis not present

## 2019-03-09 DIAGNOSIS — I5023 Acute on chronic systolic (congestive) heart failure: Secondary | ICD-10-CM | POA: Diagnosis not present

## 2019-03-09 DIAGNOSIS — I491 Atrial premature depolarization: Secondary | ICD-10-CM | POA: Diagnosis not present

## 2019-03-09 DIAGNOSIS — E611 Iron deficiency: Secondary | ICD-10-CM | POA: Diagnosis not present

## 2019-03-09 DIAGNOSIS — E1122 Type 2 diabetes mellitus with diabetic chronic kidney disease: Secondary | ICD-10-CM | POA: Diagnosis not present

## 2019-03-09 DIAGNOSIS — E785 Hyperlipidemia, unspecified: Secondary | ICD-10-CM | POA: Diagnosis not present

## 2019-03-09 DIAGNOSIS — I130 Hypertensive heart and chronic kidney disease with heart failure and stage 1 through stage 4 chronic kidney disease, or unspecified chronic kidney disease: Secondary | ICD-10-CM | POA: Diagnosis not present

## 2019-03-09 DIAGNOSIS — N179 Acute kidney failure, unspecified: Secondary | ICD-10-CM | POA: Diagnosis not present

## 2019-03-09 DIAGNOSIS — I499 Cardiac arrhythmia, unspecified: Secondary | ICD-10-CM | POA: Diagnosis not present

## 2019-03-09 DIAGNOSIS — Z79899 Other long term (current) drug therapy: Secondary | ICD-10-CM | POA: Diagnosis not present

## 2019-03-09 DIAGNOSIS — I252 Old myocardial infarction: Secondary | ICD-10-CM | POA: Diagnosis not present

## 2019-03-09 DIAGNOSIS — R0602 Shortness of breath: Secondary | ICD-10-CM | POA: Diagnosis not present

## 2019-03-10 DIAGNOSIS — N179 Acute kidney failure, unspecified: Secondary | ICD-10-CM | POA: Diagnosis not present

## 2019-03-10 DIAGNOSIS — I5023 Acute on chronic systolic (congestive) heart failure: Secondary | ICD-10-CM | POA: Diagnosis not present

## 2019-03-10 DIAGNOSIS — N183 Chronic kidney disease, stage 3 (moderate): Secondary | ICD-10-CM | POA: Diagnosis not present

## 2019-03-11 DIAGNOSIS — N183 Chronic kidney disease, stage 3 (moderate): Secondary | ICD-10-CM | POA: Diagnosis not present

## 2019-03-11 DIAGNOSIS — I5023 Acute on chronic systolic (congestive) heart failure: Secondary | ICD-10-CM | POA: Diagnosis not present

## 2019-03-11 DIAGNOSIS — N179 Acute kidney failure, unspecified: Secondary | ICD-10-CM | POA: Diagnosis not present

## 2019-03-13 DIAGNOSIS — N183 Chronic kidney disease, stage 3 (moderate): Secondary | ICD-10-CM | POA: Diagnosis not present

## 2019-03-13 DIAGNOSIS — Z79899 Other long term (current) drug therapy: Secondary | ICD-10-CM | POA: Diagnosis not present

## 2019-03-13 DIAGNOSIS — Z9119 Patient's noncompliance with other medical treatment and regimen: Secondary | ICD-10-CM | POA: Diagnosis not present

## 2019-03-13 DIAGNOSIS — E1122 Type 2 diabetes mellitus with diabetic chronic kidney disease: Secondary | ICD-10-CM | POA: Diagnosis not present

## 2019-03-13 DIAGNOSIS — Z7901 Long term (current) use of anticoagulants: Secondary | ICD-10-CM | POA: Diagnosis not present

## 2019-03-13 DIAGNOSIS — I482 Chronic atrial fibrillation, unspecified: Secondary | ICD-10-CM | POA: Diagnosis not present

## 2019-03-13 DIAGNOSIS — Z95810 Presence of automatic (implantable) cardiac defibrillator: Secondary | ICD-10-CM | POA: Diagnosis not present

## 2019-03-13 DIAGNOSIS — I130 Hypertensive heart and chronic kidney disease with heart failure and stage 1 through stage 4 chronic kidney disease, or unspecified chronic kidney disease: Secondary | ICD-10-CM | POA: Diagnosis not present

## 2019-03-13 DIAGNOSIS — Z794 Long term (current) use of insulin: Secondary | ICD-10-CM | POA: Diagnosis not present

## 2019-03-13 DIAGNOSIS — J439 Emphysema, unspecified: Secondary | ICD-10-CM | POA: Diagnosis not present

## 2019-03-13 DIAGNOSIS — Z87891 Personal history of nicotine dependence: Secondary | ICD-10-CM | POA: Diagnosis not present

## 2019-03-13 DIAGNOSIS — Z791 Long term (current) use of non-steroidal anti-inflammatories (NSAID): Secondary | ICD-10-CM | POA: Diagnosis not present

## 2019-03-13 DIAGNOSIS — Z7951 Long term (current) use of inhaled steroids: Secondary | ICD-10-CM | POA: Diagnosis not present

## 2019-03-13 DIAGNOSIS — M1991 Primary osteoarthritis, unspecified site: Secondary | ICD-10-CM | POA: Diagnosis not present

## 2019-03-13 DIAGNOSIS — I5023 Acute on chronic systolic (congestive) heart failure: Secondary | ICD-10-CM | POA: Diagnosis not present

## 2019-03-13 DIAGNOSIS — E785 Hyperlipidemia, unspecified: Secondary | ICD-10-CM | POA: Diagnosis not present

## 2019-03-13 DIAGNOSIS — E611 Iron deficiency: Secondary | ICD-10-CM | POA: Diagnosis not present

## 2019-03-13 DIAGNOSIS — I252 Old myocardial infarction: Secondary | ICD-10-CM | POA: Diagnosis not present

## 2019-03-14 DIAGNOSIS — J439 Emphysema, unspecified: Secondary | ICD-10-CM | POA: Diagnosis not present

## 2019-03-14 DIAGNOSIS — I130 Hypertensive heart and chronic kidney disease with heart failure and stage 1 through stage 4 chronic kidney disease, or unspecified chronic kidney disease: Secondary | ICD-10-CM | POA: Diagnosis not present

## 2019-03-14 DIAGNOSIS — Z79899 Other long term (current) drug therapy: Secondary | ICD-10-CM | POA: Diagnosis not present

## 2019-03-14 DIAGNOSIS — Z9119 Patient's noncompliance with other medical treatment and regimen: Secondary | ICD-10-CM | POA: Diagnosis not present

## 2019-03-14 DIAGNOSIS — Z7901 Long term (current) use of anticoagulants: Secondary | ICD-10-CM | POA: Diagnosis not present

## 2019-03-14 DIAGNOSIS — Z87891 Personal history of nicotine dependence: Secondary | ICD-10-CM | POA: Diagnosis not present

## 2019-03-14 DIAGNOSIS — M1991 Primary osteoarthritis, unspecified site: Secondary | ICD-10-CM | POA: Diagnosis not present

## 2019-03-14 DIAGNOSIS — Z7951 Long term (current) use of inhaled steroids: Secondary | ICD-10-CM | POA: Diagnosis not present

## 2019-03-14 DIAGNOSIS — I5023 Acute on chronic systolic (congestive) heart failure: Secondary | ICD-10-CM | POA: Diagnosis not present

## 2019-03-14 DIAGNOSIS — I482 Chronic atrial fibrillation, unspecified: Secondary | ICD-10-CM | POA: Diagnosis not present

## 2019-03-14 DIAGNOSIS — Z791 Long term (current) use of non-steroidal anti-inflammatories (NSAID): Secondary | ICD-10-CM | POA: Diagnosis not present

## 2019-03-14 DIAGNOSIS — Z95810 Presence of automatic (implantable) cardiac defibrillator: Secondary | ICD-10-CM | POA: Diagnosis not present

## 2019-03-14 DIAGNOSIS — I252 Old myocardial infarction: Secondary | ICD-10-CM | POA: Diagnosis not present

## 2019-03-14 DIAGNOSIS — E611 Iron deficiency: Secondary | ICD-10-CM | POA: Diagnosis not present

## 2019-03-14 DIAGNOSIS — E1122 Type 2 diabetes mellitus with diabetic chronic kidney disease: Secondary | ICD-10-CM | POA: Diagnosis not present

## 2019-03-14 DIAGNOSIS — N183 Chronic kidney disease, stage 3 (moderate): Secondary | ICD-10-CM | POA: Diagnosis not present

## 2019-03-14 DIAGNOSIS — E785 Hyperlipidemia, unspecified: Secondary | ICD-10-CM | POA: Diagnosis not present

## 2019-03-14 DIAGNOSIS — Z794 Long term (current) use of insulin: Secondary | ICD-10-CM | POA: Diagnosis not present

## 2019-03-18 DIAGNOSIS — Z7901 Long term (current) use of anticoagulants: Secondary | ICD-10-CM | POA: Diagnosis not present

## 2019-03-18 DIAGNOSIS — J439 Emphysema, unspecified: Secondary | ICD-10-CM | POA: Diagnosis not present

## 2019-03-18 DIAGNOSIS — Z87891 Personal history of nicotine dependence: Secondary | ICD-10-CM | POA: Diagnosis not present

## 2019-03-18 DIAGNOSIS — Z79899 Other long term (current) drug therapy: Secondary | ICD-10-CM | POA: Diagnosis not present

## 2019-03-18 DIAGNOSIS — N183 Chronic kidney disease, stage 3 (moderate): Secondary | ICD-10-CM | POA: Diagnosis not present

## 2019-03-18 DIAGNOSIS — E611 Iron deficiency: Secondary | ICD-10-CM | POA: Diagnosis not present

## 2019-03-18 DIAGNOSIS — I482 Chronic atrial fibrillation, unspecified: Secondary | ICD-10-CM | POA: Diagnosis not present

## 2019-03-18 DIAGNOSIS — I130 Hypertensive heart and chronic kidney disease with heart failure and stage 1 through stage 4 chronic kidney disease, or unspecified chronic kidney disease: Secondary | ICD-10-CM | POA: Diagnosis not present

## 2019-03-18 DIAGNOSIS — Z791 Long term (current) use of non-steroidal anti-inflammatories (NSAID): Secondary | ICD-10-CM | POA: Diagnosis not present

## 2019-03-18 DIAGNOSIS — Z9119 Patient's noncompliance with other medical treatment and regimen: Secondary | ICD-10-CM | POA: Diagnosis not present

## 2019-03-18 DIAGNOSIS — I252 Old myocardial infarction: Secondary | ICD-10-CM | POA: Diagnosis not present

## 2019-03-18 DIAGNOSIS — M1991 Primary osteoarthritis, unspecified site: Secondary | ICD-10-CM | POA: Diagnosis not present

## 2019-03-18 DIAGNOSIS — E1122 Type 2 diabetes mellitus with diabetic chronic kidney disease: Secondary | ICD-10-CM | POA: Diagnosis not present

## 2019-03-18 DIAGNOSIS — I10 Essential (primary) hypertension: Secondary | ICD-10-CM | POA: Diagnosis not present

## 2019-03-18 DIAGNOSIS — E1165 Type 2 diabetes mellitus with hyperglycemia: Secondary | ICD-10-CM | POA: Diagnosis not present

## 2019-03-18 DIAGNOSIS — Z794 Long term (current) use of insulin: Secondary | ICD-10-CM | POA: Diagnosis not present

## 2019-03-18 DIAGNOSIS — E785 Hyperlipidemia, unspecified: Secondary | ICD-10-CM | POA: Diagnosis not present

## 2019-03-18 DIAGNOSIS — Z23 Encounter for immunization: Secondary | ICD-10-CM | POA: Diagnosis not present

## 2019-03-18 DIAGNOSIS — I5023 Acute on chronic systolic (congestive) heart failure: Secondary | ICD-10-CM | POA: Diagnosis not present

## 2019-03-18 DIAGNOSIS — Z7951 Long term (current) use of inhaled steroids: Secondary | ICD-10-CM | POA: Diagnosis not present

## 2019-03-18 DIAGNOSIS — Z95810 Presence of automatic (implantable) cardiac defibrillator: Secondary | ICD-10-CM | POA: Diagnosis not present

## 2019-03-19 DIAGNOSIS — Z95810 Presence of automatic (implantable) cardiac defibrillator: Secondary | ICD-10-CM | POA: Diagnosis not present

## 2019-03-19 DIAGNOSIS — E611 Iron deficiency: Secondary | ICD-10-CM | POA: Diagnosis not present

## 2019-03-19 DIAGNOSIS — E1122 Type 2 diabetes mellitus with diabetic chronic kidney disease: Secondary | ICD-10-CM | POA: Diagnosis not present

## 2019-03-19 DIAGNOSIS — Z7901 Long term (current) use of anticoagulants: Secondary | ICD-10-CM | POA: Diagnosis not present

## 2019-03-19 DIAGNOSIS — Z7951 Long term (current) use of inhaled steroids: Secondary | ICD-10-CM | POA: Diagnosis not present

## 2019-03-19 DIAGNOSIS — M1991 Primary osteoarthritis, unspecified site: Secondary | ICD-10-CM | POA: Diagnosis not present

## 2019-03-19 DIAGNOSIS — J439 Emphysema, unspecified: Secondary | ICD-10-CM | POA: Diagnosis not present

## 2019-03-19 DIAGNOSIS — Z79899 Other long term (current) drug therapy: Secondary | ICD-10-CM | POA: Diagnosis not present

## 2019-03-19 DIAGNOSIS — I252 Old myocardial infarction: Secondary | ICD-10-CM | POA: Diagnosis not present

## 2019-03-19 DIAGNOSIS — I5023 Acute on chronic systolic (congestive) heart failure: Secondary | ICD-10-CM | POA: Diagnosis not present

## 2019-03-19 DIAGNOSIS — I482 Chronic atrial fibrillation, unspecified: Secondary | ICD-10-CM | POA: Diagnosis not present

## 2019-03-19 DIAGNOSIS — Z87891 Personal history of nicotine dependence: Secondary | ICD-10-CM | POA: Diagnosis not present

## 2019-03-19 DIAGNOSIS — I130 Hypertensive heart and chronic kidney disease with heart failure and stage 1 through stage 4 chronic kidney disease, or unspecified chronic kidney disease: Secondary | ICD-10-CM | POA: Diagnosis not present

## 2019-03-19 DIAGNOSIS — Z791 Long term (current) use of non-steroidal anti-inflammatories (NSAID): Secondary | ICD-10-CM | POA: Diagnosis not present

## 2019-03-19 DIAGNOSIS — Z9119 Patient's noncompliance with other medical treatment and regimen: Secondary | ICD-10-CM | POA: Diagnosis not present

## 2019-03-19 DIAGNOSIS — N183 Chronic kidney disease, stage 3 (moderate): Secondary | ICD-10-CM | POA: Diagnosis not present

## 2019-03-19 DIAGNOSIS — Z794 Long term (current) use of insulin: Secondary | ICD-10-CM | POA: Diagnosis not present

## 2019-03-19 DIAGNOSIS — E785 Hyperlipidemia, unspecified: Secondary | ICD-10-CM | POA: Diagnosis not present

## 2019-03-20 DIAGNOSIS — I252 Old myocardial infarction: Secondary | ICD-10-CM | POA: Diagnosis not present

## 2019-03-20 DIAGNOSIS — J439 Emphysema, unspecified: Secondary | ICD-10-CM | POA: Diagnosis not present

## 2019-03-20 DIAGNOSIS — Z791 Long term (current) use of non-steroidal anti-inflammatories (NSAID): Secondary | ICD-10-CM | POA: Diagnosis not present

## 2019-03-20 DIAGNOSIS — I482 Chronic atrial fibrillation, unspecified: Secondary | ICD-10-CM | POA: Diagnosis not present

## 2019-03-20 DIAGNOSIS — I130 Hypertensive heart and chronic kidney disease with heart failure and stage 1 through stage 4 chronic kidney disease, or unspecified chronic kidney disease: Secondary | ICD-10-CM | POA: Diagnosis not present

## 2019-03-20 DIAGNOSIS — N1832 Chronic kidney disease, stage 3b: Secondary | ICD-10-CM | POA: Diagnosis not present

## 2019-03-20 DIAGNOSIS — M1991 Primary osteoarthritis, unspecified site: Secondary | ICD-10-CM | POA: Diagnosis not present

## 2019-03-20 DIAGNOSIS — Z87891 Personal history of nicotine dependence: Secondary | ICD-10-CM | POA: Diagnosis not present

## 2019-03-20 DIAGNOSIS — Z79899 Other long term (current) drug therapy: Secondary | ICD-10-CM | POA: Diagnosis not present

## 2019-03-20 DIAGNOSIS — E1122 Type 2 diabetes mellitus with diabetic chronic kidney disease: Secondary | ICD-10-CM | POA: Diagnosis not present

## 2019-03-20 DIAGNOSIS — Z9119 Patient's noncompliance with other medical treatment and regimen: Secondary | ICD-10-CM | POA: Diagnosis not present

## 2019-03-20 DIAGNOSIS — E611 Iron deficiency: Secondary | ICD-10-CM | POA: Diagnosis not present

## 2019-03-20 DIAGNOSIS — Z794 Long term (current) use of insulin: Secondary | ICD-10-CM | POA: Diagnosis not present

## 2019-03-20 DIAGNOSIS — Z95810 Presence of automatic (implantable) cardiac defibrillator: Secondary | ICD-10-CM | POA: Diagnosis not present

## 2019-03-20 DIAGNOSIS — E785 Hyperlipidemia, unspecified: Secondary | ICD-10-CM | POA: Diagnosis not present

## 2019-03-20 DIAGNOSIS — Z7951 Long term (current) use of inhaled steroids: Secondary | ICD-10-CM | POA: Diagnosis not present

## 2019-03-20 DIAGNOSIS — I5023 Acute on chronic systolic (congestive) heart failure: Secondary | ICD-10-CM | POA: Diagnosis not present

## 2019-03-20 DIAGNOSIS — Z7901 Long term (current) use of anticoagulants: Secondary | ICD-10-CM | POA: Diagnosis not present

## 2019-03-25 DIAGNOSIS — M1991 Primary osteoarthritis, unspecified site: Secondary | ICD-10-CM | POA: Diagnosis not present

## 2019-03-25 DIAGNOSIS — N1832 Chronic kidney disease, stage 3b: Secondary | ICD-10-CM | POA: Diagnosis not present

## 2019-03-25 DIAGNOSIS — E785 Hyperlipidemia, unspecified: Secondary | ICD-10-CM | POA: Diagnosis not present

## 2019-03-25 DIAGNOSIS — Z9119 Patient's noncompliance with other medical treatment and regimen: Secondary | ICD-10-CM | POA: Diagnosis not present

## 2019-03-25 DIAGNOSIS — Z95810 Presence of automatic (implantable) cardiac defibrillator: Secondary | ICD-10-CM | POA: Diagnosis not present

## 2019-03-25 DIAGNOSIS — Z794 Long term (current) use of insulin: Secondary | ICD-10-CM | POA: Diagnosis not present

## 2019-03-25 DIAGNOSIS — Z79899 Other long term (current) drug therapy: Secondary | ICD-10-CM | POA: Diagnosis not present

## 2019-03-25 DIAGNOSIS — E1122 Type 2 diabetes mellitus with diabetic chronic kidney disease: Secondary | ICD-10-CM | POA: Diagnosis not present

## 2019-03-25 DIAGNOSIS — J439 Emphysema, unspecified: Secondary | ICD-10-CM | POA: Diagnosis not present

## 2019-03-25 DIAGNOSIS — I252 Old myocardial infarction: Secondary | ICD-10-CM | POA: Diagnosis not present

## 2019-03-25 DIAGNOSIS — Z7951 Long term (current) use of inhaled steroids: Secondary | ICD-10-CM | POA: Diagnosis not present

## 2019-03-25 DIAGNOSIS — Z87891 Personal history of nicotine dependence: Secondary | ICD-10-CM | POA: Diagnosis not present

## 2019-03-25 DIAGNOSIS — I482 Chronic atrial fibrillation, unspecified: Secondary | ICD-10-CM | POA: Diagnosis not present

## 2019-03-25 DIAGNOSIS — Z7901 Long term (current) use of anticoagulants: Secondary | ICD-10-CM | POA: Diagnosis not present

## 2019-03-25 DIAGNOSIS — I5023 Acute on chronic systolic (congestive) heart failure: Secondary | ICD-10-CM | POA: Diagnosis not present

## 2019-03-25 DIAGNOSIS — E611 Iron deficiency: Secondary | ICD-10-CM | POA: Diagnosis not present

## 2019-03-25 DIAGNOSIS — Z791 Long term (current) use of non-steroidal anti-inflammatories (NSAID): Secondary | ICD-10-CM | POA: Diagnosis not present

## 2019-03-25 DIAGNOSIS — I130 Hypertensive heart and chronic kidney disease with heart failure and stage 1 through stage 4 chronic kidney disease, or unspecified chronic kidney disease: Secondary | ICD-10-CM | POA: Diagnosis not present

## 2019-03-26 DIAGNOSIS — Z794 Long term (current) use of insulin: Secondary | ICD-10-CM | POA: Diagnosis not present

## 2019-03-26 DIAGNOSIS — Z9119 Patient's noncompliance with other medical treatment and regimen: Secondary | ICD-10-CM | POA: Diagnosis not present

## 2019-03-26 DIAGNOSIS — Z79899 Other long term (current) drug therapy: Secondary | ICD-10-CM | POA: Diagnosis not present

## 2019-03-26 DIAGNOSIS — Z7951 Long term (current) use of inhaled steroids: Secondary | ICD-10-CM | POA: Diagnosis not present

## 2019-03-26 DIAGNOSIS — Z95810 Presence of automatic (implantable) cardiac defibrillator: Secondary | ICD-10-CM | POA: Diagnosis not present

## 2019-03-26 DIAGNOSIS — Z87891 Personal history of nicotine dependence: Secondary | ICD-10-CM | POA: Diagnosis not present

## 2019-03-26 DIAGNOSIS — E611 Iron deficiency: Secondary | ICD-10-CM | POA: Diagnosis not present

## 2019-03-26 DIAGNOSIS — I252 Old myocardial infarction: Secondary | ICD-10-CM | POA: Diagnosis not present

## 2019-03-26 DIAGNOSIS — I130 Hypertensive heart and chronic kidney disease with heart failure and stage 1 through stage 4 chronic kidney disease, or unspecified chronic kidney disease: Secondary | ICD-10-CM | POA: Diagnosis not present

## 2019-03-26 DIAGNOSIS — M1991 Primary osteoarthritis, unspecified site: Secondary | ICD-10-CM | POA: Diagnosis not present

## 2019-03-26 DIAGNOSIS — N1832 Chronic kidney disease, stage 3b: Secondary | ICD-10-CM | POA: Diagnosis not present

## 2019-03-26 DIAGNOSIS — Z7901 Long term (current) use of anticoagulants: Secondary | ICD-10-CM | POA: Diagnosis not present

## 2019-03-26 DIAGNOSIS — I482 Chronic atrial fibrillation, unspecified: Secondary | ICD-10-CM | POA: Diagnosis not present

## 2019-03-26 DIAGNOSIS — E1122 Type 2 diabetes mellitus with diabetic chronic kidney disease: Secondary | ICD-10-CM | POA: Diagnosis not present

## 2019-03-26 DIAGNOSIS — J439 Emphysema, unspecified: Secondary | ICD-10-CM | POA: Diagnosis not present

## 2019-03-26 DIAGNOSIS — E785 Hyperlipidemia, unspecified: Secondary | ICD-10-CM | POA: Diagnosis not present

## 2019-03-26 DIAGNOSIS — I5023 Acute on chronic systolic (congestive) heart failure: Secondary | ICD-10-CM | POA: Diagnosis not present

## 2019-03-26 DIAGNOSIS — Z791 Long term (current) use of non-steroidal anti-inflammatories (NSAID): Secondary | ICD-10-CM | POA: Diagnosis not present

## 2019-03-28 DIAGNOSIS — Z794 Long term (current) use of insulin: Secondary | ICD-10-CM | POA: Diagnosis not present

## 2019-03-28 DIAGNOSIS — J439 Emphysema, unspecified: Secondary | ICD-10-CM | POA: Diagnosis not present

## 2019-03-28 DIAGNOSIS — M1991 Primary osteoarthritis, unspecified site: Secondary | ICD-10-CM | POA: Diagnosis not present

## 2019-03-28 DIAGNOSIS — Z9119 Patient's noncompliance with other medical treatment and regimen: Secondary | ICD-10-CM | POA: Diagnosis not present

## 2019-03-28 DIAGNOSIS — Z791 Long term (current) use of non-steroidal anti-inflammatories (NSAID): Secondary | ICD-10-CM | POA: Diagnosis not present

## 2019-03-28 DIAGNOSIS — I130 Hypertensive heart and chronic kidney disease with heart failure and stage 1 through stage 4 chronic kidney disease, or unspecified chronic kidney disease: Secondary | ICD-10-CM | POA: Diagnosis not present

## 2019-03-28 DIAGNOSIS — E785 Hyperlipidemia, unspecified: Secondary | ICD-10-CM | POA: Diagnosis not present

## 2019-03-28 DIAGNOSIS — E1122 Type 2 diabetes mellitus with diabetic chronic kidney disease: Secondary | ICD-10-CM | POA: Diagnosis not present

## 2019-03-28 DIAGNOSIS — Z7951 Long term (current) use of inhaled steroids: Secondary | ICD-10-CM | POA: Diagnosis not present

## 2019-03-28 DIAGNOSIS — I5023 Acute on chronic systolic (congestive) heart failure: Secondary | ICD-10-CM | POA: Diagnosis not present

## 2019-03-28 DIAGNOSIS — Z7901 Long term (current) use of anticoagulants: Secondary | ICD-10-CM | POA: Diagnosis not present

## 2019-03-28 DIAGNOSIS — I252 Old myocardial infarction: Secondary | ICD-10-CM | POA: Diagnosis not present

## 2019-03-28 DIAGNOSIS — Z95810 Presence of automatic (implantable) cardiac defibrillator: Secondary | ICD-10-CM | POA: Diagnosis not present

## 2019-03-28 DIAGNOSIS — N1832 Chronic kidney disease, stage 3b: Secondary | ICD-10-CM | POA: Diagnosis not present

## 2019-03-28 DIAGNOSIS — I482 Chronic atrial fibrillation, unspecified: Secondary | ICD-10-CM | POA: Diagnosis not present

## 2019-03-28 DIAGNOSIS — Z87891 Personal history of nicotine dependence: Secondary | ICD-10-CM | POA: Diagnosis not present

## 2019-03-28 DIAGNOSIS — E611 Iron deficiency: Secondary | ICD-10-CM | POA: Diagnosis not present

## 2019-03-28 DIAGNOSIS — Z79899 Other long term (current) drug therapy: Secondary | ICD-10-CM | POA: Diagnosis not present

## 2019-03-31 ENCOUNTER — Ambulatory Visit: Payer: Medicare Other | Admitting: Cardiology

## 2019-03-31 ENCOUNTER — Encounter: Payer: Self-pay | Admitting: *Deleted

## 2019-03-31 DIAGNOSIS — Z95810 Presence of automatic (implantable) cardiac defibrillator: Secondary | ICD-10-CM | POA: Diagnosis not present

## 2019-03-31 DIAGNOSIS — I130 Hypertensive heart and chronic kidney disease with heart failure and stage 1 through stage 4 chronic kidney disease, or unspecified chronic kidney disease: Secondary | ICD-10-CM | POA: Diagnosis not present

## 2019-03-31 DIAGNOSIS — I482 Chronic atrial fibrillation, unspecified: Secondary | ICD-10-CM | POA: Diagnosis not present

## 2019-03-31 DIAGNOSIS — I5023 Acute on chronic systolic (congestive) heart failure: Secondary | ICD-10-CM | POA: Diagnosis not present

## 2019-03-31 DIAGNOSIS — E1122 Type 2 diabetes mellitus with diabetic chronic kidney disease: Secondary | ICD-10-CM | POA: Diagnosis not present

## 2019-03-31 DIAGNOSIS — E611 Iron deficiency: Secondary | ICD-10-CM | POA: Diagnosis not present

## 2019-03-31 DIAGNOSIS — M1991 Primary osteoarthritis, unspecified site: Secondary | ICD-10-CM | POA: Diagnosis not present

## 2019-03-31 DIAGNOSIS — N1832 Chronic kidney disease, stage 3b: Secondary | ICD-10-CM | POA: Diagnosis not present

## 2019-03-31 DIAGNOSIS — Z7901 Long term (current) use of anticoagulants: Secondary | ICD-10-CM | POA: Diagnosis not present

## 2019-03-31 DIAGNOSIS — I252 Old myocardial infarction: Secondary | ICD-10-CM | POA: Diagnosis not present

## 2019-03-31 DIAGNOSIS — Z87891 Personal history of nicotine dependence: Secondary | ICD-10-CM | POA: Diagnosis not present

## 2019-03-31 DIAGNOSIS — Z79899 Other long term (current) drug therapy: Secondary | ICD-10-CM | POA: Diagnosis not present

## 2019-03-31 DIAGNOSIS — J439 Emphysema, unspecified: Secondary | ICD-10-CM | POA: Diagnosis not present

## 2019-03-31 DIAGNOSIS — Z794 Long term (current) use of insulin: Secondary | ICD-10-CM | POA: Diagnosis not present

## 2019-03-31 DIAGNOSIS — Z791 Long term (current) use of non-steroidal anti-inflammatories (NSAID): Secondary | ICD-10-CM | POA: Diagnosis not present

## 2019-03-31 DIAGNOSIS — E785 Hyperlipidemia, unspecified: Secondary | ICD-10-CM | POA: Diagnosis not present

## 2019-03-31 DIAGNOSIS — Z7951 Long term (current) use of inhaled steroids: Secondary | ICD-10-CM | POA: Diagnosis not present

## 2019-03-31 DIAGNOSIS — Z9119 Patient's noncompliance with other medical treatment and regimen: Secondary | ICD-10-CM | POA: Diagnosis not present

## 2019-03-31 NOTE — Progress Notes (Deleted)
Cardiology Office Note:    Date:  03/31/2019   ID:  Michael [name], DOB January 29, 1946, MRN [id number]  PCP:  Paulina Fusi, MD  Cardiologist:  No primary care provider on file.  Electrophysiologist:  None   Referring MD: Paulina Fusi, MD     History of Present Illness:    Michael [name] is a 73 y.o. male with a hx of   Past Medical History:  Diagnosis Date   Arthritis    Cardiomyopathy (HCC)    Cataract    CHF (congestive heart failure) (HCC)    Chronic atrial fibrillation (HCC) 12/02/2014   Overview:  CHADS2=4   Chronic kidney disease, stage 3 unspecified 12/03/2014   Chronic pain    Chronic systolic heart failure (HCC) 12/02/2014   Overview:  Ef< 20% in 2015   COPD (chronic obstructive pulmonary disease) (HCC)    Current use of long term anticoagulation 08/23/2017   Diabetes mellitus without complication (HCC)    Dilated cardiomyopathy (HCC) 12/02/2014   Overview:  Normal coronary arteriography   Dizziness    Hyperlipidemia    Hypertension    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     Allergies:   Digoxin   Social History   Socioeconomic History   Marital status: Widowed    Spouse name: Not on file   Number of children: Not on file   Years of education: Not on file   Highest education level: Not on file  Occupational History   Not on file  Social Needs   Financial resource strain: Not on file   Food insecurity    Worry: Not on file    Inability: Not on file   Transportation needs    Medical: Not on file    Non-medical:  Not on file  Tobacco Use   Smoking status: Former Smoker   Smokeless tobacco: Never Used  Substance and Sexual Activity   Alcohol use: No   Drug use: No   Sexual activity: Not on file  Lifestyle   Physical activity    Days per week: Not on file    Minutes per session: Not on file   Stress: Not on file  Relationships   Social connections    Talks on phone: Not on file    Gets together: Not on file    Attends religious service: Not on file    Active member of club or organization: Not on file    Attends meetings of clubs or organizations: Not on file    Relationship status: Not on file  Other Topics Concern   Not on file  Social History Narrative   Not on file     Family History: The patient's family history includes Heart disease in his father.  ROS:   Review of Systems  Constitution: Negative for decreased appetite, fever and weight gain.  HENT: Negative for congestion, ear discharge, hoarse voice and sore throat.   Eyes: Negative for discharge, redness, vision loss in right eye and visual halos.  Cardiovascular: Negative for chest pain, dyspnea on  exertion, leg swelling, orthopnea and palpitations.  Respiratory: Negative for cough, hemoptysis, shortness of breath and snoring.   Endocrine: Negative for heat intolerance and polyphagia.  Hematologic/Lymphatic: Negative for bleeding problem. Does not bruise/bleed easily.  Skin: Negative for flushing, nail changes, rash and suspicious lesions.  Musculoskeletal: Negative for arthritis, joint pain, muscle cramps, myalgias, neck pain and stiffness.  Gastrointestinal: Negative for abdominal pain, bowel incontinence, diarrhea and excessive appetite.  Genitourinary: Negative for decreased libido, genital sores and incomplete emptying.  Neurological: Negative for brief paralysis, focal weakness, headaches and loss of balance.  Psychiatric/Behavioral: Negative for altered mental status, depression and suicidal ideas.    Allergic/Immunologic: Negative for HIV exposure and persistent infections.    EKGs/Labs/Other Studies Reviewed:    The following studies were reviewed today:   EKG:  The ekg ordered today demonstrates   Recent Labs: No results found for requested labs within last 8760 hours.  Recent Lipid Panel No results found for: CHOL, TRIG, HDL, CHOLHDL, VLDL, LDLCALC, LDLDIRECT  Physical Exam:    VS:  There were no vitals taken for this visit.    Wt Readings from Last 3 Encounters:  12/18/16 184 lb (83.5 kg)  12/05/16 184 lb (83.5 kg)     GEN: Well nourished, well developed in no acute distress HEENT: Normal NECK: No JVD; No carotid bruits LYMPHATICS: No lymphadenopathy CARDIAC: S1S2 noted,RRR, no murmurs, rubs, gallops RESPIRATORY:  Clear to auscultation without rales, wheezing or rhonchi  ABDOMEN: Soft, non-tender, non-distended, +bowel sounds, no guarding. EXTREMITIES: No edema, No cyanosis, no clubbing MUSCULOSKELETAL:  No edema; No deformity  SKIN: Warm and dry NEUROLOGIC:  Alert and oriented x 3, non-focal PSYCHIATRIC:  Normal affect, good insight  ASSESSMENT:    No diagnosis found. PLAN:    In order of problems listed above:  1.  The patient is in agreement with the above plan. The patient left the office in stable condition.  The patient will follow up in   Medication Adjustments/Labs and Tests Ordered: Current medicines are reviewed at length with the patient today.  Concerns regarding medicines are outlined above.  No orders of the defined types were placed in this encounter.  No orders of the defined types were placed in this encounter.   There are no Patient Instructions on file for this visit.   Adopting a Healthy Lifestyle.  Know what a healthy weight is for you (roughly BMI <25) and aim to maintain this   Aim for 7+ servings of fruits and vegetables daily   65-80+ fluid ounces of water or unsweet tea for healthy kidneys   Limit to max 1 drink of  alcohol per day; avoid smoking/tobacco   Limit animal fats in diet for cholesterol and heart health - choose grass fed whenever available   Avoid highly processed foods, and foods high in saturated/trans fats   Aim for low stress - take time to unwind and care for your mental health   Aim for 150 min of moderate intensity exercise weekly for heart health, and weights twice weekly for bone health   Aim for 7-9 hours of sleep daily   When it comes to diets, agreement about the perfect plan isnt easy to find, even among the experts. Experts at the [employer] developed an idea known as the Healthy Eating Plate. Just imagine a plate divided into logical, healthy portions.   The emphasis is on diet quality:   Load up on vegetables and fruits - one-half of your  plate: Aim for color and variety, and remember that potatoes dont count.   Go for whole grains - one-quarter of your plate: Whole wheat, barley, wheat berries, quinoa, oats, brown rice, and foods made with them. If you want pasta, go with whole wheat pasta.   Protein power - one-quarter of your plate: Fish, chicken, beans, and nuts are all healthy, versatile protein sources. Limit red meat.   The diet, however, does go beyond the plate, offering a few other suggestions.   Use healthy plant oils, such as olive, canola, soy, corn, sunflower and peanut. Check the labels, and avoid partially hydrogenated oil, which have unhealthy trans fats.   If youre thirsty, drink water. Coffee and tea are good in moderation, but skip sugary drinks and limit milk and dairy products to one or two daily servings.   The type of carbohydrate in the diet is more important than the amount. Some sources of carbohydrates, such as vegetables, fruits, whole grains, and beans-are healthier than others.   Finally, stay active  Signed, Thomasene Ripple, DO  03/31/2019 11:45 AM    [facility]

## 2019-04-03 DIAGNOSIS — E782 Mixed hyperlipidemia: Secondary | ICD-10-CM | POA: Diagnosis not present

## 2019-04-03 DIAGNOSIS — Z87891 Personal history of nicotine dependence: Secondary | ICD-10-CM | POA: Insufficient documentation

## 2019-04-03 DIAGNOSIS — I472 Ventricular tachycardia: Secondary | ICD-10-CM | POA: Diagnosis not present

## 2019-04-03 DIAGNOSIS — I420 Dilated cardiomyopathy: Secondary | ICD-10-CM | POA: Diagnosis not present

## 2019-04-03 DIAGNOSIS — Z95810 Presence of automatic (implantable) cardiac defibrillator: Secondary | ICD-10-CM | POA: Diagnosis not present

## 2019-04-03 DIAGNOSIS — I482 Chronic atrial fibrillation, unspecified: Secondary | ICD-10-CM | POA: Diagnosis not present

## 2019-04-08 DIAGNOSIS — M1991 Primary osteoarthritis, unspecified site: Secondary | ICD-10-CM | POA: Diagnosis not present

## 2019-04-08 DIAGNOSIS — I482 Chronic atrial fibrillation, unspecified: Secondary | ICD-10-CM | POA: Diagnosis not present

## 2019-04-08 DIAGNOSIS — I130 Hypertensive heart and chronic kidney disease with heart failure and stage 1 through stage 4 chronic kidney disease, or unspecified chronic kidney disease: Secondary | ICD-10-CM | POA: Diagnosis not present

## 2019-04-08 DIAGNOSIS — J439 Emphysema, unspecified: Secondary | ICD-10-CM | POA: Diagnosis not present

## 2019-04-08 DIAGNOSIS — Z791 Long term (current) use of non-steroidal anti-inflammatories (NSAID): Secondary | ICD-10-CM | POA: Diagnosis not present

## 2019-04-08 DIAGNOSIS — Z794 Long term (current) use of insulin: Secondary | ICD-10-CM | POA: Diagnosis not present

## 2019-04-08 DIAGNOSIS — I252 Old myocardial infarction: Secondary | ICD-10-CM | POA: Diagnosis not present

## 2019-04-08 DIAGNOSIS — Z9119 Patient's noncompliance with other medical treatment and regimen: Secondary | ICD-10-CM | POA: Diagnosis not present

## 2019-04-08 DIAGNOSIS — E1122 Type 2 diabetes mellitus with diabetic chronic kidney disease: Secondary | ICD-10-CM | POA: Diagnosis not present

## 2019-04-08 DIAGNOSIS — Z87891 Personal history of nicotine dependence: Secondary | ICD-10-CM | POA: Diagnosis not present

## 2019-04-08 DIAGNOSIS — Z7951 Long term (current) use of inhaled steroids: Secondary | ICD-10-CM | POA: Diagnosis not present

## 2019-04-08 DIAGNOSIS — E785 Hyperlipidemia, unspecified: Secondary | ICD-10-CM | POA: Diagnosis not present

## 2019-04-08 DIAGNOSIS — N1832 Chronic kidney disease, stage 3b: Secondary | ICD-10-CM | POA: Diagnosis not present

## 2019-04-08 DIAGNOSIS — Z79899 Other long term (current) drug therapy: Secondary | ICD-10-CM | POA: Diagnosis not present

## 2019-04-08 DIAGNOSIS — Z95810 Presence of automatic (implantable) cardiac defibrillator: Secondary | ICD-10-CM | POA: Diagnosis not present

## 2019-04-08 DIAGNOSIS — E611 Iron deficiency: Secondary | ICD-10-CM | POA: Diagnosis not present

## 2019-04-08 DIAGNOSIS — I5023 Acute on chronic systolic (congestive) heart failure: Secondary | ICD-10-CM | POA: Diagnosis not present

## 2019-04-08 DIAGNOSIS — Z7901 Long term (current) use of anticoagulants: Secondary | ICD-10-CM | POA: Diagnosis not present

## 2019-04-09 DIAGNOSIS — Z95810 Presence of automatic (implantable) cardiac defibrillator: Secondary | ICD-10-CM | POA: Diagnosis not present

## 2019-04-09 DIAGNOSIS — Z9119 Patient's noncompliance with other medical treatment and regimen: Secondary | ICD-10-CM | POA: Diagnosis not present

## 2019-04-09 DIAGNOSIS — Z7951 Long term (current) use of inhaled steroids: Secondary | ICD-10-CM | POA: Diagnosis not present

## 2019-04-09 DIAGNOSIS — N1832 Chronic kidney disease, stage 3b: Secondary | ICD-10-CM | POA: Diagnosis not present

## 2019-04-09 DIAGNOSIS — Z7901 Long term (current) use of anticoagulants: Secondary | ICD-10-CM | POA: Diagnosis not present

## 2019-04-09 DIAGNOSIS — Z79899 Other long term (current) drug therapy: Secondary | ICD-10-CM | POA: Diagnosis not present

## 2019-04-09 DIAGNOSIS — Z794 Long term (current) use of insulin: Secondary | ICD-10-CM | POA: Diagnosis not present

## 2019-04-09 DIAGNOSIS — E785 Hyperlipidemia, unspecified: Secondary | ICD-10-CM | POA: Diagnosis not present

## 2019-04-09 DIAGNOSIS — I5023 Acute on chronic systolic (congestive) heart failure: Secondary | ICD-10-CM | POA: Diagnosis not present

## 2019-04-09 DIAGNOSIS — E1122 Type 2 diabetes mellitus with diabetic chronic kidney disease: Secondary | ICD-10-CM | POA: Diagnosis not present

## 2019-04-09 DIAGNOSIS — I252 Old myocardial infarction: Secondary | ICD-10-CM | POA: Diagnosis not present

## 2019-04-09 DIAGNOSIS — I482 Chronic atrial fibrillation, unspecified: Secondary | ICD-10-CM | POA: Diagnosis not present

## 2019-04-09 DIAGNOSIS — I130 Hypertensive heart and chronic kidney disease with heart failure and stage 1 through stage 4 chronic kidney disease, or unspecified chronic kidney disease: Secondary | ICD-10-CM | POA: Diagnosis not present

## 2019-04-09 DIAGNOSIS — M1991 Primary osteoarthritis, unspecified site: Secondary | ICD-10-CM | POA: Diagnosis not present

## 2019-04-09 DIAGNOSIS — Z791 Long term (current) use of non-steroidal anti-inflammatories (NSAID): Secondary | ICD-10-CM | POA: Diagnosis not present

## 2019-04-09 DIAGNOSIS — E611 Iron deficiency: Secondary | ICD-10-CM | POA: Diagnosis not present

## 2019-04-09 DIAGNOSIS — Z87891 Personal history of nicotine dependence: Secondary | ICD-10-CM | POA: Diagnosis not present

## 2019-04-09 DIAGNOSIS — J439 Emphysema, unspecified: Secondary | ICD-10-CM | POA: Diagnosis not present

## 2019-04-10 DIAGNOSIS — I472 Ventricular tachycardia: Secondary | ICD-10-CM | POA: Diagnosis not present

## 2019-04-10 DIAGNOSIS — I482 Chronic atrial fibrillation, unspecified: Secondary | ICD-10-CM | POA: Diagnosis not present

## 2019-04-10 DIAGNOSIS — Z4502 Encounter for adjustment and management of automatic implantable cardiac defibrillator: Secondary | ICD-10-CM | POA: Diagnosis not present

## 2019-04-10 DIAGNOSIS — Z95810 Presence of automatic (implantable) cardiac defibrillator: Secondary | ICD-10-CM | POA: Diagnosis not present

## 2019-04-10 DIAGNOSIS — I420 Dilated cardiomyopathy: Secondary | ICD-10-CM | POA: Diagnosis not present

## 2019-04-15 DIAGNOSIS — M1991 Primary osteoarthritis, unspecified site: Secondary | ICD-10-CM | POA: Diagnosis not present

## 2019-04-15 DIAGNOSIS — Z95810 Presence of automatic (implantable) cardiac defibrillator: Secondary | ICD-10-CM | POA: Diagnosis not present

## 2019-04-15 DIAGNOSIS — I252 Old myocardial infarction: Secondary | ICD-10-CM | POA: Diagnosis not present

## 2019-04-15 DIAGNOSIS — Z794 Long term (current) use of insulin: Secondary | ICD-10-CM | POA: Diagnosis not present

## 2019-04-15 DIAGNOSIS — Z87891 Personal history of nicotine dependence: Secondary | ICD-10-CM | POA: Diagnosis not present

## 2019-04-15 DIAGNOSIS — E1122 Type 2 diabetes mellitus with diabetic chronic kidney disease: Secondary | ICD-10-CM | POA: Diagnosis not present

## 2019-04-15 DIAGNOSIS — N1832 Chronic kidney disease, stage 3b: Secondary | ICD-10-CM | POA: Diagnosis not present

## 2019-04-15 DIAGNOSIS — J439 Emphysema, unspecified: Secondary | ICD-10-CM | POA: Diagnosis not present

## 2019-04-15 DIAGNOSIS — Z791 Long term (current) use of non-steroidal anti-inflammatories (NSAID): Secondary | ICD-10-CM | POA: Diagnosis not present

## 2019-04-15 DIAGNOSIS — E611 Iron deficiency: Secondary | ICD-10-CM | POA: Diagnosis not present

## 2019-04-15 DIAGNOSIS — I5023 Acute on chronic systolic (congestive) heart failure: Secondary | ICD-10-CM | POA: Diagnosis not present

## 2019-04-15 DIAGNOSIS — Z79899 Other long term (current) drug therapy: Secondary | ICD-10-CM | POA: Diagnosis not present

## 2019-04-15 DIAGNOSIS — Z9119 Patient's noncompliance with other medical treatment and regimen: Secondary | ICD-10-CM | POA: Diagnosis not present

## 2019-04-15 DIAGNOSIS — I130 Hypertensive heart and chronic kidney disease with heart failure and stage 1 through stage 4 chronic kidney disease, or unspecified chronic kidney disease: Secondary | ICD-10-CM | POA: Diagnosis not present

## 2019-04-15 DIAGNOSIS — Z7951 Long term (current) use of inhaled steroids: Secondary | ICD-10-CM | POA: Diagnosis not present

## 2019-04-15 DIAGNOSIS — I482 Chronic atrial fibrillation, unspecified: Secondary | ICD-10-CM | POA: Diagnosis not present

## 2019-04-15 DIAGNOSIS — E785 Hyperlipidemia, unspecified: Secondary | ICD-10-CM | POA: Diagnosis not present

## 2019-04-15 DIAGNOSIS — Z7901 Long term (current) use of anticoagulants: Secondary | ICD-10-CM | POA: Diagnosis not present

## 2019-04-17 DIAGNOSIS — E1165 Type 2 diabetes mellitus with hyperglycemia: Secondary | ICD-10-CM | POA: Diagnosis not present

## 2019-04-17 DIAGNOSIS — Z9181 History of falling: Secondary | ICD-10-CM | POA: Diagnosis not present

## 2019-04-17 DIAGNOSIS — I5023 Acute on chronic systolic (congestive) heart failure: Secondary | ICD-10-CM | POA: Diagnosis not present

## 2019-04-17 DIAGNOSIS — Z23 Encounter for immunization: Secondary | ICD-10-CM | POA: Diagnosis not present

## 2019-04-17 DIAGNOSIS — E785 Hyperlipidemia, unspecified: Secondary | ICD-10-CM | POA: Diagnosis not present

## 2019-04-17 DIAGNOSIS — Z0000 Encounter for general adult medical examination without abnormal findings: Secondary | ICD-10-CM | POA: Diagnosis not present

## 2019-04-17 DIAGNOSIS — I10 Essential (primary) hypertension: Secondary | ICD-10-CM | POA: Diagnosis not present

## 2019-04-17 DIAGNOSIS — E1122 Type 2 diabetes mellitus with diabetic chronic kidney disease: Secondary | ICD-10-CM | POA: Diagnosis not present

## 2019-04-22 DIAGNOSIS — I482 Chronic atrial fibrillation, unspecified: Secondary | ICD-10-CM | POA: Diagnosis not present

## 2019-04-22 DIAGNOSIS — Z794 Long term (current) use of insulin: Secondary | ICD-10-CM | POA: Diagnosis not present

## 2019-04-22 DIAGNOSIS — Z95810 Presence of automatic (implantable) cardiac defibrillator: Secondary | ICD-10-CM | POA: Diagnosis not present

## 2019-04-22 DIAGNOSIS — J439 Emphysema, unspecified: Secondary | ICD-10-CM | POA: Diagnosis not present

## 2019-04-22 DIAGNOSIS — E785 Hyperlipidemia, unspecified: Secondary | ICD-10-CM | POA: Diagnosis not present

## 2019-04-22 DIAGNOSIS — E1122 Type 2 diabetes mellitus with diabetic chronic kidney disease: Secondary | ICD-10-CM | POA: Diagnosis not present

## 2019-04-22 DIAGNOSIS — Z7951 Long term (current) use of inhaled steroids: Secondary | ICD-10-CM | POA: Diagnosis not present

## 2019-04-22 DIAGNOSIS — Z7901 Long term (current) use of anticoagulants: Secondary | ICD-10-CM | POA: Diagnosis not present

## 2019-04-22 DIAGNOSIS — Z79899 Other long term (current) drug therapy: Secondary | ICD-10-CM | POA: Diagnosis not present

## 2019-04-22 DIAGNOSIS — M1991 Primary osteoarthritis, unspecified site: Secondary | ICD-10-CM | POA: Diagnosis not present

## 2019-04-22 DIAGNOSIS — I252 Old myocardial infarction: Secondary | ICD-10-CM | POA: Diagnosis not present

## 2019-04-22 DIAGNOSIS — Z9119 Patient's noncompliance with other medical treatment and regimen: Secondary | ICD-10-CM | POA: Diagnosis not present

## 2019-04-22 DIAGNOSIS — E611 Iron deficiency: Secondary | ICD-10-CM | POA: Diagnosis not present

## 2019-04-22 DIAGNOSIS — Z791 Long term (current) use of non-steroidal anti-inflammatories (NSAID): Secondary | ICD-10-CM | POA: Diagnosis not present

## 2019-04-22 DIAGNOSIS — N1832 Chronic kidney disease, stage 3b: Secondary | ICD-10-CM | POA: Diagnosis not present

## 2019-04-22 DIAGNOSIS — I130 Hypertensive heart and chronic kidney disease with heart failure and stage 1 through stage 4 chronic kidney disease, or unspecified chronic kidney disease: Secondary | ICD-10-CM | POA: Diagnosis not present

## 2019-04-22 DIAGNOSIS — I5023 Acute on chronic systolic (congestive) heart failure: Secondary | ICD-10-CM | POA: Diagnosis not present

## 2019-04-22 DIAGNOSIS — Z87891 Personal history of nicotine dependence: Secondary | ICD-10-CM | POA: Diagnosis not present

## 2019-04-23 DIAGNOSIS — E785 Hyperlipidemia, unspecified: Secondary | ICD-10-CM | POA: Diagnosis not present

## 2019-04-23 DIAGNOSIS — Z794 Long term (current) use of insulin: Secondary | ICD-10-CM | POA: Diagnosis not present

## 2019-04-23 DIAGNOSIS — E611 Iron deficiency: Secondary | ICD-10-CM | POA: Diagnosis not present

## 2019-04-23 DIAGNOSIS — I482 Chronic atrial fibrillation, unspecified: Secondary | ICD-10-CM | POA: Diagnosis not present

## 2019-04-23 DIAGNOSIS — Z9119 Patient's noncompliance with other medical treatment and regimen: Secondary | ICD-10-CM | POA: Diagnosis not present

## 2019-04-23 DIAGNOSIS — Z791 Long term (current) use of non-steroidal anti-inflammatories (NSAID): Secondary | ICD-10-CM | POA: Diagnosis not present

## 2019-04-23 DIAGNOSIS — Z95810 Presence of automatic (implantable) cardiac defibrillator: Secondary | ICD-10-CM | POA: Diagnosis not present

## 2019-04-23 DIAGNOSIS — Z7901 Long term (current) use of anticoagulants: Secondary | ICD-10-CM | POA: Diagnosis not present

## 2019-04-23 DIAGNOSIS — N1832 Chronic kidney disease, stage 3b: Secondary | ICD-10-CM | POA: Diagnosis not present

## 2019-04-23 DIAGNOSIS — J439 Emphysema, unspecified: Secondary | ICD-10-CM | POA: Diagnosis not present

## 2019-04-23 DIAGNOSIS — I130 Hypertensive heart and chronic kidney disease with heart failure and stage 1 through stage 4 chronic kidney disease, or unspecified chronic kidney disease: Secondary | ICD-10-CM | POA: Diagnosis not present

## 2019-04-23 DIAGNOSIS — Z7951 Long term (current) use of inhaled steroids: Secondary | ICD-10-CM | POA: Diagnosis not present

## 2019-04-23 DIAGNOSIS — M1991 Primary osteoarthritis, unspecified site: Secondary | ICD-10-CM | POA: Diagnosis not present

## 2019-04-23 DIAGNOSIS — Z79899 Other long term (current) drug therapy: Secondary | ICD-10-CM | POA: Diagnosis not present

## 2019-04-23 DIAGNOSIS — I5023 Acute on chronic systolic (congestive) heart failure: Secondary | ICD-10-CM | POA: Diagnosis not present

## 2019-04-23 DIAGNOSIS — I252 Old myocardial infarction: Secondary | ICD-10-CM | POA: Diagnosis not present

## 2019-04-23 DIAGNOSIS — Z87891 Personal history of nicotine dependence: Secondary | ICD-10-CM | POA: Diagnosis not present

## 2019-04-23 DIAGNOSIS — E1122 Type 2 diabetes mellitus with diabetic chronic kidney disease: Secondary | ICD-10-CM | POA: Diagnosis not present

## 2019-05-05 DIAGNOSIS — Z9119 Patient's noncompliance with other medical treatment and regimen: Secondary | ICD-10-CM | POA: Diagnosis not present

## 2019-05-05 DIAGNOSIS — Z79899 Other long term (current) drug therapy: Secondary | ICD-10-CM | POA: Diagnosis not present

## 2019-05-05 DIAGNOSIS — Z87891 Personal history of nicotine dependence: Secondary | ICD-10-CM | POA: Diagnosis not present

## 2019-05-05 DIAGNOSIS — J439 Emphysema, unspecified: Secondary | ICD-10-CM | POA: Diagnosis not present

## 2019-05-05 DIAGNOSIS — Z95810 Presence of automatic (implantable) cardiac defibrillator: Secondary | ICD-10-CM | POA: Diagnosis not present

## 2019-05-05 DIAGNOSIS — Z7951 Long term (current) use of inhaled steroids: Secondary | ICD-10-CM | POA: Diagnosis not present

## 2019-05-05 DIAGNOSIS — N1832 Chronic kidney disease, stage 3b: Secondary | ICD-10-CM | POA: Diagnosis not present

## 2019-05-05 DIAGNOSIS — E611 Iron deficiency: Secondary | ICD-10-CM | POA: Diagnosis not present

## 2019-05-05 DIAGNOSIS — Z7901 Long term (current) use of anticoagulants: Secondary | ICD-10-CM | POA: Diagnosis not present

## 2019-05-05 DIAGNOSIS — I252 Old myocardial infarction: Secondary | ICD-10-CM | POA: Diagnosis not present

## 2019-05-05 DIAGNOSIS — I130 Hypertensive heart and chronic kidney disease with heart failure and stage 1 through stage 4 chronic kidney disease, or unspecified chronic kidney disease: Secondary | ICD-10-CM | POA: Diagnosis not present

## 2019-05-05 DIAGNOSIS — Z794 Long term (current) use of insulin: Secondary | ICD-10-CM | POA: Diagnosis not present

## 2019-05-05 DIAGNOSIS — Z791 Long term (current) use of non-steroidal anti-inflammatories (NSAID): Secondary | ICD-10-CM | POA: Diagnosis not present

## 2019-05-05 DIAGNOSIS — E1122 Type 2 diabetes mellitus with diabetic chronic kidney disease: Secondary | ICD-10-CM | POA: Diagnosis not present

## 2019-05-05 DIAGNOSIS — I5023 Acute on chronic systolic (congestive) heart failure: Secondary | ICD-10-CM | POA: Diagnosis not present

## 2019-05-05 DIAGNOSIS — M1991 Primary osteoarthritis, unspecified site: Secondary | ICD-10-CM | POA: Diagnosis not present

## 2019-05-05 DIAGNOSIS — E785 Hyperlipidemia, unspecified: Secondary | ICD-10-CM | POA: Diagnosis not present

## 2019-05-05 DIAGNOSIS — I482 Chronic atrial fibrillation, unspecified: Secondary | ICD-10-CM | POA: Diagnosis not present

## 2019-05-19 DIAGNOSIS — I509 Heart failure, unspecified: Secondary | ICD-10-CM | POA: Diagnosis not present

## 2019-05-19 DIAGNOSIS — I10 Essential (primary) hypertension: Secondary | ICD-10-CM | POA: Diagnosis not present

## 2019-05-19 DIAGNOSIS — N189 Chronic kidney disease, unspecified: Secondary | ICD-10-CM | POA: Diagnosis not present

## 2019-07-22 DIAGNOSIS — E1122 Type 2 diabetes mellitus with diabetic chronic kidney disease: Secondary | ICD-10-CM | POA: Diagnosis not present

## 2019-07-22 DIAGNOSIS — E785 Hyperlipidemia, unspecified: Secondary | ICD-10-CM | POA: Diagnosis not present

## 2019-07-22 DIAGNOSIS — I509 Heart failure, unspecified: Secondary | ICD-10-CM | POA: Diagnosis not present

## 2019-07-22 DIAGNOSIS — I482 Chronic atrial fibrillation, unspecified: Secondary | ICD-10-CM | POA: Diagnosis not present

## 2019-07-22 DIAGNOSIS — N189 Chronic kidney disease, unspecified: Secondary | ICD-10-CM | POA: Diagnosis not present

## 2019-09-12 DIAGNOSIS — E782 Mixed hyperlipidemia: Secondary | ICD-10-CM | POA: Diagnosis not present

## 2019-09-12 DIAGNOSIS — I2510 Atherosclerotic heart disease of native coronary artery without angina pectoris: Secondary | ICD-10-CM | POA: Insufficient documentation

## 2019-09-12 DIAGNOSIS — I472 Ventricular tachycardia: Secondary | ICD-10-CM | POA: Diagnosis not present

## 2019-09-12 DIAGNOSIS — I482 Chronic atrial fibrillation, unspecified: Secondary | ICD-10-CM | POA: Diagnosis not present

## 2019-09-12 DIAGNOSIS — I420 Dilated cardiomyopathy: Secondary | ICD-10-CM | POA: Diagnosis not present

## 2019-10-28 DIAGNOSIS — I509 Heart failure, unspecified: Secondary | ICD-10-CM | POA: Diagnosis not present

## 2019-10-28 DIAGNOSIS — I482 Chronic atrial fibrillation, unspecified: Secondary | ICD-10-CM | POA: Diagnosis not present

## 2019-10-28 DIAGNOSIS — E785 Hyperlipidemia, unspecified: Secondary | ICD-10-CM | POA: Diagnosis not present

## 2019-10-28 DIAGNOSIS — N189 Chronic kidney disease, unspecified: Secondary | ICD-10-CM | POA: Diagnosis not present

## 2019-10-28 DIAGNOSIS — E1122 Type 2 diabetes mellitus with diabetic chronic kidney disease: Secondary | ICD-10-CM | POA: Diagnosis not present

## 2019-11-10 ENCOUNTER — Ambulatory Visit (INDEPENDENT_AMBULATORY_CARE_PROVIDER_SITE_OTHER): Payer: Medicare Other | Admitting: Podiatry

## 2019-11-10 ENCOUNTER — Other Ambulatory Visit: Payer: Self-pay | Admitting: Podiatry

## 2019-11-10 DIAGNOSIS — Z5329 Procedure and treatment not carried out because of patient's decision for other reasons: Secondary | ICD-10-CM

## 2019-11-10 DIAGNOSIS — M79672 Pain in left foot: Secondary | ICD-10-CM

## 2019-11-10 NOTE — Progress Notes (Signed)
No show for appt. 

## 2020-09-05 ENCOUNTER — Encounter (HOSPITAL_COMMUNITY): Payer: Self-pay

## 2020-09-05 ENCOUNTER — Inpatient Hospital Stay (HOSPITAL_COMMUNITY)
Admission: AD | Admit: 2020-09-05 | Discharge: 2020-09-10 | DRG: 245 | Disposition: A | Discharge status: Home / Self Care | Payer: Medicare Other | Source: Other Acute Inpatient Hospital | Attending: Internal Medicine | Admitting: Internal Medicine

## 2020-09-05 DIAGNOSIS — I4510 Unspecified right bundle-branch block: Secondary | ICD-10-CM | POA: Diagnosis present

## 2020-09-05 DIAGNOSIS — Z794 Long term (current) use of insulin: Secondary | ICD-10-CM | POA: Diagnosis not present

## 2020-09-05 DIAGNOSIS — I472 Ventricular tachycardia: Secondary | ICD-10-CM | POA: Diagnosis present

## 2020-09-05 DIAGNOSIS — U071 COVID-19: Secondary | ICD-10-CM | POA: Diagnosis present

## 2020-09-05 DIAGNOSIS — I130 Hypertensive heart and chronic kidney disease with heart failure and stage 1 through stage 4 chronic kidney disease, or unspecified chronic kidney disease: Principal | ICD-10-CM | POA: Diagnosis present

## 2020-09-05 DIAGNOSIS — I081 Rheumatic disorders of both mitral and tricuspid valves: Secondary | ICD-10-CM | POA: Diagnosis present

## 2020-09-05 DIAGNOSIS — I5082 Biventricular heart failure: Secondary | ICD-10-CM | POA: Diagnosis present

## 2020-09-05 DIAGNOSIS — T82111A Breakdown (mechanical) of cardiac pulse generator (battery), initial encounter: Secondary | ICD-10-CM | POA: Diagnosis present

## 2020-09-05 DIAGNOSIS — E1122 Type 2 diabetes mellitus with diabetic chronic kidney disease: Secondary | ICD-10-CM | POA: Diagnosis present

## 2020-09-05 DIAGNOSIS — I420 Dilated cardiomyopathy: Secondary | ICD-10-CM | POA: Diagnosis present

## 2020-09-05 DIAGNOSIS — E785 Hyperlipidemia, unspecified: Secondary | ICD-10-CM | POA: Diagnosis present

## 2020-09-05 DIAGNOSIS — I272 Pulmonary hypertension, unspecified: Secondary | ICD-10-CM | POA: Diagnosis present

## 2020-09-05 DIAGNOSIS — Z95810 Presence of automatic (implantable) cardiac defibrillator: Secondary | ICD-10-CM

## 2020-09-05 DIAGNOSIS — Z87891 Personal history of nicotine dependence: Secondary | ICD-10-CM

## 2020-09-05 DIAGNOSIS — Y838 Other surgical procedures as the cause of abnormal reaction of the patient, or of later complication, without mention of misadventure at the time of the procedure: Secondary | ICD-10-CM | POA: Diagnosis present

## 2020-09-05 DIAGNOSIS — I2510 Atherosclerotic heart disease of native coronary artery without angina pectoris: Secondary | ICD-10-CM | POA: Diagnosis present

## 2020-09-05 DIAGNOSIS — Z79891 Long term (current) use of opiate analgesic: Secondary | ICD-10-CM

## 2020-09-05 DIAGNOSIS — I82B22 Chronic embolism and thrombosis of left subclavian vein: Secondary | ICD-10-CM | POA: Diagnosis present

## 2020-09-05 DIAGNOSIS — G8929 Other chronic pain: Secondary | ICD-10-CM | POA: Diagnosis present

## 2020-09-05 DIAGNOSIS — Z4502 Encounter for adjustment and management of automatic implantable cardiac defibrillator: Secondary | ICD-10-CM | POA: Diagnosis not present

## 2020-09-05 DIAGNOSIS — I428 Other cardiomyopathies: Secondary | ICD-10-CM | POA: Diagnosis not present

## 2020-09-05 DIAGNOSIS — I4821 Permanent atrial fibrillation: Secondary | ICD-10-CM | POA: Diagnosis present

## 2020-09-05 DIAGNOSIS — Z7901 Long term (current) use of anticoagulants: Secondary | ICD-10-CM | POA: Diagnosis not present

## 2020-09-05 DIAGNOSIS — N179 Acute kidney failure, unspecified: Secondary | ICD-10-CM | POA: Diagnosis not present

## 2020-09-05 DIAGNOSIS — I959 Hypotension, unspecified: Secondary | ICD-10-CM | POA: Diagnosis not present

## 2020-09-05 DIAGNOSIS — Z79899 Other long term (current) drug therapy: Secondary | ICD-10-CM

## 2020-09-05 DIAGNOSIS — N1832 Chronic kidney disease, stage 3b: Secondary | ICD-10-CM | POA: Diagnosis present

## 2020-09-05 DIAGNOSIS — I5023 Acute on chronic systolic (congestive) heart failure: Secondary | ICD-10-CM | POA: Diagnosis present

## 2020-09-05 DIAGNOSIS — J449 Chronic obstructive pulmonary disease, unspecified: Secondary | ICD-10-CM | POA: Diagnosis present

## 2020-09-05 DIAGNOSIS — Z7951 Long term (current) use of inhaled steroids: Secondary | ICD-10-CM

## 2020-09-05 DIAGNOSIS — I5022 Chronic systolic (congestive) heart failure: Secondary | ICD-10-CM | POA: Diagnosis not present

## 2020-09-05 DIAGNOSIS — Z888 Allergy status to other drugs, medicaments and biological substances status: Secondary | ICD-10-CM

## 2020-09-05 DIAGNOSIS — I493 Ventricular premature depolarization: Secondary | ICD-10-CM | POA: Diagnosis present

## 2020-09-05 DIAGNOSIS — Z8249 Family history of ischemic heart disease and other diseases of the circulatory system: Secondary | ICD-10-CM

## 2020-09-05 LAB — CBC WITH DIFFERENTIAL/PLATELET
Abs Immature Granulocytes: 0.02 10*3/uL (ref 0.00–0.07)
Basophils Absolute: 0.1 10*3/uL (ref 0.0–0.1)
Basophils Relative: 1 %
Eosinophils Absolute: 0.1 10*3/uL (ref 0.0–0.5)
Eosinophils Relative: 1 %
HCT: 43.8 % (ref 39.0–52.0)
Hemoglobin: 14.3 g/dL (ref 13.0–17.0)
Immature Granulocytes: 0 %
Lymphocytes Relative: 22 %
Lymphs Abs: 1.4 10*3/uL (ref 0.7–4.0)
MCH: 29.7 pg (ref 26.0–34.0)
MCHC: 32.6 g/dL (ref 30.0–36.0)
MCV: 90.9 fL (ref 80.0–100.0)
Monocytes Absolute: 0.6 10*3/uL (ref 0.1–1.0)
Monocytes Relative: 10 %
Neutro Abs: 4 10*3/uL (ref 1.7–7.7)
Neutrophils Relative %: 66 %
Platelets: 166 10*3/uL (ref 150–400)
RBC: 4.82 MIL/uL (ref 4.22–5.81)
RDW: 13.8 % (ref 11.5–15.5)
WBC: 6.1 10*3/uL (ref 4.0–10.5)
nRBC: 0 % (ref 0.0–0.2)

## 2020-09-05 LAB — COMPREHENSIVE METABOLIC PANEL
ALT: 23 U/L (ref 0–44)
AST: 26 U/L (ref 15–41)
Albumin: 3.2 g/dL — ABNORMAL LOW (ref 3.5–5.0)
Alkaline Phosphatase: 78 U/L (ref 38–126)
Anion gap: 8 (ref 5–15)
BUN: 52 mg/dL — ABNORMAL HIGH (ref 8–23)
CO2: 18 mmol/L — ABNORMAL LOW (ref 22–32)
Calcium: 8.4 mg/dL — ABNORMAL LOW (ref 8.9–10.3)
Chloride: 109 mmol/L (ref 98–111)
Creatinine, Ser: 1.76 mg/dL — ABNORMAL HIGH (ref 0.61–1.24)
GFR, Estimated: 40 mL/min — ABNORMAL LOW (ref >60)
Glucose, Bld: 96 mg/dL (ref 70–99)
Potassium: 4.3 mmol/L (ref 3.5–5.1)
Sodium: 135 mmol/L (ref 135–145)
Total Bilirubin: 1.5 mg/dL — ABNORMAL HIGH (ref 0.3–1.2)
Total Protein: 6.5 g/dL (ref 6.5–8.1)

## 2020-09-05 LAB — PROTIME-INR
INR: 2.7 — ABNORMAL HIGH (ref 0.8–1.2)
Prothrombin Time: 27.5 seconds — ABNORMAL HIGH (ref 11.4–15.2)

## 2020-09-05 LAB — GLUCOSE, CAPILLARY: Glucose-Capillary: 90 mg/dL (ref 70–99)

## 2020-09-05 LAB — TSH: TSH: 0.855 u[IU]/mL (ref 0.350–4.500)

## 2020-09-05 LAB — BRAIN NATRIURETIC PEPTIDE: B Natriuretic Peptide: 1045.3 pg/mL — ABNORMAL HIGH (ref 0.0–100.0)

## 2020-09-05 LAB — MAGNESIUM: Magnesium: 2.1 mg/dL (ref 1.7–2.4)

## 2020-09-05 MED ORDER — METOPROLOL SUCCINATE ER 25 MG PO TB24
25.0000 mg | ORAL_TABLET | Freq: Every day | ORAL | Status: DC
  Administered 2020-09-05 – 2020-09-10 (×5): 25 mg via ORAL
  Filled 2020-09-05 (×6): qty 1

## 2020-09-05 MED ORDER — CHLORHEXIDINE GLUCONATE CLOTH 2 % EX PADS
6.0000 | MEDICATED_PAD | Freq: Every day | CUTANEOUS | Status: DC
  Administered 2020-09-05: 6 via TOPICAL

## 2020-09-05 MED ORDER — RIVAROXABAN 15 MG PO TABS
15.0000 mg | ORAL_TABLET | Freq: Every day | ORAL | Status: DC
  Administered 2020-09-05: 15 mg via ORAL
  Filled 2020-09-05 (×2): qty 1

## 2020-09-05 MED ORDER — ACETAMINOPHEN 325 MG PO TABS: 650.0000 mg | ORAL_TABLET | ORAL | Status: DC | PRN

## 2020-09-05 MED ORDER — ONDANSETRON HCL 4 MG/2ML IJ SOLN: 4.0000 mg | Freq: Four times a day (QID) | INTRAMUSCULAR | Status: DC | PRN

## 2020-09-05 MED ORDER — FUROSEMIDE 10 MG/ML IJ SOLN
40.0000 mg | Freq: Every day | INTRAMUSCULAR | Status: DC
  Administered 2020-09-05 – 2020-09-07 (×3): 40 mg via INTRAVENOUS
  Filled 2020-09-05 (×3): qty 4

## 2020-09-05 NOTE — H&P (Signed)
Cardiology Admission History and Physical:   Patient ID: Michael [name]; [id number]; 04-Jul-1945   Admission date: 09/05/2020  Primary Care Provider: Hurshel Party, NP Primary Cardiologist:   Marla Roe., MD  [address]  [place], [place] [address]    Chief Complaint:    Patient Profile:   Michael [name] is a 75 y.o. male with a history of dilated nonischemic cardiomyopathy and LVEF 20 to 25%, Medtronic biventricular ICD in place, hypertension, hyperlipidemia, mild CAD at cardiac catheterization 2020, COPD, CKD stage IIIb, and permanent atrial fibrillation.  History of Present Illness:   Michael [name] is admitted in transfer from [facility].  Patient is not clear about regularity of his cardiology follow-up, some information available in Care Everywhere, previous testing from [facility] reviewed as well.  He appears to follow with Dr. Desma Maxim and had a visit in March 2021.  Symptoms include recent orthopnea and PND, suspected "extra fluid" although he is not certain about baseline weight.  This has been present over the last week or so.  It does not appear that he takes his diuretics regularly, known medications are listed below.  He reports a significant sense of orthopnea prompting his evaluation in [place], could not breathe and had to set up several times at night.  He was found to be in rapid atrial fibrillation on initial assessment, also with salvos of NSVT.  ICD was interrogated while in the ER and device reportedly passed ERI last May, currently not functioning.  I do not have the actual interrogation, but ER provider note from [facility] indicates device interrogation did show several episodes of VT/VF that were not treated. Transfer requested for EP evaluation in light of need to replace ICD generator, also for further medication adjustments. He does not report any sense of palpitations, states that he has had no episodes of dizziness or syncope.  Chest x-ray report from  [facility] indicate stable cardiomegaly with no acute process.  Troponin I normal.  Patient arrived in the ICU at [facility] at which point telephone call received from [facility] that patient had tested positive for COVID-19, I was not aware of this when excepting transfer.  Patient does not report any recent cough, no fevers or chills, states that he is vaccinated and has received a booster.  Medication list reviewed with patient's pharmacy.  Past Medical History:  Diagnosis Date  . Arthritis   . Cataract   . Chronic atrial fibrillation (HCC)   . Chronic kidney disease, stage 3 unspecified (HCC)   . Chronic pain   . Chronic systolic heart failure (HCC)    LVEF 20 to 25%  . COPD (chronic obstructive pulmonary disease) (HCC)   . Current use of long term anticoagulation   . Dilated cardiomyopathy (HCC)    Mild CAD at cardiac catheterization 2020  . Hyperlipidemia   . Hypertension   . Presence of automatic implantable cardioverter-defibrillator    Medtronic biventricular ICD  . Type 2 diabetes mellitus (HCC)   . Ventricular tachycardia [facility]     Past Surgical History:  Procedure Laterality Date  . EYE SURGERY    . ICD IMPLANT    . SPINE SURGERY       Medications Prior to Admission: Prior to Admission medications   Medication Sig Start Date End Date Taking? Authorizing Provider  atorvastatin (LIPITOR) 20 MG tablet Take 20 mg by mouth daily.    [provider]  budesonide-formoterol (SYMBICORT) 160-4.5 MCG/ACT inhaler Inhale 2  puffs into the lungs 2 (two) times daily. 06/22/20   [provider]  febuxostat (ULORIC) 40 MG tablet Take 40 mg by mouth daily. 06/15/20   [provider]  furosemide (LASIX) 40 MG tablet Take 40 mg by mouth daily.    [provider]  hydrALAZINE (APRESOLINE) 25 MG tablet Take 12.5 mg by mouth 2 (two) times daily.     [provider]  insulin degludec (TRESIBA) 100 UNIT/ML SOPN FlexTouch Pen Inject 8  Units into the skin daily at 10 pm.    [provider]  lisinopril (PRINIVIL,ZESTRIL) 5 MG tablet Take 5 mg by mouth daily.    [provider]  metoprolol succinate (TOPROL-XL) 25 MG 24 hr tablet Take 25 mg by mouth daily. 08/06/20   [provider]  Rivaroxaban (XARELTO) 15 MG TABS tablet Take 15 mg by mouth daily.    [provider]  spironolactone (ALDACTONE) 25 MG tablet Take 12.5 mg by mouth daily. 05/31/20   [provider]  spironolactone (ALDACTONE) 50 MG tablet Take 25 mg by mouth daily.    [provider]  traMADol-acetaminophen (ULTRACET) 37.5-325 MG tablet Take 1 tablet by mouth as needed.    [provider]     Allergies:    Allergies  Allergen Reactions  . Digoxin Shortness Of Breath    Social History:   Social History   Tobacco Use  . Smoking status: Former Smoker    Types: Cigarettes  . Smokeless tobacco: Never Used  Substance Use Topics  . Alcohol use: No    Family History:  The patient's family history includes Heart disease in his father.    ROS:  Please see the history of present illness.  Mild leg swelling.  All other ROS reviewed and negative.     Physical Exam/Data:   Vitals:   09/05/20 1500  BP: 98/75  Pulse: (!) 101  Resp: 16  Temp: 98.6 F (37 C)  TempSrc: Oral  SpO2: 96%   No intake or output data in the 24 hours ending 09/05/20 1543 There were no vitals filed for this visit. There is no height or weight on file to calculate BMI.   Gen: Chronically ill-appearing male, no distress. HEENT: Conjunctiva and lids normal, oropharynx clear. Neck: Supple, elevated JVP, no carotid bruits. Lungs: Clear to auscultation, nonlabored breathing at rest. Thorax: Device pocket site left upper chest. Cardiac: Indistinct PMI, irregularly irregular, soft systolic murmur at the apex, no pericardial rub. Abdomen: Soft, nontender, bowel sounds present. Extremities: Mild lower leg edema, distal  pulses 2+. Skin: Warm and dry. Musculoskeletal: No kyphosis. Neuropsychiatric: Alert and oriented x3, affect grossly appropriate.   EKG:  An ECG dated 09/05/2020 was personally reviewed today and demonstrated:  Atrial fibrillation with RVR and IVCD.  Relevant CV Studies:  Echocardiogram 08/08/2018 [facility]): SUMMARY  The left ventricle is moderately dilated.  Mild left ventricular hypertrophy   Left ventricular systolic function is severely reduced.  LV ejection fraction = 15-20%.  Unable to fully assess LV regional wall motion  The right ventricle is normal size.  The right ventricular systolic function is mild to moderately reduced.  Device lead in the right ventricle  The left atrium is mildly to moderately dilated.  The right atrium is moderately dilated.  There is mild to moderate mitral regurgitation.  There is mild to moderate tricuspid regurgitation.  No significant stenosis seen  Estimated right ventricular systolic pressure is 51 mmHg.  Moderate pulmonary hypertension.  There is  no pericardial effusion.  There is no comparison study available.   Cardiac catheterization 08/15/2018 [facility]): FINDINGS/RECOMMENDATIONS:   1. Moderate scattered fluoroscopic coronary calcification.   2. Only mild coronary plaques - worst is LAD is distal focal 30%.  Cx prox 10-15%.  Very dominant RCA 10% irregularities in mid and distal segments.   Laboratory Data:  [facility]: SARS coronavirus 2 test positive, WBC 6.4, hemoglobin 14.6, platelets 161, potassium 4.4, BUN 52, creatinine 1.7, troponin I 0.03, NT-proBNP 6570, AST 40, ALT 28, INR 1.4   Assessment and Plan:   1.  Recent orthopnea and PND, suspected acute on chronic systolic heart failure with component of fluid overload although baseline weight is not certain.  NT-proBNP 6570, chest x-ray without pulmonary edema.  Not clear that he has been taking his diuretics regularly.  2.  Medtronic biventricular ICD past ERI and not  functioning per verbal report from ER provider following device interrogation at [facility].  Patient transferred for EP consultation regarding generator change.  3.  Incidentally noted COVID-19 positivity.  This was checked as he was leaving [facility].  He does not report any fevers or chills, no cough or GI symptoms, no headache.  Chest x-ray shows no infiltrates.  Reports being fully vaccinated and with booster.  4.  Permanent atrial fibrillation, CHA2DS2-VASc score is 5.  He is on Xarelto 15 mg daily as an outpatient for stroke prophylaxis.  5.  History of recurrent VT.  Records indicate episode of several device shocks back in 2020, evaluation at [facility].  He was placed on amiodarone at that time with cardiac catheterization showing only mild CAD.  Does not appear that he is on amiodarone at this time.  6.  Dilated nonischemic cardiomyopathy, previous LVEF 20 to 25% in 2020.  7.  CKD stage IIIb, creatinine 1.7 per outside lab work.  Patient will be admitted under isolation, looking into transfer to 6E.  Transition to Lasix 40 mg IV daily.  Hold Aldactone, hydralazine and lisinopril for now.  Continue Toprol-XL, Xarelto and Lipitor.  Echocardiogram will be obtained.  Request EP consultation.  Anticipate further medication adjustments, likely a transition to [place].  Signed, Nona Dell, MD  09/05/2020 3:43 PM

## 2020-09-06 ENCOUNTER — Inpatient Hospital Stay (HOSPITAL_COMMUNITY): Payer: Medicare Other

## 2020-09-06 ENCOUNTER — Other Ambulatory Visit: Payer: Self-pay

## 2020-09-06 ENCOUNTER — Encounter (HOSPITAL_COMMUNITY)
Admission: AD | Disposition: A | Discharge status: Home / Self Care | Payer: Self-pay | Source: Other Acute Inpatient Hospital | Attending: Internal Medicine

## 2020-09-06 ENCOUNTER — Encounter (HOSPITAL_COMMUNITY): Payer: Self-pay | Admitting: Cardiology

## 2020-09-06 DIAGNOSIS — I472 Ventricular tachycardia: Secondary | ICD-10-CM | POA: Diagnosis not present

## 2020-09-06 DIAGNOSIS — N1832 Chronic kidney disease, stage 3b: Secondary | ICD-10-CM | POA: Diagnosis not present

## 2020-09-06 DIAGNOSIS — U071 COVID-19: Secondary | ICD-10-CM | POA: Diagnosis not present

## 2020-09-06 DIAGNOSIS — I4821 Permanent atrial fibrillation: Secondary | ICD-10-CM | POA: Diagnosis not present

## 2020-09-06 DIAGNOSIS — I5022 Chronic systolic (congestive) heart failure: Secondary | ICD-10-CM

## 2020-09-06 DIAGNOSIS — I5023 Acute on chronic systolic (congestive) heart failure: Secondary | ICD-10-CM | POA: Diagnosis not present

## 2020-09-06 LAB — BASIC METABOLIC PANEL
Anion gap: 9 (ref 5–15)
BUN: 57 mg/dL — ABNORMAL HIGH (ref 8–23)
CO2: 17 mmol/L — ABNORMAL LOW (ref 22–32)
Calcium: 8.8 mg/dL — ABNORMAL LOW (ref 8.9–10.3)
Chloride: 109 mmol/L (ref 98–111)
Creatinine, Ser: 2.01 mg/dL — ABNORMAL HIGH (ref 0.61–1.24)
GFR, Estimated: 34 mL/min — ABNORMAL LOW (ref >60)
Glucose, Bld: 105 mg/dL — ABNORMAL HIGH (ref 70–99)
Potassium: 5 mmol/L (ref 3.5–5.1)
Sodium: 135 mmol/L (ref 135–145)

## 2020-09-06 LAB — ECHOCARDIOGRAM LIMITED
Area-P 1/2: 7.44 cm2
Height: 62 in
S' Lateral: 5.7 cm
Weight: 2633.6 oz

## 2020-09-06 LAB — GLUCOSE, CAPILLARY
Glucose-Capillary: 126 mg/dL — ABNORMAL HIGH (ref 70–99)
Glucose-Capillary: 92 mg/dL (ref 70–99)
Glucose-Capillary: 112 mg/dL — ABNORMAL HIGH (ref 70–99)

## 2020-09-06 LAB — SURGICAL PCR SCREEN
MRSA, PCR: NEGATIVE
Staphylococcus aureus: NEGATIVE

## 2020-09-06 LAB — HEMOGLOBIN A1C
Hgb A1c MFr Bld: 6.6 % — ABNORMAL HIGH (ref 4.8–5.6)
Mean Plasma Glucose: 142.72 mg/dL

## 2020-09-06 SURGERY — ICD GENERATOR CHANGEOUT

## 2020-09-06 MED ORDER — PERFLUTREN LIPID MICROSPHERE
1.0000 mL | INTRAVENOUS | Status: AC | PRN
  Administered 2020-09-06: 2 mL via INTRAVENOUS
  Filled 2020-09-06: qty 10

## 2020-09-06 MED ORDER — CHLORHEXIDINE GLUCONATE 4 % EX LIQD
60.0000 mL | Freq: Once | CUTANEOUS | Status: AC
  Administered 2020-09-06: 4 via TOPICAL
  Filled 2020-09-06: qty 60

## 2020-09-06 MED ORDER — SODIUM CHLORIDE 0.9 % IV SOLN: INTRAVENOUS | Status: DC

## 2020-09-06 MED ORDER — INSULIN ASPART 100 UNIT/ML ~~LOC~~ SOLN: 0.0000 [IU] | Freq: Every day | SUBCUTANEOUS | Status: DC

## 2020-09-06 MED ORDER — SODIUM CHLORIDE 0.9% FLUSH: 3.0000 mL | INTRAVENOUS | Status: DC | PRN

## 2020-09-06 MED ORDER — CEFAZOLIN SODIUM-DEXTROSE 2-4 GM/100ML-% IV SOLN
2.0000 g | INTRAVENOUS | Status: AC
  Filled 2020-09-06: qty 100

## 2020-09-06 MED ORDER — SODIUM CHLORIDE 0.9 % IV SOLN
80.0000 mg | INTRAVENOUS | Status: AC
  Filled 2020-09-06: qty 2

## 2020-09-06 MED ORDER — FUROSEMIDE 10 MG/ML IJ SOLN
80.0000 mg | Freq: Once | INTRAMUSCULAR | Status: AC
  Administered 2020-09-06: 80 mg via INTRAVENOUS
  Filled 2020-09-06: qty 8

## 2020-09-06 MED ORDER — INSULIN ASPART 100 UNIT/ML ~~LOC~~ SOLN
0.0000 [IU] | Freq: Three times a day (TID) | SUBCUTANEOUS | Status: DC
  Administered 2020-09-07 – 2020-09-08 (×2): 2 [IU] via SUBCUTANEOUS

## 2020-09-06 NOTE — Consult Note (Addendum)
ELECTROPHYSIOLOGY CONSULT NOTE    Patient ID: Michael [name] MRN: [id number], DOB/AGE: Feb 02, 1946 76 y.o.  Admit date: 09/05/2020 Date of Consult: 09/06/2020  Primary Physician: Hurshel Party, NP Primary Cardiologist: No primary care provider on file.  Electrophysiologist: New  Referring Provider: Dr. Diona Browner  Patient Profile: Michael [name] is a 75 y.o. male with a history of dilated NICM, LVEF 20-25% s/p MDT BiV ICD, HTN, HLD, mild CAD at cath 2020, COPD, CKD IIIb, and permanent AF who is being seen today for the evaluation of AF RVR, NSVT, and device at [facility] at the request of Dr. Diona Browner.  HPI:  Chief Walkup is a 75 y.o. male with complicated medical history as above. Pt presented to [place] hospital with orthopnea and PND as well as "extra fluid" though patient is a poor historian.  Pt reported not taking his medications including diuretics regularly.   On presentation he was found to have AF with RVR And frequent PVCs and NSVT.  ICD interrogation reported device at [facility] as of 10/2019, included several episodes of VT/VF that were not treated.   Pt was transferred to [facility] prior to COVID test was reported positive. Patient has denied any recent cough, fever, chills. He is vaccinated and boosted.   He denies chest pain, palpitations, dyspnea, PND, orthopnea, nausea, vomiting, dizziness, syncope, edema, weight gain, or early satiety.  Past Medical History:  Diagnosis Date   Arthritis    Cataract    Chronic atrial fibrillation (HCC)    Chronic kidney disease, stage 3 unspecified (HCC)    Chronic pain    Chronic systolic heart failure (HCC)    LVEF 20 to 25%   COPD (chronic obstructive pulmonary disease) (HCC)    Current use of long term anticoagulation    Dilated cardiomyopathy (HCC)    Mild CAD at cardiac catheterization 2020   Hyperlipidemia    Hypertension    Presence of automatic implantable cardioverter-defibrillator    Medtronic biventricular ICD   Type 2  diabetes mellitus (HCC)    Ventricular tachycardia (HCC)      Surgical History:  Past Surgical History:  Procedure Laterality Date   EYE SURGERY     ICD IMPLANT     SPINE SURGERY       Medications Prior to Admission  Medication Sig Dispense Refill Last Dose   atorvastatin (LIPITOR) 20 MG tablet Take 20 mg by mouth daily.   Past Week at Unknown time   budesonide-formoterol (SYMBICORT) 160-4.5 MCG/ACT inhaler Inhale 2 puffs into the lungs 2 (two) times daily.   Past Week at Unknown time   febuxostat (ULORIC) 40 MG tablet Take 40 mg by mouth daily.   Past Week at Unknown time   furosemide (LASIX) 40 MG tablet Take 40 mg by mouth daily.   Past Week at Unknown time   lisinopril (PRINIVIL,ZESTRIL) 5 MG tablet Take 5 mg by mouth daily.   Past Week at Unknown time   metoprolol succinate (TOPROL-XL) 25 MG 24 hr tablet Take 25 mg by mouth daily.   Past Week at Unknown time   Rivaroxaban (XARELTO) 15 MG TABS tablet Take 15 mg by mouth daily.   Past Week at Unknown time   spironolactone (ALDACTONE) 25 MG tablet Take 12.5 mg by mouth daily.   Past Week at Unknown time   hydrALAZINE (APRESOLINE) 25 MG tablet Take 12.5 mg by mouth 2 (two) times daily.  (Patient not taking: Reported on 09/05/2020)   Not Taking at Unknown time  insulin degludec (TRESIBA) 100 UNIT/ML SOPN FlexTouch Pen Inject 8 Units into the skin daily at 10 pm.      spironolactone (ALDACTONE) 50 MG tablet Take 25 mg by mouth daily. (Patient not taking: Reported on 09/05/2020)   Not Taking at Unknown time   traMADol-acetaminophen (ULTRACET) 37.5-325 MG tablet Take 1 tablet by mouth as needed. (Patient not taking: Reported on 09/05/2020)   Completed Course at Unknown time    Inpatient Medications:   furosemide  40 mg Intravenous Daily   gentamicin irrigation  80 mg Irrigation To Cath   metoprolol succinate  25 mg Oral Daily   rivaroxaban  15 mg Oral Q supper    Allergies:  Allergies  Allergen Reactions    Digoxin Shortness Of Breath    Social History   Socioeconomic History   Marital status: Widowed    Spouse name: Not on file   Number of children: Not on file   Years of education: Not on file   Highest education level: Not on file  Occupational History   Occupation: retired  Tobacco Use   Smoking status: Former Smoker    Types: Cigarettes   Smokeless tobacco: Never Used  Building services engineer Use: Never used  Substance and Sexual Activity   Alcohol use: No   Drug use: No   Sexual activity: Not on file  Other Topics Concern   Not on file  Social History Narrative   Lives with grandson. Does not drive.    Social Determinants of Health   Financial Resource Strain: Not on file  Food Insecurity: Not on file  Transportation Needs: Not on file  Physical Activity: Not on file  Stress: Not on file  Social Connections: Not on file  Intimate Partner Violence: Not on file     Family History  Problem Relation Age of Onset   Heart disease Father      Review of Systems: All other systems reviewed and are otherwise negative except as noted above.  Physical Exam: Vitals:   09/06/20 0957 09/06/20 1012 09/06/20 1121 09/06/20 1126  BP: 103/83 93/75 (!) 86/73 95/77  Pulse: 65 (!) 124 97 (!) 107  Resp: 18  20 18   Temp:   98 F (36.7 C)   TempSrc:   Oral   SpO2: 96% 97% 100% 96%  Weight:      Height:        GEN- The patient is well appearing, alert and oriented x 3 today.   HEENT: normocephalic, atraumatic; sclera clear, conjunctiva pink; hearing intact; oropharynx clear; neck supple Lungs- Clear to ausculation bilaterally, normal work of breathing.  No wheezes, rales, rhonchi Heart- Regular rate and rhythm, no murmurs, rubs or gallops GI- soft, non-tender, non-distended, bowel sounds present Extremities- no clubbing, cyanosis, or edema; DP/PT/radial pulses 2+ bilaterally MS- no significant deformity or atrophy Skin- warm and dry, no rash or lesion Psych-  euthymic mood, full affect Neuro- strength and sensation are intact  Labs:   Lab Results  Component Value Date   WBC 6.1 09/05/2020   HGB 14.3 09/05/2020   HCT 43.8 09/05/2020   MCV 90.9 09/05/2020   PLT 166 09/05/2020    Recent Labs  Lab 09/05/20 1703 09/06/20 0522  NA 135 135  K 4.3 5.0  CL 109 109  CO2 18* 17*  BUN 52* 57*  CREATININE 1.76* 2.01*  CALCIUM 8.4* 8.8*  PROT 6.5  --   BILITOT 1.5*  --   ALKPHOS 78  --  ALT 23  --   AST 26  --   GLUCOSE 96 105*      Radiology/Studies: No results found.  EKG: on arrival shows AF at 100 bpm with RBBB pattern with QRS 166 ms (personally reviewed)   TELEMETRY: AF with rates 100-120s, very frequent PVCS (personally reviewed)  DEVICE HISTORY: ? MDT Biv ICD implanted WF. ERI as of 10/2019  .        Assessment/Plan: 1. Chronic systolic CHF Reports show last LVEF 20-25% in 2020 Pt on Toprol and lasix at home NT pro BNP 6570 on arrival.  Device interrogation confirms device at [facility]. Per [facility] notes (03/2019) pt "Dual site RV pacing with the LV port programmed Subtreshold "   He was then lost to / or non compliant with EP follow up.  Echo shows a severely hypokinetic LV with severe dilation.  Will ask our HF colleagues to weigh in re: cardiomyopathy.   2. H/o VT Reports shocks as recently as 2020 and previously on amiodarone.  Cath at that time with only mild CAD  Patient will require ICD generator replacement. Prior monitored VT episodes appear to be AF with RVR. I discussed the ICD generator replacement in detail with the patient including the risks and recovery and he wishes to proceed.   Patient will require ICD generator replacement but the quuestion is timing and whether LV lead intervention/revision is required/possible.  Will start with a 2v CXR to document lead position.  3. CKD IIIb CR 1.7 -> 2.0  4. Permanent AF Xarelto given last night, 09/05/20.   CHA2DS2VASC of at least 6. Xarelto dose adjusted  for CKD IIIB.    Hold xarelto.  Will need AVJ eventually but will require optimization of his current biV system.  4. COVID+ Incidental. Asymptomatic.  Pt is vaccinated and boosted.   For questions or updates, please contact [facility] Please consult [email or web address] for contact info under Cardiology/STEMI.   Sheria Lang T. Lalla Brothers, MD, [facility] Cardiac Electrophysiology

## 2020-09-06 NOTE — Consult Note (Addendum)
Advanced Heart Failure Team Consult Note   Primary Physician: Hurshel Party, NP PCP-Cardiologist:  [facility]   Reason for Consultation: acute on chronic systolic heart failure   HPI:    Michael [name] is seen today for evaluation of acute on chronic systolic heart failure at the request of Dr. Lalla Brothers, Electrophysiology.   75 y/o male, primarily followed by [facility] cardiology, w/ nonischemic, dilated CM. Echo in 2020 showed severely reduced LVEF 15-20%. Cath showed only mild, nonobstructive CAD (30% dLAD, 10-15% pLCx and 10% m-dRCA). Also w/ VT s/p MDT BiV ICD, chronic atrial fibrillation on Xarelto, stage III CKD and HLD.   He initially presented to [facility] w/ orthopnea and PND. ICD was interrogated and discovered to be past ERI since last May and not functioning. He was transferred to [facility] for EP evaluation and gen change.   On arrival to [facility], he was incidentally found to be COVID +. Asymptomatic, vaccinated and boosted. BNP 1,045.   2D Echo done here w/ EF severely reduced, appears < 10%. Full report pending.   He was placed on IV Lasix 40 mg daily and reports improved symptoms. SCr 1.76>>2.0. Home lisinopril and spiro on hold. On Toprol XL 25 daily. In rate controlled Afib, 80s-90s w/ frequent NSVT (4-5 beats). K 5.0. Mg 2.1     Review of Systems: [y] = yes, [ ]  = no    General: Weight gain [ ] ; Weight loss [ ] ; Anorexia [ ] ; Fatigue [ ] ; Fever [ ] ; Chills [ ] ; Weakness [ ]    Cardiac: Chest pain/pressure [ ] ; Resting SOB [ ] ; Exertional SOB [Y]; Orthopnea [Y ]; Pedal Edema [ ] ; Palpitations [ ] ; Syncope [ ] ; Presyncope [ ] ; Paroxysmal nocturnal dyspnea[Y ]   Pulmonary: Cough [ ] ; Wheezing[ ] ; Hemoptysis[ ] ; Sputum [ ] ; Snoring [ ]    GI: Vomiting[ ] ; Dysphagia[ ] ; Melena[ ] ; Hematochezia [ ] ; Heartburn[ ] ; Abdominal pain [ ] ; Constipation [ ] ; Diarrhea [ ] ; BRBPR [ ]    GU: Hematuria[ ] ; Dysuria [ ] ; Nocturia[ ]    Vascular: Pain in legs with walking [ ] ; Pain in feet  with lying flat [ ] ; Non-healing sores [ ] ; Stroke [ ] ; TIA [ ] ; Slurred speech [ ] ;   Neuro: Headaches[ ] ; Vertigo[ ] ; Seizures[ ] ; Paresthesias[ ] ;Blurred vision [ ] ; Diplopia [ ] ; Vision changes [ ]    Ortho/Skin: Arthritis [ ] ; Joint pain [ ] ; Muscle pain [ ] ; Joint swelling [ ] ; Back Pain [ ] ; Rash [ ]    Psych: Depression[ ] ; Anxiety[ ]    Heme: Bleeding problems [ ] ; Clotting disorders [ ] ; Anemia [ ]    Endocrine: Diabetes [ ] ; Thyroid dysfunction[ ]   Home Medications Prior to Admission medications   Medication Sig Start Date End Date Taking? Authorizing Provider  atorvastatin (LIPITOR) 20 MG tablet Take 20 mg by mouth daily.   Yes [provider]  budesonide-formoterol (SYMBICORT) 160-4.5 MCG/ACT inhaler Inhale 2 puffs into the lungs 2 (two) times daily. 06/22/20  Yes [provider]  febuxostat (ULORIC) 40 MG tablet Take 40 mg by mouth daily. 06/15/20  Yes [provider]  furosemide (LASIX) 40 MG tablet Take 40 mg by mouth daily.   Yes [provider]  lisinopril (PRINIVIL,ZESTRIL) 5 MG tablet Take 5 mg by mouth daily.   Yes [provider]  metoprolol succinate (TOPROL-XL) 25 MG 24 hr tablet Take 25 mg by mouth daily. 08/06/20  Yes [provider]  Rivaroxaban (XARELTO) 15  MG TABS tablet Take 15 mg by mouth daily.   Yes [provider]  spironolactone (ALDACTONE) 25 MG tablet Take 12.5 mg by mouth daily. 05/31/20  Yes [provider]  hydrALAZINE (APRESOLINE) 25 MG tablet Take 12.5 mg by mouth 2 (two) times daily.  Patient not taking: Reported on 09/05/2020    [provider]  insulin degludec (TRESIBA) 100 UNIT/ML SOPN FlexTouch Pen Inject 8 Units into the skin daily at 10 pm.    [provider]  spironolactone (ALDACTONE) 50 MG tablet Take 25 mg by mouth daily. Patient not taking: Reported on 09/05/2020    [provider]  traMADol-acetaminophen (ULTRACET) 37.5-325 MG tablet Take 1  tablet by mouth as needed. Patient not taking: Reported on 09/05/2020    [provider]    Past Medical History: Past Medical History:  Diagnosis Date   Arthritis    Cataract    Chronic atrial fibrillation (HCC)    Chronic kidney disease, stage 3 unspecified (HCC)    Chronic pain    Chronic systolic heart failure (HCC)    LVEF 20 to 25%   COPD (chronic obstructive pulmonary disease) (HCC)    Current use of long term anticoagulation    Dilated cardiomyopathy (HCC)    Mild CAD at cardiac catheterization 2020   Hyperlipidemia    Hypertension    Presence of automatic implantable cardioverter-defibrillator    Medtronic biventricular ICD   Type 2 diabetes mellitus (HCC)    Ventricular tachycardia (HCC)     Past Surgical History: Past Surgical History:  Procedure Laterality Date   EYE SURGERY     ICD IMPLANT     SPINE SURGERY      Family History: Family History  Problem Relation Age of Onset   Heart disease Father     Social History: Social History   Socioeconomic History   Marital status: Widowed    Spouse name: Not on file   Number of children: Not on file   Years of education: Not on file   Highest education level: Not on file  Occupational History   Occupation: retired  Tobacco Use   Smoking status: Former Smoker    Types: Cigarettes   Smokeless tobacco: Never Used  Building services engineer Use: Never used  Substance and Sexual Activity   Alcohol use: No   Drug use: No   Sexual activity: Not on file  Other Topics Concern   Not on file  Social History Narrative   Lives with grandson. Does not drive.    Social Determinants of Health   Financial Resource Strain: Not on file  Food Insecurity: Not on file  Transportation Needs: Not on file  Physical Activity: Not on file  Stress: Not on file  Social Connections: Not on file    Allergies:  Allergies  Allergen Reactions   Digoxin Shortness Of Breath     Objective:    Vital Signs:   Temp:  [97.6 F (36.4 C)-98.6 F (37 C)] 98 F (36.7 C) (03/21 1121) Pulse Rate:  [65-124] 112 (03/21 1217) Resp:  [16-20] 16 (03/21 1217) BP: (86-104)/(64-83) 104/64 (03/21 1217) SpO2:  [88 %-100 %] 100 % (03/21 1217) Weight:  [74.1 kg-74.7 kg] 74.7 kg (03/21 0438) Last BM Date: 09/06/20  Weight change: Filed Weights   09/05/20 1800 09/06/20 0438  Weight: 74.1 kg 74.7 kg    Intake/Output:   Intake/Output Summary (Last 24 hours) at 09/06/2020 1345 Last data filed at 09/06/2020 1306 Gross  per 24 hour  Intake 512.12 ml  Output 400 ml  Net 112.12 ml      Physical Exam    General:  Well appearing. No resp difficulty HEENT: normal Neck: supple. JVP . Carotids 2+ bilat; no bruits. No lymphadenopathy or thyromegaly appreciated. Cor: PMI nondisplaced. Regular rate & rhythm. No rubs, gallops or murmurs. Lungs: clear Abdomen: soft, nontender, nondistended. No hepatosplenomegaly. No bruits or masses. Good bowel sounds. Extremities: no cyanosis, clubbing, rash, edema Neuro: alert & orientedx3, cranial nerves grossly intact. moves all 4 extremities w/o difficulty. Affect pleasant   Telemetry   Atrial fibrillation 80s-90s, NSVT (4-5 beat runs).   EKG    Afib w/ PVCs 101 bpm   Labs   Basic Metabolic Panel: Recent Labs  Lab 09/05/20 1703 09/06/20 0522  NA 135 135  K 4.3 5.0  CL 109 109  CO2 18* 17*  GLUCOSE 96 105*  BUN 52* 57*  CREATININE 1.76* 2.01*  CALCIUM 8.4* 8.8*  MG 2.1  --     Liver Function Tests: Recent Labs  Lab 09/05/20 1703  AST 26  ALT 23  ALKPHOS 78  BILITOT 1.5*  PROT 6.5  ALBUMIN 3.2*   No results for input(s): LIPASE, AMYLASE in the last 168 hours. No results for input(s): AMMONIA in the last 168 hours.  CBC: Recent Labs  Lab 09/05/20 1703  WBC 6.1  NEUTROABS 4.0  HGB 14.3  HCT 43.8  MCV 90.9  PLT 166    Cardiac Enzymes: No results for input(s): CKTOTAL, CKMB, CKMBINDEX, TROPONINI in  the last 168 hours.  BNP: BNP (last 3 results) Recent Labs    09/05/20 1703  BNP 1,045.3*    ProBNP (last 3 results) No results for input(s): PROBNP in the last 8760 hours.   CBG: Recent Labs  Lab 09/05/20 1454 09/06/20 1114  GLUCAP 90 126*    Coagulation Studies: Recent Labs    09/05/20 1703  LABPROT 27.5*  INR 2.7*     Imaging    No results found.   Medications:     Current Medications:  furosemide  40 mg Intravenous Daily   gentamicin irrigation  80 mg Irrigation To Cath   metoprolol succinate  25 mg Oral Daily   rivaroxaban  15 mg Oral Q supper     Infusions:  sodium chloride 50 mL/hr at 09/06/20 1306   sodium chloride 50 mL/hr at 09/06/20 1306    ceFAZolin (ANCEF) IV       Assessment/Plan   1. Acute on Chronic Systolic Heart Failure - Non-ischemic, DCM.  - Echo at [facility] 07/2018 EF 15-20%. Cath w/ mild nonobstructive CAD  - Echo here shows EF <10% - Admitted w/ NHYA Class III symptoms. BNP 1,045 - Pt notes subjective improvement w/ IV Lasix - Continue to hold home lisinopril and Cleda Daub w/ rising SCr and borderline high K  - May need RHC - no cMRI w/ CKD + device    2. ICD past ERI  - device nonfunctioning. Has h/o VT - EP following and planning gen change +/- LV lead intervention/revision  intervention/revision   3. COVID 19 Infection  - incidentally tested + on admit - asymptomatic - he is vaccinated and Boosted   4. H/o VT - Nonfunctioning ICD, past ERI  - plan gen change prior to d/c   5. Chronic Afib - rate controlled on  blocker - On chronic a/c w/ Xarelto (on hold for gen change)   6. CKD, Stage IIIb  -  per Care Everywhere, SCr in 2020 was ~2.3  - 1.8 on admit>>>2.0 today  - Monitor w/ diuresis - Continue to hold lisinopril and spiro for now - follow daily BMP     Length of Stay: 1  Brittainy Simmons, PA-C  09/06/2020, 1:45 PM  Advanced Heart Failure Team Pager [phone] (M-F; 7a - 5p)  Please contact  [facility] for night-coverage after hours (4p -7a ) and weekends on [email or web address]   Patient seen and examined with the above-signed Advanced Practice Provider and/or Housestaff. I personally reviewed laboratory data, imaging studies and relevant notes. I independently examined the patient and formulated the important aspects of the plan. I have edited the note to reflect any of my changes or salient points. I have personally discussed the plan with the patient and/or family.  75 y/o male with longstanding HF due to severe ICM with EF < 20%, chronic AF,h/o VT and CKD IIIb-IV.  According to Care Everywhere it looks like he is followed by Cardiology in [place] as part of [facility] system but he tells me he is also followed in [place]. He cannot tell me who the doctor is.   At baseline lives with his Michael [name]. NYHA III but independent. Says over last few days he had some fluid build up as he was drinking too much fluid. Went to [place] and found to have mild fluid overload with NSVT and ICD at [facility] Referred here.  Echo shows LVEF < 10% with moderate RV dysfunction. Personally reviewed  He also tested + for COVID   EP has evaluated and considering ICD gen change/revision. He carries h/o BIV-ICD but apparently both wires in RV and he has RBBB. His pacing strategy is unclear to me. Does not appear paced on ECG.   General:  Sitting up in bed No resp difficulty HEENT: normal Neck: supple. JVP to jaw Carotids 2+ bilat; no bruits. No lymphadenopathy or thryomegaly appreciated. Cor: PMI nondisplaced. Irreg tachy  Lungs: clear Abdomen: soft, nontender, nondistended. No hepatosplenomegaly. No bruits or masses. Good bowel sounds. Extremities: no cyanosis, clubbing, rash, edema Neuro: alert & orientedx3, cranial nerves grossly intact. moves all 4 extremities w/o difficulty. Affect pleasant  He has longstanding systolic HF due to severe biventricular failure with LVEF < 10%. He reports NYHA III symptoms but I  suspect may be worse. Currently mildly volume overloaded by his JVP but this may be as good as we can get him with his concomitant RV failure. I will give an extra dose of IV lasix tonight. If he is not responding could consider RHC to assess hemodynamics.  With his age, RV failure, CKD and compliance issues he is not a candidate for advanced therapies.   I will defer his device strategy to EP but I worry his mortality may be < 1 year and with chronic AF and RBBB not sure how much benefit he would get from Biv pacing unless AF is contributing to his CM and  AVN ablation with bivpacing will lead to improved LV performance. Probably worth a shot, if doable.   Arvilla Meres, MD  6:22 PM

## 2020-09-06 NOTE — Progress Notes (Signed)
Monitor tech notified that patient having frequent runs of VT.  Patient lying in bed, denies CP/SOB.  Noted to be in atrial fib with frequent runs of VT 5-15 beats with rate of 135.  BP 96/77,  PA Tillery notified and will come to see patient.

## 2020-09-06 NOTE — Progress Notes (Signed)
   09/06/20 1121  Assess: MEWS Score  Temp 98 F (36.7 C)  BP (!) 86/73  Pulse Rate 97  ECG Heart Rate (!) 109  Resp 20  Level of Consciousness Alert  SpO2 100 %  O2 Device Room Air  Assess: MEWS Score  MEWS Temp 0  MEWS Systolic 1  MEWS Pulse 1  MEWS RR 0  MEWS LOC 0  MEWS Score 2  MEWS Score Color Yellow  Assess: if the MEWS score is Yellow or Red  Were vital signs taken at a resting state? Yes  Focused Assessment No change from prior assessment  Early Detection of Sepsis Score *See Row Information* Low  MEWS guidelines implemented *See Row Information* Yes  Treat  MEWS Interventions Other (Comment)  Pain Scale 0-10  Pain Score 0  Take Vital Signs  Increase Vital Sign Frequency  Yellow: Q 2hr X 2 then Q 4hr X 2, if remains yellow, continue Q 4hrs  Escalate  MEWS: Escalate Yellow: discuss with charge nurse/RN and consider discussing with provider and RRT  Notify: Charge Nurse/RN  Name of Charge Nurse/RN Notified Kristen RN  Date Charge Nurse/RN Notified 09/06/20  Time Charge Nurse/RN Notified 1127  Notify: Provider  Provider Name/Title PA Tillery  Date Provider Notified 09/06/20  Time Provider Notified 1127  Notification Type Face-to-face  Notification Reason Other (Comment) (new Yellow Mews)  Provider response Other (Comment) (for battery replacement today)  Date of Provider Response 09/06/20  Time of Provider Response 1128  Document  Patient Outcome Other (Comment) (no change, stable at this time.)  Progress note created (see row info) Yes

## 2020-09-06 NOTE — Progress Notes (Signed)
  Echocardiogram 2D Echocardiogram has been performed.  Michael [name] 09/06/2020, 11:59 AM

## 2020-09-07 DIAGNOSIS — U071 COVID-19: Secondary | ICD-10-CM | POA: Diagnosis not present

## 2020-09-07 DIAGNOSIS — I5023 Acute on chronic systolic (congestive) heart failure: Secondary | ICD-10-CM | POA: Diagnosis not present

## 2020-09-07 DIAGNOSIS — N1832 Chronic kidney disease, stage 3b: Secondary | ICD-10-CM | POA: Diagnosis not present

## 2020-09-07 LAB — BASIC METABOLIC PANEL
Anion gap: 10 (ref 5–15)
BUN: 65 mg/dL — ABNORMAL HIGH (ref 8–23)
CO2: 19 mmol/L — ABNORMAL LOW (ref 22–32)
Calcium: 8.9 mg/dL (ref 8.9–10.3)
Chloride: 105 mmol/L (ref 98–111)
Creatinine, Ser: 2.02 mg/dL — ABNORMAL HIGH (ref 0.61–1.24)
GFR, Estimated: 34 mL/min — ABNORMAL LOW (ref >60)
Glucose, Bld: 139 mg/dL — ABNORMAL HIGH (ref 70–99)
Potassium: 4.3 mmol/L (ref 3.5–5.1)
Sodium: 134 mmol/L — ABNORMAL LOW (ref 135–145)

## 2020-09-07 LAB — HEPARIN LEVEL (UNFRACTIONATED): Heparin Unfractionated: 0.75 IU/mL — ABNORMAL HIGH (ref 0.30–0.70)

## 2020-09-07 LAB — GLUCOSE, CAPILLARY
Glucose-Capillary: 106 mg/dL — ABNORMAL HIGH (ref 70–99)
Glucose-Capillary: 154 mg/dL — ABNORMAL HIGH (ref 70–99)
Glucose-Capillary: 65 mg/dL — ABNORMAL LOW (ref 70–99)
Glucose-Capillary: 176 mg/dL — ABNORMAL HIGH (ref 70–99)

## 2020-09-07 LAB — APTT: aPTT: 126 seconds — ABNORMAL HIGH (ref 24–36)

## 2020-09-07 MED ORDER — HEPARIN (PORCINE) 25000 UT/250ML-% IV SOLN
850.0000 [IU]/h | INTRAVENOUS | Status: DC
  Administered 2020-09-07: 1000 [IU]/h via INTRAVENOUS
  Administered 2020-09-08: 850 [IU]/h via INTRAVENOUS
  Filled 2020-09-07 (×2): qty 250

## 2020-09-07 MED ORDER — FUROSEMIDE 40 MG PO TABS
40.0000 mg | ORAL_TABLET | Freq: Every day | ORAL | Status: DC
  Administered 2020-09-08: 40 mg via ORAL
  Filled 2020-09-07: qty 1

## 2020-09-07 NOTE — Progress Notes (Signed)
Advanced Heart Failure Rounding Note   Subjective:    Got 80 IV lasix last night. Weight down 2 pounds. Denies SOB. Orthopnea resolved. Says he feels fine.   Remains in AF 90-110. Several episodes of brief NSVT on monitor.   Creatinine stable overnight.  SBP in 90s   Objective:   Weight Range:  Vital Signs:   Temp:  [97.3 F (36.3 C)-98.2 F (36.8 C)] 97.7 F (36.5 C) (03/22 0835) Pulse Rate:  [52-118] 103 (03/22 0835) Resp:  [16-22] 17 (03/22 0835) BP: (87-114)/(64-100) 92/78 (03/22 0835) SpO2:  [87 %-100 %] 100 % (03/22 0835) Weight:  [73.8 kg] 73.8 kg (03/22 0358) Last BM Date: 09/07/20  Weight change: Filed Weights   09/05/20 1800 09/06/20 0438 09/07/20 0358  Weight: 74.1 kg 74.7 kg 73.8 kg    Intake/Output:   Intake/Output Summary (Last 24 hours) at 09/07/2020 1138 Last data filed at 09/07/2020 0900 Gross per 24 hour  Intake 682.68 ml  Output 1340 ml  Net -657.32 ml     Physical Exam: General:  Sitting up in bed. No resp difficulty  HEENT: normal Neck: supple. JVP flat . Carotids 2+ bilat; no bruits. No lymphadenopathy or thryomegaly appreciated. Cor: PMI nondisplaced. Irregular tachy . Lungs: clear Abdomen: soft, nontender, nondistended. No hepatosplenomegaly. No bruits or masses. Good bowel sounds. Extremities: no cyanosis, clubbing, rash, edema Neuro: alert & orientedx3, cranial nerves grossly intact. moves all 4 extremities w/o difficulty. Affect pleasant  Telemetry: AF 90-110. Several episodes of brief NSVT on monitor. Personally reviewed   Labs: Basic Metabolic Panel: Recent Labs  Lab 09/05/20 1703 09/06/20 0522 09/07/20 0244  NA 135 135 134*  K 4.3 5.0 4.3  CL 109 109 105  CO2 18* 17* 19*  GLUCOSE 96 105* 139*  BUN 52* 57* 65*  CREATININE 1.76* 2.01* 2.02*  CALCIUM 8.4* 8.8* 8.9  MG 2.1  --   --     Liver Function Tests: Recent Labs  Lab 09/05/20 1703  AST 26  ALT 23  ALKPHOS 78  BILITOT 1.5*  PROT 6.5  ALBUMIN 3.2*    No results for input(s): LIPASE, AMYLASE in the last 168 hours. No results for input(s): AMMONIA in the last 168 hours.  CBC: Recent Labs  Lab 09/05/20 1703  WBC 6.1  NEUTROABS 4.0  HGB 14.3  HCT 43.8  MCV 90.9  PLT 166    Cardiac Enzymes: No results for input(s): CKTOTAL, CKMB, CKMBINDEX, TROPONINI in the last 168 hours.  BNP: BNP (last 3 results) Recent Labs    09/05/20 1703  BNP 1,045.3*    ProBNP (last 3 results) No results for input(s): PROBNP in the last 8760 hours.    Other results:  Imaging: DG Chest 2 View  Result Date: 09/06/2020 CLINICAL DATA:  ICD in place. EXAM: CHEST - 2 VIEW COMPARISON:  Chest x-ray from yesterday FINDINGS: Unchanged left chest wall AICD. Stable cardiomegaly. Normal pulmonary vascularity. No focal consolidation, pleural effusion, or pneumothorax. No acute osseous abnormality. IMPRESSION: 1. No active disease. Electronically Signed   By: Obie Dredge M.D.   On: 09/06/2020 14:48   ECHOCARDIOGRAM LIMITED  Result Date: 09/06/2020    ECHOCARDIOGRAM LIMITED REPORT   Patient Name:   Michael [name] Date of Exam: 09/06/2020 Medical Rec #:  [id number]    Height:       62.0 in Accession #:    [id number]   Weight:       164.6 lb Date of Birth:  1946/03/20  BSA:          1.760 m Patient Age:    74 years     BP:           95/77 mmHg Patient Gender: M            HR:           107 bpm. Exam Location:  Inpatient Procedure: 2D Echo, Cardiac Doppler, Color Doppler and Saline Contrast Bubble            Study Indications:    CHF  History:        Patient has no prior history of Echocardiogram examinations.                 Cardiomyopathy, CAD, COPD, Arrythmias:Atrial Fibrillation; Risk                 Factors:Hypertension and Dyslipidemia. COVID+.  Sonographer:    Lavenia Atlas Referring Phys: [id number] SAMUEL G MCDOWELL  Sonographer Comments: COVID+ IMPRESSIONS  1. Global hypokinesis of LV with apical akinesis. . Left ventricular ejection fraction, by estimation,  is <20%. The left ventricle has severely decreased function. The left ventricle demonstrates global hypokinesis. The left ventricular internal cavity size was severely dilated. There is mild left ventricular hypertrophy. Left ventricular diastolic parameters are indeterminate.  2. Right ventricular systolic function is low normal. The right ventricular size is mildly enlarged. There is normal pulmonary artery systolic pressure.  3. Mild mitral valve regurgitation.  4. The aortic valve is tricuspid. Aortic valve regurgitation is not visualized. Mild to moderate aortic valve sclerosis/calcification is present, without any evidence of aortic stenosis. FINDINGS  Left Ventricle: Global hypokinesis of LV with apical akinesis. Left ventricular ejection fraction, by estimation, is <20%. The left ventricle has severely decreased function. The left ventricle demonstrates global hypokinesis. Definity contrast agent was given IV to delineate the left ventricular endocardial borders. The left ventricular internal cavity size was severely dilated. There is mild left ventricular hypertrophy. Left ventricular diastolic parameters are indeterminate. Right Ventricle: The right ventricular size is mildly enlarged. Right ventricular systolic function is low normal. There is normal pulmonary artery systolic pressure. The tricuspid regurgitant velocity is 2.84 m/s, and with an assumed right atrial pressure of 3 mmHg, the estimated right ventricular systolic pressure is 35.3 mmHg. Mitral Valve: Mild mitral annular calcification. Mild mitral valve regurgitation. Tricuspid Valve: The tricuspid valve is grossly normal. Tricuspid valve regurgitation is mild. Aortic Valve: The aortic valve is tricuspid. Aortic valve regurgitation is not visualized. Mild to moderate aortic valve sclerosis/calcification is present, without any evidence of aortic stenosis. Pulmonic Valve: The pulmonic valve was not well visualized. Aorta: The aortic root is  normal in size and structure. LEFT VENTRICLE PLAX 2D LVIDd:         6.30 cm  Diastology LVIDs:         5.70 cm  LV e' medial:    5.11 cm/s LV PW:         1.30 cm  LV E/e' medial:  18.4 LV IVS:        1.10 cm  LV e' lateral:   3.26 cm/s LVOT diam:     2.10 cm  LV E/e' lateral: 28.9 LVOT Area:     3.46 cm  RIGHT VENTRICLE RV S prime:     8.38 cm/s LEFT ATRIUM         Index LA diam:    3.90 cm 2.22 cm/m   AORTA Ao Root diam: 2.80  cm MITRAL VALVE               TRICUSPID VALVE MV Area (PHT): 7.44 cm    TR Peak grad:   32.3 mmHg MV Decel Time: 102 msec    TR Vmax:        284.00 cm/s MV E velocity: 94.10 cm/s MV A velocity: 25.20 cm/s  SHUNTS MV E/A ratio:  3.73        Systemic Diam: 2.10 cm Dietrich Pates MD Electronically signed by Dietrich Pates MD Signature Date/Time: 09/06/2020/4:29:00 PM    Final       Medications:     Scheduled Medications: . [START ON 09/08/2020] furosemide  40 mg Oral Daily  . gentamicin irrigation  80 mg Irrigation To Cath  . insulin aspart  0-5 Units Subcutaneous QHS  . insulin aspart  0-9 Units Subcutaneous TID WC  . metoprolol succinate  25 mg Oral Daily     Infusions: .  ceFAZolin (ANCEF) IV       PRN Medications:  acetaminophen, ondansetron (ZOFRAN) IV, sodium chloride flush   Assessment/Plan:   1. Acute on Chronic Systolic Heart Failure - Non-ischemic, longstanding NICM  - Echo at [facility] 07/2018 EF 15-20%. Cath w/ mild nonobstructive CAD  - Echo here shows EF <10% - Baseline NYHA III Admitted w/ NHYA Class IV symptoms. In setting of increased fluid intake - Volume status much improved. Can switch back to po lasix.  - Lisinopril and Spiro on hold w/ elevated SCr and low BP - Continue Toprol - With his age, RV failure, CKD and compliance issues he is not ideal candidate for advanced therapies.  - Consider SGLT2i after device change - Unclear if AF contributing to his cardiomyopathy. If candidate, I would favor AVN ablation with bivpacing to see if this will lead to  improved LV performance.  - I don't think RHC will change management at this point   2. ICD past ERI  - device nonfunctioning. Has recent VT/NSVT - EP following and planning gen change +/- LV lead intervention/revision  intervention/revision   3. COVID 19 Infection  - incidentally tested + on admit - asymptomatic - he is vaccinated and Boosted   4. NSVT/VT - Nonfunctioning ICD, past ERI  - plan gen change prior to d/c  - Keep K >4.0. Mg > 2.0 - Management per EP  5. Chronic Afib - rates remain mildly elevated on ? blocker - On chronic a/c w/ Xarelto - AC strategy while in-house per EP. D/w PharmD as well   6. CKD, Stage IIIb  - per Care Everywhere, SCr in 2020 was ~2.3  - 1.8 on admit>>>2.0 today  - Monitor w/ diuresis - Continue to hold lisinopril and spiro for now - follow daily BMP  - consider SGLT2i soon     Length of Stay: 2   Arvilla Meres MD 09/07/2020, 11:38 AM  Advanced Heart Failure Team Pager [phone] (M-F; 7a - 4p)  Please contact [facility] for night-coverage after hours (4p -7a ) and weekends on [email or web address]

## 2020-09-07 NOTE — Progress Notes (Signed)
ANTICOAGULATION CONSULT NOTE  Pharmacy Consult for heparin Indication: atrial fibrillation  Allergies  Allergen Reactions  . Digoxin Shortness Of Breath    Patient Measurements: Height: 5\' 2"  (157.5 cm) Weight: 73.8 kg (162 lb 9.6 oz) IBW/kg (Calculated) : 54.6 Heparin Dosing Weight: 69.9 kg   Vital Signs: Temp: 97.7 F (36.5 C) (03/22 0835) Temp Source: Oral (03/22 0835) BP: 92/78 (03/22 0835) Pulse Rate: 103 (03/22 0835)  Labs: Recent Labs    09/05/20 1703 09/06/20 0522 09/07/20 0244  HGB 14.3  --   --   HCT 43.8  --   --   PLT 166  --   --   LABPROT 27.5*  --   --   INR 2.7*  --   --   CREATININE 1.76* 2.01* 2.02*    Estimated Creatinine Clearance: 27.8 mL/min (A) (by C-G formula based on SCr of 2.02 mg/dL (H)).   Medical History: Past Medical History:  Diagnosis Date  . Arthritis   . Cataract   . Chronic atrial fibrillation (HCC)   . Chronic kidney disease, stage 3 unspecified (HCC)   . Chronic pain   . Chronic systolic heart failure (HCC)    LVEF 20 to 25%  . COPD (chronic obstructive pulmonary disease) (HCC)   . Current use of long term anticoagulation   . Dilated cardiomyopathy (HCC)    Mild CAD at cardiac catheterization 2020  . Hyperlipidemia   . Hypertension   . Presence of automatic implantable cardioverter-defibrillator    Medtronic biventricular ICD  . Type 2 diabetes mellitus (HCC)   . Ventricular tachycardia (HCC)     Medications:  Scheduled:  . [START ON 09/08/2020] furosemide  40 mg Oral Daily  . gentamicin irrigation  80 mg Irrigation To Cath  . insulin aspart  0-5 Units Subcutaneous QHS  . insulin aspart  0-9 Units Subcutaneous TID WC  . metoprolol succinate  25 mg Oral Daily    Assessment: 74 yom presenting with orthopnea/PND from OSH. ICD was interrogated and discovered to be past ERI and not functioning>> plan for ICD generator change this week  - on Xarelto 15 mg PTA for hx Afib (LD 3/20@1811 ).   Given recent DOAC use,  will monitor both aPTT and heparin level until correlate. Given plan for generator change on Thursday - plan to stop heparin infusion on 3/24 at midnight. CBC stable - no s/sx of bleeding.  Goal of Therapy:  Heparin level 0.3-0.7 units/ml aPTT 66-102 seconds Monitor platelets by anticoagulation protocol: Yes   Plan:  Start heparin infusion at 1000 units/hr  Order 8 hr HL/aPTT after start Monitor daily HL/aPTT until correlate, CBC, and for s/sx of bleeding  Heparin off on 3/24@midnight  prior to generator change   4/24, PharmD, BCCCP Clinical Pharmacist  Phone: [phone] 09/07/2020 11:45 AM  Please check AMION for all [facility] Pharmacy phone numbers After 10:00 PM, call Main Pharmacy [phone]

## 2020-09-07 NOTE — Progress Notes (Addendum)
Electrophysiology Rounding Note  Patient Name: Michael [name] Date of Encounter: 09/07/2020  Primary Cardiologist: No primary care provider on file. Electrophysiologist: Lanier Prude, MD   Subjective   No acute events overnight. No new complaints.   Inpatient Medications    Scheduled Meds: . furosemide  40 mg Intravenous Daily  . gentamicin irrigation  80 mg Irrigation To Cath  . insulin aspart  0-5 Units Subcutaneous QHS  . insulin aspart  0-9 Units Subcutaneous TID WC  . metoprolol succinate  25 mg Oral Daily   Continuous Infusions: .  ceFAZolin (ANCEF) IV     PRN Meds: acetaminophen, ondansetron (ZOFRAN) IV, sodium chloride flush   Vital Signs    Vitals:   09/06/20 2248 09/06/20 2344 09/07/20 0358 09/07/20 0401  BP: (!) 87/75 (!) 88/73  91/67  Pulse: (!) 54 93  (!) 52  Resp: 20   20  Temp: (!) 97.3 F (36.3 C)   (!) 97.4 F (36.3 C)  TempSrc: Rectal   Oral  SpO2: 97% (!) 87%  92%  Weight:   73.8 kg   Height:   5\' 2"  (1.575 m)     Intake/Output Summary (Last 24 hours) at 09/07/2020 0725 Last data filed at 09/07/2020 0403 Gross per 24 hour  Intake 1162.68 ml  Output 1390 ml  Net -227.32 ml   Filed Weights   09/05/20 1800 09/06/20 0438 09/07/20 0358  Weight: 74.1 kg 74.7 kg 73.8 kg    Physical Exam    GEN- The patient is well appearing, alert and oriented x 3 today.   Head- normocephalic, atraumatic Eyes-  Sclera clear, conjunctiva pink Ears- hearing intact Oropharynx- clear Neck- supple Lungs- Clear to ausculation bilaterally, normal work of breathing Heart- Irregularly irregular rate and rhythm, no murmurs, rubs or gallops GI- soft, NT, ND, + BS Extremities- no clubbing or cyanosis. No edema Skin- no rash or lesion Psych- euthymic mood, full affect Neuro- strength and sensation are intact  Labs    CBC Recent Labs    09/05/20 1703  WBC 6.1  NEUTROABS 4.0  HGB 14.3  HCT 43.8  MCV 90.9  PLT 166   Basic Metabolic Panel Recent  Labs    09/05/20 1703 09/06/20 0522 09/07/20 0244  NA 135 135 134*  K 4.3 5.0 4.3  CL 109 109 105  CO2 18* 17* 19*  GLUCOSE 96 105* 139*  BUN 52* 57* 65*  CREATININE 1.76* 2.01* 2.02*  CALCIUM 8.4* 8.8* 8.9  MG 2.1  --   --    Liver Function Tests Recent Labs    09/05/20 1703  AST 26  ALT 23  ALKPHOS 78  BILITOT 1.5*  PROT 6.5  ALBUMIN 3.2*   No results for input(s): LIPASE, AMYLASE in the last 72 hours. Cardiac Enzymes No results for input(s): CKTOTAL, CKMB, CKMBINDEX, TROPONINI in the last 72 hours.   Telemetry    AF 100-110s. "VF" episodes are noise. (personally reviewed)   Radiology    DG Chest 2 View  Result Date: 09/06/2020 CLINICAL DATA:  ICD in place. EXAM: CHEST - 2 VIEW COMPARISON:  Chest x-ray from yesterday FINDINGS: Unchanged left chest wall AICD. Stable cardiomegaly. Normal pulmonary vascularity. No focal consolidation, pleural effusion, or pneumothorax. No acute osseous abnormality. IMPRESSION: 1. No active disease. Electronically Signed   By: 09/08/2020 M.D.   On: 09/06/2020 14:48   ECHOCARDIOGRAM LIMITED  Result Date: 09/06/2020    ECHOCARDIOGRAM LIMITED REPORT   Patient Name:   Michael  [name] Date of Exam: 09/06/2020 Medical Rec #:  [id number]    Height:       62.0 in Accession #:    [id number]   Weight:       164.6 lb Date of Birth:  Sep 20, 1945     BSA:          1.760 m Patient Age:    74 years     BP:           95/77 mmHg Patient Gender: M            HR:           107 bpm. Exam Location:  Inpatient Procedure: 2D Echo, Cardiac Doppler, Color Doppler and Saline Contrast Bubble            Study Indications:    CHF  History:        Patient has no prior history of Echocardiogram examinations.                 Cardiomyopathy, CAD, COPD, Arrythmias:Atrial Fibrillation; Risk                 Factors:Hypertension and Dyslipidemia. COVID+.  Sonographer:    Lavenia Atlas Referring Phys: [id number] SAMUEL G MCDOWELL  Sonographer Comments: COVID+ IMPRESSIONS  1. Global  hypokinesis of LV with apical akinesis. . Left ventricular ejection fraction, by estimation, is <20%. The left ventricle has severely decreased function. The left ventricle demonstrates global hypokinesis. The left ventricular internal cavity size was severely dilated. There is mild left ventricular hypertrophy. Left ventricular diastolic parameters are indeterminate.  2. Right ventricular systolic function is low normal. The right ventricular size is mildly enlarged. There is normal pulmonary artery systolic pressure.  3. Mild mitral valve regurgitation.  4. The aortic valve is tricuspid. Aortic valve regurgitation is not visualized. Mild to moderate aortic valve sclerosis/calcification is present, without any evidence of aortic stenosis. FINDINGS  Left Ventricle: Global hypokinesis of LV with apical akinesis. Left ventricular ejection fraction, by estimation, is <20%. The left ventricle has severely decreased function. The left ventricle demonstrates global hypokinesis. Definity contrast agent was given IV to delineate the left ventricular endocardial borders. The left ventricular internal cavity size was severely dilated. There is mild left ventricular hypertrophy. Left ventricular diastolic parameters are indeterminate. Right Ventricle: The right ventricular size is mildly enlarged. Right ventricular systolic function is low normal. There is normal pulmonary artery systolic pressure. The tricuspid regurgitant velocity is 2.84 m/s, and with an assumed right atrial pressure of 3 mmHg, the estimated right ventricular systolic pressure is 35.3 mmHg. Mitral Valve: Mild mitral annular calcification. Mild mitral valve regurgitation. Tricuspid Valve: The tricuspid valve is grossly normal. Tricuspid valve regurgitation is mild. Aortic Valve: The aortic valve is tricuspid. Aortic valve regurgitation is not visualized. Mild to moderate aortic valve sclerosis/calcification is present, without any evidence of aortic  stenosis. Pulmonic Valve: The pulmonic valve was not well visualized. Aorta: The aortic root is normal in size and structure. LEFT VENTRICLE PLAX 2D LVIDd:         6.30 cm  Diastology LVIDs:         5.70 cm  LV e' medial:    5.11 cm/s LV PW:         1.30 cm  LV E/e' medial:  18.4 LV IVS:        1.10 cm  LV e' lateral:   3.26 cm/s LVOT diam:     2.10 cm  LV E/e' lateral:  28.9 LVOT Area:     3.46 cm  RIGHT VENTRICLE RV S prime:     8.38 cm/s LEFT ATRIUM         Index LA diam:    3.90 cm 2.22 cm/m   AORTA Ao Root diam: 2.80 cm MITRAL VALVE               TRICUSPID VALVE MV Area (PHT): 7.44 cm    TR Peak grad:   32.3 mmHg MV Decel Time: 102 msec    TR Vmax:        284.00 cm/s MV E velocity: 94.10 cm/s MV A velocity: 25.20 cm/s  SHUNTS MV E/A ratio:  3.73        Systemic Diam: 2.10 cm Dietrich Pates MD Electronically signed by Dietrich Pates MD Signature Date/Time: 09/06/2020/4:29:00 PM    Final     Patient Profile     Michael [name] is a 75 y.o. male with a history of dilated NICM, LVEF 20-25% s/p MDT BiV ICD, HTN, HLD, mild CAD at cath 2020, COPD, CKD IIIb, and permanent AF who is being seen today for the evaluation of AF RVR, NSVT, and device at [facility] at the request of Dr. Diona Browner.  Assessment & Plan    1. Acute on chronic systolic CHF Reports show last LVEF 20-25% in 2020 Pt on Toprol and lasix at home NT pro BNP 6570 on arrival.  Device interrogation confirms device at [facility]. Ace/ARB and spiro on hold with AKI / unclear Cr baseline. Per [facility] notes (03/2019) pt "Dual site RV pacing with the LV port programmed Subtreshold "   He was then lost to / or non compliant with EP follow up. HF team following.   2. H/o VT Reports shocks as recently as 2020 and previously on amiodarone.  Cath at that time with only mild CAD Patient will require ICD generator replacement. Prior monitored VT episodes appear to be AF with RVR. We have discussed the ICD generator replacement in detail with the patient including the risks  and recovery and he wishes to proceed.    3. CKD IIIb Stable this am at 2.02  4. Permanent AF Last dose Xarelto 09/05/20.   CHA2DS2VASC of at least 6. Xarelto dose adjusted for CKD IIIB.   Xarelto on hold pending procedures. Will cover with heparin per discussion with Dr. Lalla Brothers.   4. COVID+ Incidental. Asymptomatic and stable. Pt is vaccinated and boosted.   Will plan for Generator change on Thursday. IF subclavian is patent will attempt LV / CS lead placement, if able to do that, will consider AV nodal ablation, either at that time or as outpatient pending pt course.  If CS lead unable to be placed, would proceed with gen change alone as to not promote further increases in RV pacing and may need to consider palliative care involvement for goals of care.   For questions or updates, please contact [facility] Please consult [email or web address] for contact info under Cardiology/STEMI.  Signed, Graciella Freer, PA-C  09/07/2020, 7:25 AM

## 2020-09-07 NOTE — Progress Notes (Signed)
ANTICOAGULATION CONSULT NOTE  Pharmacy Consult for heparin Indication: atrial fibrillation  Allergies  Allergen Reactions  . Digoxin Shortness Of Breath    Patient Measurements: Height: 5\' 2"  (157.5 cm) Weight: 73.8 kg (162 lb 9.6 oz) IBW/kg (Calculated) : 54.6 Heparin Dosing Weight: 69.9 kg   Vital Signs: Temp: 97.7 F (36.5 C) (03/22 2044) Temp Source: Oral (03/22 2044) BP: 107/65 (03/22 2044) Pulse Rate: 93 (03/22 2044)  Labs: Recent Labs    09/05/20 1703 09/06/20 0522 09/07/20 0244 09/07/20 1939  HGB 14.3  --   --   --   HCT 43.8  --   --   --   PLT 166  --   --   --   APTT  --   --   --  126*  LABPROT 27.5*  --   --   --   INR 2.7*  --   --   --   HEPARINUNFRC  --   --   --  0.75*  CREATININE 1.76* 2.01* 2.02*  --     Estimated Creatinine Clearance: 27.8 mL/min (A) (by C-G formula based on SCr of 2.02 mg/dL (H)).   Medical History: Past Medical History:  Diagnosis Date  . Arthritis   . Cataract   . Chronic atrial fibrillation (HCC)   . Chronic kidney disease, stage 3 unspecified (HCC)   . Chronic pain   . Chronic systolic heart failure (HCC)    LVEF 20 to 25%  . COPD (chronic obstructive pulmonary disease) (HCC)   . Current use of long term anticoagulation   . Dilated cardiomyopathy (HCC)    Mild CAD at cardiac catheterization 2020  . Hyperlipidemia   . Hypertension   . Presence of automatic implantable cardioverter-defibrillator    Medtronic biventricular ICD  . Type 2 diabetes mellitus (HCC)   . Ventricular tachycardia (HCC)     Medications:  Scheduled:  . [START ON 09/08/2020] furosemide  40 mg Oral Daily  . insulin aspart  0-5 Units Subcutaneous QHS  . insulin aspart  0-9 Units Subcutaneous TID WC  . metoprolol succinate  25 mg Oral Daily    Assessment: 74 yom presenting with orthopnea/PND from OSH. ICD was interrogated and discovered to be past ERI and not functioning>> plan for ICD generator change this week  - on Xarelto 15 mg PTA  for hx Afib (LD 3/20@1811 ).   Given recent DOAC use, will monitor both aPTT and heparin level until correlate. Given plan for generator change on Thursday - plan to stop heparin infusion on 3/24 at midnight. CBC stable - no s/sx of bleeding.  PM f/u > aPTT above goal, and heparin level elevated as well.  No overt bleeding or complications noted.  Goal of Therapy:  Heparin level 0.3-0.7 units/ml aPTT 66-102 seconds Monitor platelets by anticoagulation protocol: Yes   Plan:  Decrease IV heparin to 850 units/hr.   Repeat aPTT/heparin level in 8 hrs. Monitor daily HL/aPTT until correlate, CBC, and for s/sx of bleeding  Heparin off on 3/24@midnight  prior to generator change    4/24, Reece Leader, [facility] Clinical Pharmacist  09/07/2020 8:53 PM   [facility] pharmacy phone numbers are listed on [email or web address]

## 2020-09-08 DIAGNOSIS — U071 COVID-19: Secondary | ICD-10-CM | POA: Diagnosis not present

## 2020-09-08 DIAGNOSIS — N1832 Chronic kidney disease, stage 3b: Secondary | ICD-10-CM | POA: Diagnosis not present

## 2020-09-08 DIAGNOSIS — I4821 Permanent atrial fibrillation: Secondary | ICD-10-CM | POA: Diagnosis not present

## 2020-09-08 DIAGNOSIS — I5023 Acute on chronic systolic (congestive) heart failure: Secondary | ICD-10-CM | POA: Diagnosis not present

## 2020-09-08 DIAGNOSIS — I472 Ventricular tachycardia: Secondary | ICD-10-CM | POA: Diagnosis not present

## 2020-09-08 LAB — HEPARIN LEVEL (UNFRACTIONATED)
Heparin Unfractionated: 0.6 IU/mL (ref 0.30–0.70)
Heparin Unfractionated: 0.36 IU/mL (ref 0.30–0.70)

## 2020-09-08 LAB — SURGICAL PCR SCREEN
MRSA, PCR: NEGATIVE
Staphylococcus aureus: NEGATIVE

## 2020-09-08 LAB — BASIC METABOLIC PANEL
Anion gap: 10 (ref 5–15)
BUN: 75 mg/dL — ABNORMAL HIGH (ref 8–23)
CO2: 20 mmol/L — ABNORMAL LOW (ref 22–32)
Calcium: 8.7 mg/dL — ABNORMAL LOW (ref 8.9–10.3)
Chloride: 101 mmol/L (ref 98–111)
Creatinine, Ser: 2.26 mg/dL — ABNORMAL HIGH (ref 0.61–1.24)
GFR, Estimated: 30 mL/min — ABNORMAL LOW (ref >60)
Glucose, Bld: 104 mg/dL — ABNORMAL HIGH (ref 70–99)
Potassium: 4.1 mmol/L (ref 3.5–5.1)
Sodium: 131 mmol/L — ABNORMAL LOW (ref 135–145)

## 2020-09-08 LAB — APTT
aPTT: 107 seconds — ABNORMAL HIGH (ref 24–36)
aPTT: 94 seconds — ABNORMAL HIGH (ref 24–36)

## 2020-09-08 LAB — GLUCOSE, CAPILLARY
Glucose-Capillary: 97 mg/dL (ref 70–99)
Glucose-Capillary: 168 mg/dL — ABNORMAL HIGH (ref 70–99)
Glucose-Capillary: 105 mg/dL — ABNORMAL HIGH (ref 70–99)
Glucose-Capillary: 129 mg/dL — ABNORMAL HIGH (ref 70–99)

## 2020-09-08 MED ORDER — MUPIROCIN 2 % EX OINT
1.0000 "application " | TOPICAL_OINTMENT | Freq: Two times a day (BID) | CUTANEOUS | Status: DC
  Administered 2020-09-08: 1 via NASAL
  Filled 2020-09-08: qty 22

## 2020-09-08 NOTE — Progress Notes (Signed)
ANTICOAGULATION CONSULT NOTE - Follow Up Consult  Pharmacy Consult for heparin Indication: atrial fibrillation  Labs: Recent Labs    09/05/20 1703 09/06/20 0522 09/07/20 0244 09/07/20 1939 09/08/20 0244  HGB 14.3  --   --   --   --   HCT 43.8  --   --   --   --   PLT 166  --   --   --   --   APTT  --   --   --  126* 107*  LABPROT 27.5*  --   --   --   --   INR 2.7*  --   --   --   --   HEPARINUNFRC  --   --   --  0.75* 0.60  CREATININE 1.76* 2.01* 2.02*  --  2.26*    Assessment/Plan:  74yo male therapeutic on heparin after rate change. Will continue gtt at current rate of 850 units/hr and confirm stable with additional level.   Vernard Gambles, PharmD, BCPS  09/08/2020,4:23 AM

## 2020-09-08 NOTE — Progress Notes (Signed)
Electrophysiology Rounding Note  Patient Name: Michael [name] Date of Encounter: 09/08/2020  Primary Cardiologist: No primary care provider on file. Electrophysiologist: Lanier Prude, MD   Subjective   The patient is doing well currently with no new concerns. The patient denies chest pain, shortness of breath, or any new concerns.  Inpatient Medications    Scheduled Meds: . furosemide  40 mg Oral Daily  . insulin aspart  0-5 Units Subcutaneous QHS  . insulin aspart  0-9 Units Subcutaneous TID WC  . metoprolol succinate  25 mg Oral Daily   Continuous Infusions: . heparin 850 Units/hr (09/07/20 2113)   PRN Meds: acetaminophen, ondansetron (ZOFRAN) IV, sodium chloride flush   Vital Signs    Vitals:   09/07/20 1217 09/07/20 1700 09/07/20 2044 09/08/20 0546  BP: (!) 86/60 91/77 107/65 93/66  Pulse: (!) 101 94 93 85  Resp: 18 18 19 17   Temp: 98 F (36.7 C) 97.7 F (36.5 C) 97.7 F (36.5 C) 97.7 F (36.5 C)  TempSrc: Oral Oral Oral Oral  SpO2: 100% 93% 96% 98%  Weight:    73.8 kg  Height:        Intake/Output Summary (Last 24 hours) at 09/08/2020 0748 Last data filed at 09/08/2020 0649 Gross per 24 hour  Intake 13.89 ml  Output 500 ml  Net -486.11 ml   Filed Weights   09/06/20 0438 09/07/20 0358 09/08/20 0546  Weight: 74.7 kg 73.8 kg 73.8 kg    Physical Exam    GEN- The patient is well appearing, alert and oriented x 3 today.   Head- normocephalic, atraumatic Eyes-  Sclera clear, conjunctiva pink Ears- hearing intact Oropharynx- clear Neck- supple Lungs- Clear to ausculation bilaterally, normal work of breathing Heart- Irregularly irregular rate and rhythm, no murmurs, rubs or gallops GI- soft, NT, ND, + BS Extremities- no clubbing or cyanosis. No edema Skin- no rash or lesion Psych- euthymic mood, full affect Neuro- strength and sensation are intact  Labs    CBC Recent Labs    09/05/20 1703  WBC 6.1  NEUTROABS 4.0  HGB 14.3  HCT 43.8   MCV 90.9  PLT 166   Basic Metabolic Panel Recent Labs    09/07/20 1703 09/06/20 0522 09/07/20 0244 09/08/20 0244  NA 135   < > 134* 131*  K 4.3   < > 4.3 4.1  CL 109   < > 105 101  CO2 18*   < > 19* 20*  GLUCOSE 96   < > 139* 104*  BUN 52*   < > 65* 75*  CREATININE 1.76*   < > 2.02* 2.26*  CALCIUM 8.4*   < > 8.9 8.7*  MG 2.1  --   --   --    < > = values in this interval not displayed.   Liver Function Tests Recent Labs    09/05/20 1703  AST 26  ALT 23  ALKPHOS 78  BILITOT 1.5*  PROT 6.5  ALBUMIN 3.2*   No results for input(s): LIPASE, AMYLASE in the last 72 hours. Cardiac Enzymes No results for input(s): CKTOTAL, CKMB, CKMBINDEX, TROPONINI in the last 72 hours.   Telemetry    AF 90-100s (personally reviewed)  Radiology    DG Chest 2 View  Result Date: 09/06/2020 CLINICAL DATA:  ICD in place. EXAM: CHEST - 2 VIEW COMPARISON:  Chest x-ray from yesterday FINDINGS: Unchanged left chest wall AICD. Stable cardiomegaly. Normal pulmonary vascularity. No focal consolidation, pleural effusion, or pneumothorax.  No acute osseous abnormality. IMPRESSION: 1. No active disease. Electronically Signed   By: Obie Dredge M.D.   On: 09/06/2020 14:48   ECHOCARDIOGRAM LIMITED  Result Date: 09/06/2020    ECHOCARDIOGRAM LIMITED REPORT   Patient Name:   Michael [name] Date of Exam: 09/06/2020 Medical Rec #:  [id number]    Height:       62.0 in Accession #:    [id number]   Weight:       164.6 lb Date of Birth:  1945-12-25     BSA:          1.760 m Patient Age:    74 years     BP:           95/77 mmHg Patient Gender: M            HR:           107 bpm. Exam Location:  Inpatient Procedure: 2D Echo, Cardiac Doppler, Color Doppler and Saline Contrast Bubble            Study Indications:    CHF  History:        Patient has no prior history of Echocardiogram examinations.                 Cardiomyopathy, CAD, COPD, Arrythmias:Atrial Fibrillation; Risk                 Factors:Hypertension and  Dyslipidemia. COVID+.  Sonographer:    Lavenia Atlas Referring Phys: [id number] SAMUEL G MCDOWELL  Sonographer Comments: COVID+ IMPRESSIONS  1. Global hypokinesis of LV with apical akinesis. . Left ventricular ejection fraction, by estimation, is <20%. The left ventricle has severely decreased function. The left ventricle demonstrates global hypokinesis. The left ventricular internal cavity size was severely dilated. There is mild left ventricular hypertrophy. Left ventricular diastolic parameters are indeterminate.  2. Right ventricular systolic function is low normal. The right ventricular size is mildly enlarged. There is normal pulmonary artery systolic pressure.  3. Mild mitral valve regurgitation.  4. The aortic valve is tricuspid. Aortic valve regurgitation is not visualized. Mild to moderate aortic valve sclerosis/calcification is present, without any evidence of aortic stenosis. FINDINGS  Left Ventricle: Global hypokinesis of LV with apical akinesis. Left ventricular ejection fraction, by estimation, is <20%. The left ventricle has severely decreased function. The left ventricle demonstrates global hypokinesis. Definity contrast agent was given IV to delineate the left ventricular endocardial borders. The left ventricular internal cavity size was severely dilated. There is mild left ventricular hypertrophy. Left ventricular diastolic parameters are indeterminate. Right Ventricle: The right ventricular size is mildly enlarged. Right ventricular systolic function is low normal. There is normal pulmonary artery systolic pressure. The tricuspid regurgitant velocity is 2.84 m/s, and with an assumed right atrial pressure of 3 mmHg, the estimated right ventricular systolic pressure is 35.3 mmHg. Mitral Valve: Mild mitral annular calcification. Mild mitral valve regurgitation. Tricuspid Valve: The tricuspid valve is grossly normal. Tricuspid valve regurgitation is mild. Aortic Valve: The aortic valve is tricuspid.  Aortic valve regurgitation is not visualized. Mild to moderate aortic valve sclerosis/calcification is present, without any evidence of aortic stenosis. Pulmonic Valve: The pulmonic valve was not well visualized. Aorta: The aortic root is normal in size and structure. LEFT VENTRICLE PLAX 2D LVIDd:         6.30 cm  Diastology LVIDs:         5.70 cm  LV e' medial:    5.11 cm/s LV PW:  1.30 cm  LV E/e' medial:  18.4 LV IVS:        1.10 cm  LV e' lateral:   3.26 cm/s LVOT diam:     2.10 cm  LV E/e' lateral: 28.9 LVOT Area:     3.46 cm  RIGHT VENTRICLE RV S prime:     8.38 cm/s LEFT ATRIUM         Index LA diam:    3.90 cm 2.22 cm/m   AORTA Ao Root diam: 2.80 cm MITRAL VALVE               TRICUSPID VALVE MV Area (PHT): 7.44 cm    TR Peak grad:   32.3 mmHg MV Decel Time: 102 msec    TR Vmax:        284.00 cm/s MV E velocity: 94.10 cm/s MV A velocity: 25.20 cm/s  SHUNTS MV E/A ratio:  3.73        Systemic Diam: 2.10 cm Dietrich Pates MD Electronically signed by Dietrich Pates MD Signature Date/Time: 09/06/2020/4:29:00 PM    Final     Patient Profile     Michael [name] a 75 y.o.malewith a history of dilated NICM, LVEF 20-25% s/p MDT BiV ICD, HTN, HLD, mild CAD at cath 2020, COPD, CKD IIIb, and permanent AFwho is being seen today for the evaluation ofAF RVR, NSVT, and device at [facility] the request of Dr. Diona Browner.  Assessment & Plan    1.Acute on chronic systolic CHF Reports show last LVEF 20-25% in 2020 Pt on Toprol and lasix at home Now on po lasix.  Ace/ARB and spiro on hold with AKI / unclear Cr baseline. Per [facility] notes (03/2019) pt "Dual site RV pacing with the LV port programmed Subtreshold" He was then lost to / or non compliant with EP follow up. Appreciate HF team care  2. H/o VT Reports shocks as recently as 2020 and previously on amiodarone.  Cath at that time with only mild CAD Patient will require ICD generator replacement. Prior monitored VT episodes appear to be AF with RVR. We  have discussed the ICD generator replacement in detail with the patient including the risks and recovery and he wishes to proceed. Plan for tomorrow afternoon.   3. CKD III Cr 2.2 this am. Baseline appears 2.0-2.2  4. Permanent AF Last dose Xarelto 09/05/20. CHA2DS2VASC ofat least 6. Xarelto dose adjusted for CKD IIIB. Xarelto on hold pending procedures. Covering with heparin which will stop at midnight. He will remain off OAC 5 days s/p gen change.   4. COVID+ Incidental. Asymptomatic and stable.  Pt is vaccinated and boosted.  Plan for Gen change and left subclavian venogram +/- CS lead addition +/- AVJ ablation tomorrow pending pt access and course.   For questions or updates, please contact [facility] Please consult [email or web address] for contact info under Cardiology/STEMI.  Signed, Graciella Freer, PA-C  09/08/2020, 7:48 AM

## 2020-09-08 NOTE — Progress Notes (Signed)
ANTICOAGULATION CONSULT NOTE  Pharmacy Consult for heparin Indication: atrial fibrillation  Allergies  Allergen Reactions  . Digoxin Shortness Of Breath    Patient Measurements: Height: 5\' 2"  (157.5 cm) Weight: 73.8 kg (162 lb 11.2 oz) IBW/kg (Calculated) : 54.6 Heparin Dosing Weight: 69.9 kg   Vital Signs: Temp: 97.6 F (36.4 C) (03/23 0803) Temp Source: Oral (03/23 0803) BP: 90/65 (03/23 0803) Pulse Rate: 97 (03/23 0803)  Labs: Recent Labs    09/05/20 1703 09/06/20 0522 09/07/20 0244 09/07/20 1939 09/08/20 0244 09/08/20 1016  HGB 14.3  --   --   --   --   --   HCT 43.8  --   --   --   --   --   PLT 166  --   --   --   --   --   APTT  --   --   --  126* 107*  --   LABPROT 27.5*  --   --   --   --   --   INR 2.7*  --   --   --   --   --   HEPARINUNFRC  --   --   --  0.75* 0.60 0.36  CREATININE 1.76* 2.01* 2.02*  --  2.26*  --     Estimated Creatinine Clearance: 24.9 mL/min (A) (by C-G formula based on SCr of 2.26 mg/dL (H)).   Medical History: Past Medical History:  Diagnosis Date  . Arthritis   . Cataract   . Chronic atrial fibrillation (HCC)   . Chronic kidney disease, stage 3 unspecified (HCC)   . Chronic pain   . Chronic systolic heart failure (HCC)    LVEF 20 to 25%  . COPD (chronic obstructive pulmonary disease) (HCC)   . Current use of long term anticoagulation   . Dilated cardiomyopathy (HCC)    Mild CAD at cardiac catheterization 2020  . Hyperlipidemia   . Hypertension   . Presence of automatic implantable cardioverter-defibrillator    [employer] biventricular ICD  . Type 2 diabetes mellitus (HCC)   . Ventricular tachycardia (HCC)     Medications:  Scheduled:  . furosemide  40 mg Oral Daily  . insulin aspart  0-5 Units Subcutaneous QHS  . insulin aspart  0-9 Units Subcutaneous TID WC  . metoprolol succinate  25 mg Oral Daily    Assessment: 74 yom presenting with orthopnea/PND from OSH. ICD was interrogated and discovered to be past  ERI and not functioning>> plan for ICD generator change this week  - on Xarelto 15 mg PTA for hx Afib (LD 3/20@1811 ).   Given recent DOAC use, will monitor both aPTT and heparin level until correlate. Given plan for generator change on Thursday - plan to stop heparin infusion on 3/24 at midnight. CBC stable - no s/sx of bleeding.  Heparin level came back therapeutic at 0.36 - correlating with aPTT which was therapeutic at 94, on heparin infusion at 850 units/hr. No s/sx of bleeding or infusion issues.   Goal of Therapy:  Heparin level 0.3-0.7 units/ml Monitor platelets by anticoagulation protocol: Yes   Plan:  Continue IV heparin at 850 units/hr.   Monitor daily HL/aPTT until correlate, CBC, and for s/sx of bleeding  Heparin off on 3/24@midnight  prior to generator change    4/24, PharmD, BCCCP Clinical Pharmacist  Phone: [phone] 09/08/2020 11:54 AM  Please check AMION for all [facility] phone numbers After 10:00 PM, call Main Pharmacy [phone]

## 2020-09-08 NOTE — Progress Notes (Addendum)
Advanced Heart Failure Rounding Note   Subjective:    Wt stable after change to PO diuretics but w/ bump in SCr/BUN SCr 2.01>>2.02>>2.26 BUN 57>>65>>75  Lasix given this am. Had hypotension yesterday.   In Afib HR 80s.   Objective:   Weight Range:  Vital Signs:   Temp:  [97.6 F (36.4 C)-97.7 F (36.5 C)] 97.6 F (36.4 C) (03/23 0803) Pulse Rate:  [85-97] 97 (03/23 0803) Resp:  [17-19] 18 (03/23 0803) BP: (90-107)/(65-77) 90/65 (03/23 0803) SpO2:  [93 %-98 %] 94 % (03/23 0803) Weight:  [73.8 kg] 73.8 kg (03/23 0546) Last BM Date: 09/07/20  Weight change: Filed Weights   09/06/20 0438 09/07/20 0358 09/08/20 0546  Weight: 74.7 kg 73.8 kg 73.8 kg    Intake/Output:   Intake/Output Summary (Last 24 hours) at 09/08/2020 1222 Last data filed at 09/08/2020 0649 Gross per 24 hour  Intake 13.89 ml  Output 400 ml  Net -386.11 ml     PHYSICAL EXAM: General:  Well appearing. No respiratory difficulty HEENT: normal Neck: supple. no JVD. Carotids 2+ bilat; no bruits. No lymphadenopathy or thyromegaly appreciated. Cor: PMI nondisplaced. Irregularly irregular rhythm and rate. No rubs, gallops or murmurs. Lungs: clear Abdomen: soft, nontender, nondistended. No hepatosplenomegaly. No bruits or masses. Good bowel sounds. Extremities: no cyanosis, clubbing, rash, edema Neuro: alert & oriented x 3, cranial nerves grossly intact. moves all 4 extremities w/o difficulty. Affect pleasant.   Telemetry: AF 80s. No NSVT. Personally reviewed   Labs: Basic Metabolic Panel: Recent Labs  Lab 09/05/20 1703 09/06/20 0522 09/07/20 0244 09/08/20 0244  NA 135 135 134* 131*  K 4.3 5.0 4.3 4.1  CL 109 109 105 101  CO2 18* 17* 19* 20*  GLUCOSE 96 105* 139* 104*  BUN 52* 57* 65* 75*  CREATININE 1.76* 2.01* 2.02* 2.26*  CALCIUM 8.4* 8.8* 8.9 8.7*  MG 2.1  --   --   --     Liver Function Tests: Recent Labs  Lab 09/05/20 1703  AST 26  ALT 23  ALKPHOS 78  BILITOT 1.5*  PROT  6.5  ALBUMIN 3.2*   No results for input(s): LIPASE, AMYLASE in the last 168 hours. No results for input(s): AMMONIA in the last 168 hours.  CBC: Recent Labs  Lab 09/05/20 1703  WBC 6.1  NEUTROABS 4.0  HGB 14.3  HCT 43.8  MCV 90.9  PLT 166    Cardiac Enzymes: No results for input(s): CKTOTAL, CKMB, CKMBINDEX, TROPONINI in the last 168 hours.  BNP: BNP (last 3 results) Recent Labs    09/05/20 1703  BNP 1,045.3*    ProBNP (last 3 results) No results for input(s): PROBNP in the last 8760 hours.    Other results:  Imaging: DG Chest 2 View  Result Date: 09/06/2020 CLINICAL DATA:  ICD in place. EXAM: CHEST - 2 VIEW COMPARISON:  Chest x-ray from yesterday FINDINGS: Unchanged left chest wall AICD. Stable cardiomegaly. Normal pulmonary vascularity. No focal consolidation, pleural effusion, or pneumothorax. No acute osseous abnormality. IMPRESSION: 1. No active disease. Electronically Signed   By: Obie Dredge M.D.   On: 09/06/2020 14:48     Medications:     Scheduled Medications: . furosemide  40 mg Oral Daily  . insulin aspart  0-5 Units Subcutaneous QHS  . insulin aspart  0-9 Units Subcutaneous TID WC  . metoprolol succinate  25 mg Oral Daily  . mupirocin ointment  1 application Nasal BID    Infusions: . heparin 850  Units/hr (09/07/20 2113)    PRN Medications: acetaminophen, ondansetron (ZOFRAN) IV, sodium chloride flush   Assessment/Plan:   1. Acute on Chronic Systolic Heart Failure - Non-ischemic, longstanding NICM  - Echo at [facility] 07/2018 EF 15-20%. Cath w/ mild nonobstructive CAD  - Echo here shows EF <10% - Baseline NYHA III Admitted w/ NHYA Class IV symptoms. In setting of increased fluid intake - Volume status much improved but bump in SCr/ BUN - Hold Lasix tomorrow - Lisinopril and Spiro on hold w/ elevated SCr and low BP - Continue Toprol - With his age, RV failure, CKD and compliance issues he is not ideal candidate for advanced therapies.   - Consider SGLT2i after device change if renal fx stable - Unclear if AF contributing to his cardiomyopathy. If candidate, I would favor AVN ablation with bivpacing to see if this will lead to improved LV performance.  - I don't think [facility] will change management at this point   2. ICD past ERI  - device nonfunctioning. Has recent VT/NSVT - EP following and planning gen change +/- LV lead intervention/revision +/- AVJ ablation tomorrow   3. COVID 19 Infection  - incidentally tested + on admit - asymptomatic - he is vaccinated and Boosted   4. NSVT/VT - Nonfunctioning ICD, past ERI  - plan gen change prior to d/c  - Keep K >4.0. Mg > 2.0 - Management per EP  5. Chronic Afib - rates remain mildly elevated on ? blocker - On chronic a/c w/ Xarelto - AC strategy while in-house per EP. D/w PharmD as well   6. CKD, Stage IIIb  - per Care Everywhere, SCr in 2020 was ~2.3  - 1.8 on admit>>>2.0>>2.3 today. BUN up to 75  - Hypotensive yesterday   - Hold next dose of Lasix  - Continue to hold lisinopril and spiro for now - avoid hypotension  - follow daily BMP  - consider SGLT2i soon if renal fx stabilizes    Length of Stay: 3   Brittainy Simmons PA-C  09/08/2020, 12:22 PM  Advanced Heart Failure Team Pager [phone] (M-F; 7a - 4p)  Please contact [facility] for night-coverage after hours (4p -7a ) and weekends on [email or web address]   Patient seen and examined with the above-signed Advanced Practice Provider and/or Housestaff. I personally reviewed laboratory data, imaging studies and relevant notes. I independently examined the patient and formulated the important aspects of the plan. I have edited the note to reflect any of my changes or salient points. I have personally discussed the plan with the patient and/or family.  Weight stable. SBP running in 90s. Feels ok. Denies CP or SOB. Remains in AF. VRs 90s-110.  General:  Sitting up in bed  No resp difficulty HEENT:  normal Neck: supple. no JVD. Carotids 2+ bilat; no bruits. No lymphadenopathy or thryomegaly appreciated. Cor: PMI nondisplaced. Irregular rate & rhythm. No rubs, gallops or murmurs. Lungs: clear Abdomen: soft, nontender, nondistended. No hepatosplenomegaly. No bruits or masses. Good bowel sounds. Extremities: no cyanosis, clubbing, rash, edema Neuro: alert & orientedx3, cranial nerves grossly intact. moves all 4 extremities w/o difficulty. Affect pleasant  Volume status looks on the low side. Creatinine up slightly. Hold lasix. Remains in AF. For AVN ablation and CRT-D upgrade with Dr. Lalla Brothers tomorrow.   Arvilla Meres, MD  10:37 PM

## 2020-09-09 ENCOUNTER — Inpatient Hospital Stay (HOSPITAL_COMMUNITY)
Admission: AD | Disposition: A | Discharge status: Home / Self Care | Payer: Self-pay | Source: Other Acute Inpatient Hospital | Attending: Internal Medicine

## 2020-09-09 DIAGNOSIS — Z4502 Encounter for adjustment and management of automatic implantable cardiac defibrillator: Secondary | ICD-10-CM

## 2020-09-09 DIAGNOSIS — I428 Other cardiomyopathies: Secondary | ICD-10-CM

## 2020-09-09 HISTORY — PX: ICD GENERATOR CHANGEOUT: EP1231

## 2020-09-09 LAB — GLUCOSE, CAPILLARY
Glucose-Capillary: 113 mg/dL — ABNORMAL HIGH (ref 70–99)
Glucose-Capillary: 113 mg/dL — ABNORMAL HIGH (ref 70–99)
Glucose-Capillary: 106 mg/dL — ABNORMAL HIGH (ref 70–99)
Glucose-Capillary: 114 mg/dL — ABNORMAL HIGH (ref 70–99)
Glucose-Capillary: 184 mg/dL — ABNORMAL HIGH (ref 70–99)

## 2020-09-09 LAB — BASIC METABOLIC PANEL
Anion gap: 9 (ref 5–15)
BUN: 76 mg/dL — ABNORMAL HIGH (ref 8–23)
CO2: 21 mmol/L — ABNORMAL LOW (ref 22–32)
Calcium: 8.6 mg/dL — ABNORMAL LOW (ref 8.9–10.3)
Chloride: 102 mmol/L (ref 98–111)
Creatinine, Ser: 2.16 mg/dL — ABNORMAL HIGH (ref 0.61–1.24)
GFR, Estimated: 31 mL/min — ABNORMAL LOW (ref >60)
Glucose, Bld: 122 mg/dL — ABNORMAL HIGH (ref 70–99)
Potassium: 4.5 mmol/L (ref 3.5–5.1)
Sodium: 132 mmol/L — ABNORMAL LOW (ref 135–145)

## 2020-09-09 SURGERY — ICD GENERATOR CHANGEOUT

## 2020-09-09 MED ORDER — IOHEXOL 350 MG/ML SOLN
INTRAVENOUS | Status: DC | PRN
  Administered 2020-09-09: 15 mL

## 2020-09-09 MED ORDER — ONDANSETRON HCL 4 MG/2ML IJ SOLN: 4.0000 mg | Freq: Four times a day (QID) | INTRAMUSCULAR | Status: DC | PRN

## 2020-09-09 MED ORDER — SODIUM CHLORIDE 0.9 % IV SOLN
INTRAVENOUS | Status: AC
  Filled 2020-09-09: qty 2

## 2020-09-09 MED ORDER — CEFAZOLIN SODIUM-DEXTROSE 2-4 GM/100ML-% IV SOLN
2.0000 g | INTRAVENOUS | Status: AC
  Administered 2020-09-09: 2 g via INTRAVENOUS
  Filled 2020-09-09: qty 100

## 2020-09-09 MED ORDER — LIDOCAINE HCL (PF) 1 % IJ SOLN
INTRAMUSCULAR | Status: AC
  Filled 2020-09-09: qty 60

## 2020-09-09 MED ORDER — GENTAMICIN SULFATE 40 MG/ML IJ SOLN
80.0000 mg | INTRAMUSCULAR | Status: DC
  Filled 2020-09-09: qty 2

## 2020-09-09 MED ORDER — CEFAZOLIN SODIUM-DEXTROSE 2-4 GM/100ML-% IV SOLN
INTRAVENOUS | Status: AC
  Filled 2020-09-09: qty 100

## 2020-09-09 MED ORDER — ACETAMINOPHEN 325 MG PO TABS: 325.0000 mg | ORAL_TABLET | ORAL | Status: DC | PRN

## 2020-09-09 MED ORDER — ACETAMINOPHEN 325 MG PO TABS: 650.0000 mg | ORAL_TABLET | ORAL | Status: DC | PRN

## 2020-09-09 MED ORDER — LIDOCAINE HCL (PF) 1 % IJ SOLN
INTRAMUSCULAR | Status: DC | PRN
  Administered 2020-09-09: 60 mL

## 2020-09-09 MED ORDER — SODIUM CHLORIDE 0.9 % IV SOLN: INTRAVENOUS | Status: DC

## 2020-09-09 SURGICAL SUPPLY — 6 items
CABLE SURGICAL S-101-97-12 (CABLE) ×2 IMPLANT
ICD CLARIA MRI DTMA1D4 (ICD Generator) ×2 IMPLANT
PAD PRO RADIOLUCENT 2001M-C (PAD) ×2 IMPLANT
PATCH CARTO3 (PAD) ×2 IMPLANT
POUCH AIGIS-R ANTIBACT ICD (Mesh General) ×2 IMPLANT
TRAY PACEMAKER INSERTION (PACKS) ×2 IMPLANT

## 2020-09-09 NOTE — Progress Notes (Signed)
Electrophysiology Rounding Note  Patient Name: Michael [name] Date of Encounter: 09/09/2020  Primary Cardiologist: No primary care provider on file. Electrophysiologist: Lanier Prude, MD   Subjective   The patient is doing well today.  At this time, the patient denies chest pain, shortness of breath, or any new concerns.  Inpatient Medications    Scheduled Meds: . gentamicin irrigation  80 mg Irrigation On Call  . insulin aspart  0-5 Units Subcutaneous QHS  . insulin aspart  0-9 Units Subcutaneous TID WC  . metoprolol succinate  25 mg Oral Daily  . mupirocin ointment  1 application Nasal BID   Continuous Infusions: . sodium chloride    .  ceFAZolin (ANCEF) IV     PRN Meds: acetaminophen, ondansetron (ZOFRAN) IV, sodium chloride flush   Vital Signs    Vitals:   09/08/20 1500 09/08/20 1552 09/08/20 2005 09/09/20 0623  BP:  (!) 87/62 90/73 101/73  Pulse:  91 92 89  Resp:  15 16   Temp: (!) 97 F (36.1 C) 97.6 F (36.4 C) (!) 97.5 F (36.4 C) 97.6 F (36.4 C)  TempSrc:  Oral Oral Oral  SpO2:  98% 95%   Weight:    74.3 kg  Height:        Intake/Output Summary (Last 24 hours) at 09/09/2020 2536 Last data filed at 09/09/2020 6440 Gross per 24 hour  Intake --  Output 600 ml  Net -600 ml   Filed Weights   09/07/20 0358 09/08/20 0546 09/09/20 0623  Weight: 73.8 kg 73.8 kg 74.3 kg    Physical Exam    GEN- The patient is well appearing, alert and oriented x 3 today.   Head- normocephalic, atraumatic Eyes-  Sclera clear, conjunctiva pink Ears- hearing intact Oropharynx- clear Neck- supple Lungs- Clear to ausculation bilaterally, normal work of breathing Heart- Irregularly irregular rate and rhythm, no murmurs, rubs or gallops GI- soft, NT, ND, + BS Extremities- no clubbing or cyanosis. No edema Skin- no rash or lesion Psych- euthymic mood, full affect Neuro- strength and sensation are intact  Labs    CBC No results for input(s): WBC, NEUTROABS,  HGB, HCT, MCV, PLT in the last 72 hours. Basic Metabolic Panel Recent Labs    34/74/25 0244 09/09/20 0221  NA 131* 132*  K 4.1 4.5  CL 101 102  CO2 20* 21*  GLUCOSE 104* 122*  BUN 75* 76*  CREATININE 2.26* 2.16*  CALCIUM 8.7* 8.6*   Liver Function Tests No results for input(s): AST, ALT, ALKPHOS, BILITOT, PROT, ALBUMIN in the last 72 hours. No results for input(s): LIPASE, AMYLASE in the last 72 hours. Cardiac Enzymes No results for input(s): CKTOTAL, CKMB, CKMBINDEX, TROPONINI in the last 72 hours.   Telemetry    AF 80-100s, occasional PVCs, occasional NSVT (personally reviewed)  Radiology    No results found.  Patient Profile     Michael [name] a 75 y.o.malewith a history of dilated NICM, LVEF 20-25% s/p MDT BiV ICD, HTN, HLD, mild CAD at cath 2020, COPD, CKD IIIb, and permanent AFwho is being seen today for the evaluation ofAF RVR, NSVT, and device at [facility] the request of Dr. Diona Browner.  Assessment & Plan    1.Acute on chronic systolic CHF Reports show last LVEF 20-25% in 2020 Pt on Toprol and lasix at home Now on po lasix. Holding today with AKI Ace/ARB and spiro on hold with AKI / unclear Cr baseline. Per [facility] notes (03/2019) pt "Dual site RV pacing  with the LV port programmed Subtreshold" He was then lost to / or non compliant with EP follow up. Appreciate HF team care Plan for Generator change today with left subclavian venogram. +/- addition of CS lead +/- AVJ ablation.   2. H/o VT Reports shocks as recently as 2020 and previously on amiodarone.  Cath at that time with only mild CAD Plan for ICD gen replacement today   3. CKD III Cr 2.2 this am. Baseline appears 2.0-2.2  4. Permanent AF Last dose Xarelto3/20/22. CHA2DS2VASC ofat least 6. Xarelto dose adjusted for CKD IIIB. Xarelto on hold pending procedures. Covering with heparin which will stop at midnight. He will remain off OAC 5 days s/p gen change.   4.  COVID+ Incidental.Asymptomatic and stable.  Pt is vaccinated and boosted.  For questions or updates, please contact [facility] Please consult [email or web address] for contact info under Cardiology/STEMI.  Signed, Graciella Freer, PA-C  09/09/2020, 7:12 AM

## 2020-09-09 NOTE — Plan of Care (Signed)
  Problem: Education: Goal: Knowledge of General Education information will improve Description: Including pain rating scale, medication(s)/side effects and non-pharmacologic comfort measures Outcome: Progressing   Problem: Health Behavior/Discharge Planning: Goal: Ability to manage health-related needs will improve Outcome: Progressing   Problem: Clinical Measurements: Goal: Ability to maintain clinical measurements within normal limits will improve Outcome: Progressing Goal: Will remain free from infection Outcome: Progressing Goal: Diagnostic test results will improve Outcome: Progressing Goal: Respiratory complications will improve Outcome: Progressing Goal: Cardiovascular complication will be avoided Outcome: Progressing   Problem: Activity: Goal: Risk for activity intolerance will decrease Outcome: Progressing   Problem: Nutrition: Goal: Adequate nutrition will be maintained Outcome: Progressing   Problem: Coping: Goal: Level of anxiety will decrease Outcome: Progressing   Problem: Elimination: Goal: Will not experience complications related to bowel motility Outcome: Progressing Goal: Will not experience complications related to urinary retention Outcome: Progressing   Problem: Pain Managment: Goal: General experience of comfort will improve Outcome: Progressing   Problem: Safety: Goal: Ability to remain free from injury will improve Outcome: Progressing   Problem: Skin Integrity: Goal: Risk for impaired skin integrity will decrease Outcome: Progressing   Problem: Education: Goal: Ability to demonstrate management of disease process will improve Outcome: Progressing Goal: Ability to verbalize understanding of medication therapies will improve Outcome: Progressing Goal: Individualized Educational Video(s) Outcome: Progressing   Problem: Activity: Goal: Capacity to carry out activities will improve Outcome: Progressing   Problem: Cardiac: Goal:  Ability to achieve and maintain adequate cardiopulmonary perfusion will improve Outcome: Progressing   Problem: Education: Goal: Knowledge of risk factors and measures for prevention of condition will improve Outcome: Progressing   Problem: Coping: Goal: Psychosocial and spiritual needs will be supported Outcome: Progressing   Problem: Respiratory: Goal: Will maintain a patent airway Outcome: Progressing Goal: Complications related to the disease process, condition or treatment will be avoided or minimized Outcome: Progressing   

## 2020-09-09 NOTE — Progress Notes (Signed)
Central telemetry monitor tech states that patient had a 6 beat run of PVC's.  Patient is lying in the bed resting.  Patient is asymptomatic at this time.  Will continue to monitor.   Darrick Grinder, RN

## 2020-09-09 NOTE — Progress Notes (Addendum)
Advanced Heart Failure Rounding Note   Subjective:    In Afib, 80s w/ more frequent PVCs this morning ~10/hr.   Holding lasix w/ bump in SCr/BUN. Wt up 1 lb.  SCr slightly better 2.3>>2.2. BUN 75>>76  BP remains soft, 90s systolic.   Scheduled today for generator change today with left subclavian venogram. +/- addition of CS lead +/- AVJ ablation   Objective:   Weight Range:  Vital Signs:   Temp:  [97 F (36.1 C)-97.8 F (36.6 C)] 97.7 F (36.5 C) (03/24 0753) Pulse Rate:  [62-100] 100 (03/24 0753) Resp:  [15-16] 16 (03/23 2005) BP: (87-143)/(62-109) 101/73 (03/24 0623) SpO2:  [93 %-98 %] 97 % (03/24 0753) Weight:  [74.3 kg] 74.3 kg (03/24 0623) Last BM Date: 09/07/20  Weight change: Filed Weights   09/07/20 0358 09/08/20 0546 09/09/20 0623  Weight: 73.8 kg 73.8 kg 74.3 kg    Intake/Output:   Intake/Output Summary (Last 24 hours) at 09/09/2020 1222 Last data filed at 09/09/2020 2409 Gross per 24 hour  Intake --  Output 600 ml  Net -600 ml     PHYSICAL EXAM: General:  Well appearing. No respiratory difficulty HEENT: normal Neck: supple. no JVD. Carotids 2+ bilat; no bruits. No lymphadenopathy or thyromegaly appreciated. Cor: PMI nondisplaced. Irregularly irregular rhythm and rate. No rubs, gallops or murmurs. Lungs: clear Abdomen: soft, nontender, nondistended. No hepatosplenomegaly. No bruits or masses. Good bowel sounds. Extremities: no cyanosis, clubbing, rash, edema Neuro: alert & oriented x 3, cranial nerves grossly intact. moves all 4 extremities w/o difficulty. Affect pleasant.   Telemetry: AF 80s. PVCs ~10/hr personally reviewed   Labs: Basic Metabolic Panel: Recent Labs  Lab 09/05/20 1703 09/06/20 0522 09/07/20 0244 09/08/20 0244 09/09/20 0221  NA 135 135 134* 131* 132*  K 4.3 5.0 4.3 4.1 4.5  CL 109 109 105 101 102  CO2 18* 17* 19* 20* 21*  GLUCOSE 96 105* 139* 104* 122*  BUN 52* 57* 65* 75* 76*  CREATININE 1.76* 2.01* 2.02*  2.26* 2.16*  CALCIUM 8.4* 8.8* 8.9 8.7* 8.6*  MG 2.1  --   --   --   --     Liver Function Tests: Recent Labs  Lab 09/05/20 1703  AST 26  ALT 23  ALKPHOS 78  BILITOT 1.5*  PROT 6.5  ALBUMIN 3.2*   No results for input(s): LIPASE, AMYLASE in the last 168 hours. No results for input(s): AMMONIA in the last 168 hours.  CBC: Recent Labs  Lab 09/05/20 1703  WBC 6.1  NEUTROABS 4.0  HGB 14.3  HCT 43.8  MCV 90.9  PLT 166    Cardiac Enzymes: No results for input(s): CKTOTAL, CKMB, CKMBINDEX, TROPONINI in the last 168 hours.  BNP: BNP (last 3 results) Recent Labs    09/05/20 1703  BNP 1,045.3*    ProBNP (last 3 results) No results for input(s): PROBNP in the last 8760 hours.    Other results:  Imaging: No results found.   Medications:     Scheduled Medications: . gentamicin irrigation  80 mg Irrigation On Call  . insulin aspart  0-5 Units Subcutaneous QHS  . insulin aspart  0-9 Units Subcutaneous TID WC  . metoprolol succinate  25 mg Oral Daily    Infusions: . sodium chloride 25 mL/hr at 09/09/20 0745  .  ceFAZolin (ANCEF) IV      PRN Medications: acetaminophen, ondansetron (ZOFRAN) IV, sodium chloride flush   Assessment/Plan:   1. Acute on Chronic Systolic  Heart Failure - Non-ischemic, longstanding NICM  - Echo at [facility] 07/2018 EF 15-20%. Cath w/ mild nonobstructive CAD  - Echo here shows EF <10% - Baseline NYHA III Admitted w/ NHYA Class IV symptoms. In setting of increased fluid intake - Volume status much improved but bump in SCr/ BUN + low BP  - Hold Lasix today  - Lisinopril and Spiro on hold w/ elevated SCr and low BP - Continue Toprol - With his age, RV failure, CKD and compliance issues he is not ideal candidate for advanced therapies.  - Consider SGLT2i after device change if renal fx stable - Unclear if AF contributing to his cardiomyopathy. If candidate, I would favor AVN ablation with bivpacing to see if this will lead to improved  LV performance.  - I don't think [facility] will change management at this point   2. ICD past ERI  - device nonfunctioning. Has recent VT/NSVT - EP following and planning gen change +/- LV lead intervention/revision +/- AVJ ablation today   3. COVID 19 Infection  - incidentally tested + on admit - asymptomatic - he is vaccinated and Boosted   4. NSVT/VT - Nonfunctioning ICD, past ERI  ~10 PVC/hr on tele  - plan gen change today - Keep K >4.0. Mg > 2.0 - Management per EP  5. Chronic Afib - rates remain mildly elevated on ? blocker - On chronic a/c w/ Xarelto - AC strategy while in-house per EP. D/w PharmD as well   6. CKD, Stage IIIb  - per Care Everywhere, SCr in 2020 was ~2.3  - 1.8 on admit>>>2.0>>2.3>2.2 today. BUN up to 76 - BP has been low - Hold lasix today - Continue to hold lisinopril and spiro for now - avoid hypotension. May need midodrine  - follow daily BMP  - consider SGLT2i soon if renal fx stabilizes    Length of Stay: 4   Brittainy Simmons PA-C  09/09/2020, 12:22 PM  Advanced Heart Failure Team Pager [phone] (M-F; 7a - 4p)  Please contact [facility] for night-coverage after hours (4p -7a ) and weekends on [email or web address]   Patient seen and examined with the above-signed Advanced Practice Provider and/or Housestaff. I personally reviewed laboratory data, imaging studies and relevant notes. I independently examined the patient and formulated the important aspects of the plan. I have edited the note to reflect any of my changes or salient points. I have personally discussed the plan with the patient and/or family.  Feels ok. Denies SOB, orthopnea or PND. Lasix on hold due to AKI. Creatinine improved slightly.   General:  Well appearing. No resp difficulty HEENT: normal Neck: supple. no JVD. Carotids 2+ bilat; no bruits. No lymphadenopathy or thryomegaly appreciated. Cor: PMI nondisplaced. Regular rate & rhythm. No rubs, gallops or murmurs. Lungs:  clear Abdomen: soft, nontender, nondistended. No hepatosplenomegaly. No bruits or masses. Good bowel sounds. Extremities: no cyanosis, clubbing, rash, edema Neuro: alert & orientedx3, cranial nerves grossly intact. moves all 4 extremities w/o difficulty. Affect pleasant  Stable from HF perspective for AV node ablation and CRT upgrade/revision today. Continue to hold diuretics. No BP room to titrate GDMT. Hopefully EF improves with AVN ablation and effective CRT.   Arvilla Meres, MD  4:23 PM

## 2020-09-09 NOTE — Care Management Important Message (Addendum)
Important Message  Patient Details  Name: Michael [name] MRN: [id number] Date of Birth: Dec 31, 1945   Medicare Important Message Given:  Yes  Pt. On precaution left letter on unit     Renie Ora 09/09/2020, 8:54 AM

## 2020-09-10 ENCOUNTER — Encounter (HOSPITAL_COMMUNITY): Payer: Self-pay | Admitting: Cardiology

## 2020-09-10 DIAGNOSIS — I5023 Acute on chronic systolic (congestive) heart failure: Secondary | ICD-10-CM | POA: Diagnosis not present

## 2020-09-10 LAB — BASIC METABOLIC PANEL
Anion gap: 7 (ref 5–15)
BUN: 65 mg/dL — ABNORMAL HIGH (ref 8–23)
CO2: 19 mmol/L — ABNORMAL LOW (ref 22–32)
Calcium: 8.1 mg/dL — ABNORMAL LOW (ref 8.9–10.3)
Chloride: 105 mmol/L (ref 98–111)
Creatinine, Ser: 1.88 mg/dL — ABNORMAL HIGH (ref 0.61–1.24)
GFR, Estimated: 37 mL/min — ABNORMAL LOW (ref >60)
Glucose, Bld: 136 mg/dL — ABNORMAL HIGH (ref 70–99)
Potassium: 4.1 mmol/L (ref 3.5–5.1)
Sodium: 131 mmol/L — ABNORMAL LOW (ref 135–145)

## 2020-09-10 LAB — GLUCOSE, CAPILLARY: Glucose-Capillary: 104 mg/dL — ABNORMAL HIGH (ref 70–99)

## 2020-09-10 MED ORDER — ACETAMINOPHEN 325 MG PO TABS: 650.0000 mg | ORAL_TABLET | ORAL | Status: AC | PRN

## 2020-09-10 MED ORDER — RIVAROXABAN 15 MG PO TABS: 15.0000 mg | ORAL_TABLET | Freq: Every day | ORAL | Status: AC

## 2020-09-10 MED ORDER — RIVAROXABAN 15 MG PO TABS: 15.0000 mg | ORAL_TABLET | Freq: Every day | ORAL | Status: DC

## 2020-09-10 MED FILL — Gentamicin Sulfate Inj 40 MG/ML: INTRAMUSCULAR | Qty: 80 | Status: AC

## 2020-09-10 NOTE — Progress Notes (Signed)
Patient did not have a module for the ICD in his room, spoke to [employer] PA. Patient spoke to rep from [employer] and will use the one he has at home. Notified grandson [name] about above. If there are any problems they can contact [employer].

## 2020-09-10 NOTE — Discharge Summary (Signed)
ELECTROPHYSIOLOGY PROCEDURE DISCHARGE SUMMARY    Patient ID: Michael [name],  MRN: [id number], DOB/AGE: 1945/06/24 75 y.o.  Admit date: 09/05/2020 Discharge date: 09/10/2020  Primary Care Physician: Hurshel Party, NP  Primary Cardiologist: No primary care provider on file. Primary EP: Lanier Prude, MD   Primary Discharge Diagnosis:  Active Problems:   Acute on chronic systolic (congestive) heart failure (HCC)  h/o VT Device at [facility]  Secondary Discharge Diagnosis Permanent atrial fibrillation CKD III COVID +, incidental  Allergies  Allergen Reactions  . Digoxin Shortness Of Breath    Procedures This Admission:  1.  Generator change-out of a Medtronic ICD on 09/09/2020 by Dr. Lalla Brothers. Pt received a Medtronic model number [id number].There were no immediate post procedure complications 2. Echo 09/06/2020 LVEF < 20%, RV "low normal"  Brief HPI:  Michael [name] is a 75 y.o. male admitted for acute on chronic CHF and found to have NSVT and a device at [facility]. He was referred to  Electrophysiology for consideration of generator change out due to same.  He was noted to have significant HF and AHF team consulted as well. Past medical history includes above. Risk, benefits, and alternatives to implantable cardiac device system revision were reviewed with the patient who wished to proceed.   Hospital Course:  The patient was admitted and underwent generator change to a a Medtronic Multi-Lead system (RA, RV x 2 for "dual site pacing" with one lead previousl programmed sub-threshold) with details as outlined above.  With his underlying ventricular dyssynchrony and permanent AF with variable rate control, it was determined he would benefit from CS lead placement and AV nodal ablation if candidate. Unfortunately, he had a significant left subclavian vein occlusion and CS lead placement was not able to be pursued. Initially diuresed with IV lasix then to po. Held x 1 with AKI.   He was monitored on  telemetry overnight which demonstrated AF with rates in 90-100s. Left chest was without hematoma or ecchymosis.  The device was interrogated and found to be functioning normally.  CXR was obtained and demonstrated no pneumothorax status post device implantation.  Wound care, arm mobility, and restrictions were reviewed with the patient.  The patient was examined and considered stable for discharge to home with close follow up as below.   Discussed medical regimen with HF team directly. Pt overall has a very poor prognosis.   Will discharge home on lisinopril 5 mg daily, lasix 40 mg daily with extra as needed, spironolactone 12.5 mg daily.  STOP metoprolol with acute exacerbation and ? Low output. Consider SGLT2i as outpatient; per his primary team at [facility].   We will recheck BMET at his 10 day wound check.   Physical Exam: Vitals:   09/10/20 0246 09/10/20 0424 09/10/20 0500 09/10/20 0926  BP: 102/65 92/62  93/64  Pulse: 96 98  (!) 102  Resp: 18 18  18   Temp: 98 F (36.7 C) 98 F (36.7 C)    TempSrc: Oral Oral    SpO2: 94% 97%  96%  Weight:   73.9 kg   Height:        Labs:   Lab Results  Component Value Date   WBC 6.1 09/05/2020   HGB 14.3 09/05/2020   HCT 43.8 09/05/2020   MCV 90.9 09/05/2020   PLT 166 09/05/2020    Recent Labs  Lab 09/05/20 1703 09/06/20 0522 09/10/20 0254  NA 135   < > 131*  K 4.3   < >  4.1  CL 109   < > 105  CO2 18*   < > 19*  BUN 52*   < > 65*  CREATININE 1.76*   < > 1.88*  CALCIUM 8.4*   < > 8.1*  PROT 6.5  --   --   BILITOT 1.5*  --   --   ALKPHOS 78  --   --   ALT 23  --   --   AST 26  --   --   GLUCOSE 96   < > 136*   < > = values in this interval not displayed.     Discharge Medications:  Allergies as of 09/10/2020      Reactions   Digoxin Shortness Of Breath      Medication List    STOP taking these medications   hydrALAZINE 25 MG tablet Commonly known as: APRESOLINE   metoprolol succinate 25 MG 24 hr  tablet Commonly known as: TOPROL-XL     TAKE these medications   acetaminophen 325 MG tablet Commonly known as: TYLENOL Take 2 tablets (650 mg total) by mouth every 4 (four) hours as needed for headache.   atorvastatin 20 MG tablet Commonly known as: LIPITOR Take 20 mg by mouth daily.   budesonide-formoterol 160-4.5 MCG/ACT inhaler Commonly known as: SYMBICORT Inhale 2 puffs into the lungs 2 (two) times daily.   febuxostat 40 MG tablet Commonly known as: ULORIC Take 40 mg by mouth daily.   furosemide 40 MG tablet Commonly known as: LASIX Take 40 mg by mouth daily.   insulin degludec 100 UNIT/ML FlexTouch Pen Commonly known as: TRESIBA Inject 8 Units into the skin daily at 10 pm.   lisinopril 5 MG tablet Commonly known as: ZESTRIL Take 5 mg by mouth daily.   Rivaroxaban 15 MG Tabs tablet Commonly known as: XARELTO Take 1 tablet (15 mg total) by mouth daily. Start taking on: September 15, 2020 What changed: These instructions start on September 15, 2020. If you are unsure what to do until then, ask your doctor or other care provider.   spironolactone 25 MG tablet Commonly known as: ALDACTONE Take 12.5 mg by mouth daily. What changed: Another medication with the same name was removed. Continue taking this medication, and follow the directions you see here.   traMADol-acetaminophen 37.5-325 MG tablet Commonly known as: ULTRACET Take 1 tablet by mouth as needed.       Disposition: Pt is being discharged home today in good condition.   Follow-up Information    Sheilah Pigeon, PA-C Follow up.   Specialty: Cardiology Why: 4/4 at 115 for post generator change wound check. Contact information: [address] [place] [address] [phone]        Cardiology-[facility] Follow up.   Why: on 09/23/2020 at 1130 for post hospital follow up Contact information: [address] [place], [place] [address] [phone]               Duration of Discharge Encounter: Greater than 30 minutes including physician time.  Dustin Flock, PA-C  09/10/2020 9:44 AM

## 2020-09-10 NOTE — Discharge Instructions (Signed)

## 2020-09-10 NOTE — Progress Notes (Addendum)
Electrophysiology Rounding Note  Patient Name: Michael [name] Date of Encounter: 09/10/2020  Primary Cardiologist: No primary care provider on file. Electrophysiologist: Lanier Prude, MD   Subjective   No new complaints this am. He would like to continue chronic follow up with his cardiologist/EP at [facility] as it is closer to him.   Inpatient Medications    Scheduled Meds: . insulin aspart  0-5 Units Subcutaneous QHS  . insulin aspart  0-9 Units Subcutaneous TID WC  . metoprolol succinate  25 mg Oral Daily   Continuous Infusions:  PRN Meds: acetaminophen, acetaminophen, ondansetron (ZOFRAN) IV, sodium chloride flush   Vital Signs    Vitals:   09/09/20 2326 09/10/20 0246 09/10/20 0424 09/10/20 0500  BP: 96/69 102/65 92/62   Pulse: 85 96 98   Resp: 18 18 18    Temp: 98.1 F (36.7 C) 98 F (36.7 C) 98 F (36.7 C)   TempSrc: Oral Oral Oral   SpO2: 94% 94% 97%   Weight:    73.9 kg  Height:        Intake/Output Summary (Last 24 hours) at 09/10/2020 0744 Last data filed at 09/10/2020 0545 Gross per 24 hour  Intake 327.27 ml  Output 625 ml  Net -297.73 ml   Filed Weights   09/08/20 0546 09/09/20 0623 09/10/20 0500  Weight: 73.8 kg 74.3 kg 73.9 kg    Physical Exam    GEN- The patient is well appearing, alert and oriented x 3 today.   Head- normocephalic, atraumatic Eyes-  Sclera clear, conjunctiva pink Ears- hearing intact Oropharynx- clear Neck- supple Lungs- Clear to ausculation bilaterally, normal work of breathing Heart- Irregularly irregular rate and rhythm, no murmurs, rubs or gallops GI- soft, NT, ND, + BS Extremities- no clubbing or cyanosis. No edema Skin- no rash or lesion Psych- euthymic mood, full affect Neuro- strength and sensation are intact  Labs    CBC No results for input(s): WBC, NEUTROABS, HGB, HCT, MCV, PLT in the last 72 hours. Basic Metabolic Panel Recent Labs    09/12/20 0221 09/10/20 0254  NA 132* 131*  K 4.5 4.1   CL 102 105  CO2 21* 19*  GLUCOSE 122* 136*  BUN 76* 65*  CREATININE 2.16* 1.88*  CALCIUM 8.6* 8.1*   Liver Function Tests No results for input(s): AST, ALT, ALKPHOS, BILITOT, PROT, ALBUMIN in the last 72 hours. No results for input(s): LIPASE, AMYLASE in the last 72 hours. Cardiac Enzymes No results for input(s): CKTOTAL, CKMB, CKMBINDEX, TROPONINI in the last 72 hours.   Telemetry    AF 80-90s with PVCs (personally reviewed)  Radiology    No results found.  Patient Profile     [name] a 75 y.o.malewith a history of dilated NICM, LVEF 20-25% s/p MDT BiV ICD, HTN, HLD, mild CAD at cath 2020, COPD, CKD IIIb, and permanent AFwho is being seen today for the evaluation ofAF RVR, NSVT, and device at [facility] the request of Dr. [facility].  Assessment & Plan    1. St Jude "RV dual site" and atrial lead ICD at [facility] with h/o VT S/p generator change 09/09/20 Unfortunately, he had a flush occlusion of his left subclavian, and is not a candidate for a CS lead, and therefore not a candidate for AV nodal ablation as this would promote ++RV pacing.  He is not a candidate for extraction and system revision with his severe ventricular dysfunction.  He should remain off Xarelto for a total of 5 days post  generator change. He should resume Xarelto 09/15/2020.  1.Acute on chronic systolic CHF Reports show last LVEF 20-25% in 2020 Pt on Toprol and lasix at home Lasix held with AKI.   Appreciate HF team care Unfortunately, with no means to revise his system, his prognosis remains very poor.  2. H/o VT Reports shocks as recently as 2020 and previously on amiodarone.  Cath at that time with only mild CAD S/p ICD gen change as above.   3. CKD III Cr 1.8 this am. Baseline appears 2.0-2.2  4. Permanent AF Last dose Xarelto3/20/22. CHA2DS2VASC ofat least 6. Xarelto dose adjusted for CKD IIIB. He will remain off OAC 5 days s/p gen change.Would resume 09/15/2020 as above.    4. COVID+ Incidental.Asymptomaticand stable. Pt is vaccinated and boosted.  S/p gen change 09/09/2020. Unfortunately, with occluded subclavian no avenue to pursue LV/CS lead and AV nodal ablation.   He will see [place] for generator change than has told us he wishes to follow up with his cardiologist/EP at [facility] as this is closer for him. He understands to call and make an appointment ASAP. We will also remind him again at his wound check.   ADDENDUM I discussed with him the importance of follow up with his cardiologist at [facility] and called and made him an appointment for 4/7 at 1130 at their office in [place].   For questions or updates, please contact [facility] Please consult [email or web address] for contact info under Cardiology/STEMI.  Signed, Graciella Freer, PA-C  09/10/2020, 7:44 AM

## 2020-09-16 ENCOUNTER — Encounter (HOSPITAL_COMMUNITY): Admission: EM | Disposition: E | Payer: Self-pay | Source: Home / Self Care | Attending: Neurology

## 2020-09-16 ENCOUNTER — Emergency Department (HOSPITAL_COMMUNITY): Payer: Medicare Other

## 2020-09-16 ENCOUNTER — Inpatient Hospital Stay (HOSPITAL_COMMUNITY)
Admission: EM | Admit: 2020-09-16 | Discharge: 2020-10-17 | DRG: 023 | Disposition: E | Payer: Medicare Other | Attending: Neurology | Admitting: Neurology

## 2020-09-16 ENCOUNTER — Inpatient Hospital Stay (HOSPITAL_COMMUNITY): Payer: Medicare Other

## 2020-09-16 ENCOUNTER — Emergency Department (HOSPITAL_COMMUNITY): Payer: Medicare Other | Admitting: Certified Registered"

## 2020-09-16 ENCOUNTER — Encounter (HOSPITAL_COMMUNITY): Payer: Self-pay | Admitting: Radiology

## 2020-09-16 ENCOUNTER — Other Ambulatory Visit: Payer: Self-pay

## 2020-09-16 DIAGNOSIS — R6521 Severe sepsis with septic shock: Secondary | ICD-10-CM | POA: Diagnosis not present

## 2020-09-16 DIAGNOSIS — J440 Chronic obstructive pulmonary disease with acute lower respiratory infection: Secondary | ICD-10-CM | POA: Diagnosis present

## 2020-09-16 DIAGNOSIS — I420 Dilated cardiomyopathy: Secondary | ICD-10-CM | POA: Diagnosis present

## 2020-09-16 DIAGNOSIS — K7200 Acute and subacute hepatic failure without coma: Secondary | ICD-10-CM | POA: Diagnosis not present

## 2020-09-16 DIAGNOSIS — I63412 Cerebral infarction due to embolism of left middle cerebral artery: Secondary | ICD-10-CM | POA: Diagnosis present

## 2020-09-16 DIAGNOSIS — J159 Unspecified bacterial pneumonia: Secondary | ICD-10-CM | POA: Diagnosis present

## 2020-09-16 DIAGNOSIS — I5022 Chronic systolic (congestive) heart failure: Secondary | ICD-10-CM | POA: Diagnosis present

## 2020-09-16 DIAGNOSIS — I130 Hypertensive heart and chronic kidney disease with heart failure and stage 1 through stage 4 chronic kidney disease, or unspecified chronic kidney disease: Secondary | ICD-10-CM | POA: Diagnosis present

## 2020-09-16 DIAGNOSIS — Z66 Do not resuscitate: Secondary | ICD-10-CM | POA: Diagnosis not present

## 2020-09-16 DIAGNOSIS — D6869 Other thrombophilia: Secondary | ICD-10-CM | POA: Diagnosis present

## 2020-09-16 DIAGNOSIS — I493 Ventricular premature depolarization: Secondary | ICD-10-CM | POA: Diagnosis present

## 2020-09-16 DIAGNOSIS — E875 Hyperkalemia: Secondary | ICD-10-CM | POA: Diagnosis not present

## 2020-09-16 DIAGNOSIS — E878 Other disorders of electrolyte and fluid balance, not elsewhere classified: Secondary | ICD-10-CM | POA: Diagnosis not present

## 2020-09-16 DIAGNOSIS — I4821 Permanent atrial fibrillation: Secondary | ICD-10-CM | POA: Diagnosis present

## 2020-09-16 DIAGNOSIS — H510 Palsy (spasm) of conjugate gaze: Secondary | ICD-10-CM | POA: Diagnosis present

## 2020-09-16 DIAGNOSIS — I63512 Cerebral infarction due to unspecified occlusion or stenosis of left middle cerebral artery: Secondary | ICD-10-CM

## 2020-09-16 DIAGNOSIS — E1122 Type 2 diabetes mellitus with diabetic chronic kidney disease: Secondary | ICD-10-CM | POA: Diagnosis present

## 2020-09-16 DIAGNOSIS — N183 Chronic kidney disease, stage 3 unspecified: Secondary | ICD-10-CM | POA: Diagnosis not present

## 2020-09-16 DIAGNOSIS — J9600 Acute respiratory failure, unspecified whether with hypoxia or hypercapnia: Secondary | ICD-10-CM

## 2020-09-16 DIAGNOSIS — I472 Ventricular tachycardia: Secondary | ICD-10-CM | POA: Diagnosis present

## 2020-09-16 DIAGNOSIS — E11649 Type 2 diabetes mellitus with hypoglycemia without coma: Secondary | ICD-10-CM | POA: Diagnosis not present

## 2020-09-16 DIAGNOSIS — J810 Acute pulmonary edema: Secondary | ICD-10-CM | POA: Diagnosis not present

## 2020-09-16 DIAGNOSIS — Z87891 Personal history of nicotine dependence: Secondary | ICD-10-CM

## 2020-09-16 DIAGNOSIS — N1832 Chronic kidney disease, stage 3b: Secondary | ICD-10-CM | POA: Diagnosis not present

## 2020-09-16 DIAGNOSIS — J9601 Acute respiratory failure with hypoxia: Secondary | ICD-10-CM | POA: Diagnosis not present

## 2020-09-16 DIAGNOSIS — R570 Cardiogenic shock: Secondary | ICD-10-CM

## 2020-09-16 DIAGNOSIS — R29810 Facial weakness: Secondary | ICD-10-CM | POA: Diagnosis present

## 2020-09-16 DIAGNOSIS — M1990 Unspecified osteoarthritis, unspecified site: Secondary | ICD-10-CM | POA: Diagnosis present

## 2020-09-16 DIAGNOSIS — Z452 Encounter for adjustment and management of vascular access device: Secondary | ICD-10-CM

## 2020-09-16 DIAGNOSIS — Z7189 Other specified counseling: Secondary | ICD-10-CM | POA: Diagnosis not present

## 2020-09-16 DIAGNOSIS — Z20822 Contact with and (suspected) exposure to covid-19: Secondary | ICD-10-CM | POA: Diagnosis present

## 2020-09-16 DIAGNOSIS — I4891 Unspecified atrial fibrillation: Secondary | ICD-10-CM | POA: Diagnosis not present

## 2020-09-16 DIAGNOSIS — Z95810 Presence of automatic (implantable) cardiac defibrillator: Secondary | ICD-10-CM

## 2020-09-16 DIAGNOSIS — Z8249 Family history of ischemic heart disease and other diseases of the circulatory system: Secondary | ICD-10-CM

## 2020-09-16 DIAGNOSIS — K761 Chronic passive congestion of liver: Secondary | ICD-10-CM | POA: Diagnosis present

## 2020-09-16 DIAGNOSIS — Z515 Encounter for palliative care: Secondary | ICD-10-CM | POA: Diagnosis not present

## 2020-09-16 DIAGNOSIS — Z7901 Long term (current) use of anticoagulants: Secondary | ICD-10-CM

## 2020-09-16 DIAGNOSIS — I609 Nontraumatic subarachnoid hemorrhage, unspecified: Secondary | ICD-10-CM | POA: Diagnosis present

## 2020-09-16 DIAGNOSIS — J811 Chronic pulmonary edema: Secondary | ICD-10-CM

## 2020-09-16 DIAGNOSIS — R4701 Aphasia: Secondary | ICD-10-CM | POA: Diagnosis present

## 2020-09-16 DIAGNOSIS — G8191 Hemiplegia, unspecified affecting right dominant side: Secondary | ICD-10-CM | POA: Diagnosis present

## 2020-09-16 DIAGNOSIS — R29724 NIHSS score 24: Secondary | ICD-10-CM | POA: Diagnosis present

## 2020-09-16 DIAGNOSIS — N179 Acute kidney failure, unspecified: Secondary | ICD-10-CM | POA: Diagnosis not present

## 2020-09-16 DIAGNOSIS — U099 Post covid-19 condition, unspecified: Secondary | ICD-10-CM | POA: Diagnosis present

## 2020-09-16 DIAGNOSIS — E782 Mixed hyperlipidemia: Secondary | ICD-10-CM | POA: Diagnosis present

## 2020-09-16 DIAGNOSIS — R34 Anuria and oliguria: Secondary | ICD-10-CM | POA: Diagnosis not present

## 2020-09-16 DIAGNOSIS — I2510 Atherosclerotic heart disease of native coronary artery without angina pectoris: Secondary | ICD-10-CM | POA: Diagnosis present

## 2020-09-16 DIAGNOSIS — N170 Acute kidney failure with tubular necrosis: Secondary | ICD-10-CM | POA: Diagnosis present

## 2020-09-16 DIAGNOSIS — A419 Sepsis, unspecified organism: Secondary | ICD-10-CM | POA: Diagnosis not present

## 2020-09-16 DIAGNOSIS — E1165 Type 2 diabetes mellitus with hyperglycemia: Secondary | ICD-10-CM | POA: Diagnosis present

## 2020-09-16 HISTORY — PX: RADIOLOGY WITH ANESTHESIA: SHX6223

## 2020-09-16 HISTORY — PX: IR CT HEAD LTD: IMG2386

## 2020-09-16 HISTORY — PX: IR PERCUTANEOUS ART THROMBECTOMY/INFUSION INTRACRANIAL INC DIAG ANGIO: IMG6087

## 2020-09-16 HISTORY — PX: IR US GUIDE VASC ACCESS RIGHT: IMG2390

## 2020-09-16 LAB — CBC
HCT: 46 % (ref 39.0–52.0)
Hemoglobin: 14.7 g/dL (ref 13.0–17.0)
MCH: 29.8 pg (ref 26.0–34.0)
MCHC: 32 g/dL (ref 30.0–36.0)
MCV: 93.3 fL (ref 80.0–100.0)
Platelets: 154 10*3/uL (ref 150–400)
RBC: 4.93 MIL/uL (ref 4.22–5.81)
RDW: 14.2 % (ref 11.5–15.5)
WBC: 8.5 10*3/uL (ref 4.0–10.5)
nRBC: 0 % (ref 0.0–0.2)

## 2020-09-16 LAB — COMPREHENSIVE METABOLIC PANEL
ALT: 22 U/L (ref 0–44)
AST: 40 U/L (ref 15–41)
Albumin: 3.3 g/dL — ABNORMAL LOW (ref 3.5–5.0)
Alkaline Phosphatase: 75 U/L (ref 38–126)
Anion gap: 8 (ref 5–15)
BUN: 63 mg/dL — ABNORMAL HIGH (ref 8–23)
CO2: 18 mmol/L — ABNORMAL LOW (ref 22–32)
Calcium: 8.8 mg/dL — ABNORMAL LOW (ref 8.9–10.3)
Chloride: 112 mmol/L — ABNORMAL HIGH (ref 98–111)
Creatinine, Ser: 1.8 mg/dL — ABNORMAL HIGH (ref 0.61–1.24)
GFR, Estimated: 39 mL/min — ABNORMAL LOW (ref >60)
Glucose, Bld: 233 mg/dL — ABNORMAL HIGH (ref 70–99)
Potassium: 4.8 mmol/L (ref 3.5–5.1)
Sodium: 138 mmol/L (ref 135–145)
Total Bilirubin: 1.5 mg/dL — ABNORMAL HIGH (ref 0.3–1.2)
Total Protein: 6 g/dL — ABNORMAL LOW (ref 6.5–8.1)

## 2020-09-16 LAB — POCT I-STAT 7, (LYTES, BLD GAS, ICA,H+H)
Acid-base deficit: 11 mmol/L — ABNORMAL HIGH (ref 0.0–2.0)
Acid-base deficit: 5 mmol/L — ABNORMAL HIGH (ref 0.0–2.0)
Acid-base deficit: 7 mmol/L — ABNORMAL HIGH (ref 0.0–2.0)
Bicarbonate: 16.9 mmol/L — ABNORMAL LOW (ref 20.0–28.0)
Bicarbonate: 20.6 mmol/L (ref 20.0–28.0)
Bicarbonate: 18.6 mmol/L — ABNORMAL LOW (ref 20.0–28.0)
Calcium, Ion: 1.2 mmol/L (ref 1.15–1.40)
Calcium, Ion: 1.5 mmol/L — ABNORMAL HIGH (ref 1.15–1.40)
Calcium, Ion: 1.27 mmol/L (ref 1.15–1.40)
HCT: 46 % (ref 39.0–52.0)
HCT: 38 % — ABNORMAL LOW (ref 39.0–52.0)
HCT: 41 % (ref 39.0–52.0)
Hemoglobin: 15.6 g/dL (ref 13.0–17.0)
Hemoglobin: 12.9 g/dL — ABNORMAL LOW (ref 13.0–17.0)
Hemoglobin: 13.9 g/dL (ref 13.0–17.0)
O2 Saturation: 99 %
O2 Saturation: 96 %
O2 Saturation: 94 %
Patient temperature: 32.4
Patient temperature: 32.7
Patient temperature: 97.7
Potassium: 5.7 mmol/L — ABNORMAL HIGH (ref 3.5–5.1)
Potassium: 4.5 mmol/L (ref 3.5–5.1)
Potassium: 4.9 mmol/L (ref 3.5–5.1)
Sodium: 137 mmol/L (ref 135–145)
Sodium: 141 mmol/L (ref 135–145)
Sodium: 140 mmol/L (ref 135–145)
TCO2: 18 mmol/L — ABNORMAL LOW (ref 22–32)
TCO2: 22 mmol/L (ref 22–32)
TCO2: 20 mmol/L — ABNORMAL LOW (ref 22–32)
pCO2 arterial: 37.4 mmHg (ref 32.0–48.0)
pCO2 arterial: 33 mmHg (ref 32.0–48.0)
pCO2 arterial: 36 mmHg (ref 32.0–48.0)
pH, Arterial: 7.237 — ABNORMAL LOW (ref 7.350–7.450)
pH, Arterial: 7.382 (ref 7.350–7.450)
pH, Arterial: 7.318 — ABNORMAL LOW (ref 7.350–7.450)
pO2, Arterial: 124 mmHg — ABNORMAL HIGH (ref 83.0–108.0)
pO2, Arterial: 70 mmHg — ABNORMAL LOW (ref 83.0–108.0)
pO2, Arterial: 75 mmHg — ABNORMAL LOW (ref 83.0–108.0)

## 2020-09-16 LAB — DIFFERENTIAL
Abs Immature Granulocytes: 0.04 10*3/uL (ref 0.00–0.07)
Basophils Absolute: 0.1 10*3/uL (ref 0.0–0.1)
Basophils Relative: 1 %
Eosinophils Absolute: 0.1 10*3/uL (ref 0.0–0.5)
Eosinophils Relative: 2 %
Immature Granulocytes: 1 %
Lymphocytes Relative: 31 %
Lymphs Abs: 2.6 10*3/uL (ref 0.7–4.0)
Monocytes Absolute: 0.8 10*3/uL (ref 0.1–1.0)
Monocytes Relative: 9 %
Neutro Abs: 4.8 10*3/uL (ref 1.7–7.7)
Neutrophils Relative %: 56 %

## 2020-09-16 LAB — POCT I-STAT, CHEM 8
BUN: 64 mg/dL — ABNORMAL HIGH (ref 8–23)
Calcium, Ion: 1.16 mmol/L (ref 1.15–1.40)
Chloride: 107 mmol/L (ref 98–111)
Creatinine, Ser: 1.7 mg/dL — ABNORMAL HIGH (ref 0.61–1.24)
Glucose, Bld: 185 mg/dL — ABNORMAL HIGH (ref 70–99)
HCT: 45 % (ref 39.0–52.0)
Hemoglobin: 15.3 g/dL (ref 13.0–17.0)
Potassium: 5.6 mmol/L — ABNORMAL HIGH (ref 3.5–5.1)
Sodium: 138 mmol/L (ref 135–145)
TCO2: 19 mmol/L — ABNORMAL LOW (ref 22–32)

## 2020-09-16 LAB — CBG MONITORING, ED: Glucose-Capillary: 123 mg/dL — ABNORMAL HIGH (ref 70–99)

## 2020-09-16 LAB — RESP PANEL BY RT-PCR (FLU A&B, COVID) ARPGX2
Influenza A by PCR: NEGATIVE
Influenza B by PCR: NEGATIVE
SARS Coronavirus 2 by RT PCR: POSITIVE — AB

## 2020-09-16 LAB — I-STAT CHEM 8, ED
BUN: 102 mg/dL — ABNORMAL HIGH (ref 8–23)
Calcium, Ion: 0.96 mmol/L — ABNORMAL LOW (ref 1.15–1.40)
Chloride: 112 mmol/L — ABNORMAL HIGH (ref 98–111)
Creatinine, Ser: 1.7 mg/dL — ABNORMAL HIGH (ref 0.61–1.24)
Glucose, Bld: 125 mg/dL — ABNORMAL HIGH (ref 70–99)
HCT: 45 % (ref 39.0–52.0)
Hemoglobin: 15.3 g/dL (ref 13.0–17.0)
Potassium: 6.8 mmol/L (ref 3.5–5.1)
Sodium: 136 mmol/L (ref 135–145)
TCO2: 19 mmol/L — ABNORMAL LOW (ref 22–32)

## 2020-09-16 LAB — ECHOCARDIOGRAM COMPLETE
Height: 62 in
S' Lateral: 6.7 cm
Weight: 2677.27 oz

## 2020-09-16 LAB — MAGNESIUM: Magnesium: 2 mg/dL (ref 1.7–2.4)

## 2020-09-16 LAB — GLUCOSE, CAPILLARY
Glucose-Capillary: 181 mg/dL — ABNORMAL HIGH (ref 70–99)
Glucose-Capillary: 204 mg/dL — ABNORMAL HIGH (ref 70–99)
Glucose-Capillary: 216 mg/dL — ABNORMAL HIGH (ref 70–99)

## 2020-09-16 LAB — MRSA PCR SCREENING: MRSA by PCR: NEGATIVE

## 2020-09-16 SURGERY — IR WITH ANESTHESIA
Anesthesia: General

## 2020-09-16 MED ORDER — IPRATROPIUM-ALBUTEROL 0.5-2.5 (3) MG/3ML IN SOLN
3.0000 mL | Freq: Four times a day (QID) | RESPIRATORY_TRACT | Status: DC
  Administered 2020-09-16 – 2020-09-21 (×20): 3 mL via RESPIRATORY_TRACT
  Filled 2020-09-16 (×17): qty 3

## 2020-09-16 MED ORDER — CLEVIDIPINE BUTYRATE 0.5 MG/ML IV EMUL: 0.0000 mg/h | INTRAVENOUS | Status: DC

## 2020-09-16 MED ORDER — AMIODARONE HCL IN DEXTROSE 360-4.14 MG/200ML-% IV SOLN
60.0000 mg/h | INTRAVENOUS | Status: AC
  Administered 2020-09-16 (×2): 60 mg/h via INTRAVENOUS

## 2020-09-16 MED ORDER — FENTANYL CITRATE (PF) 100 MCG/2ML IJ SOLN
INTRAMUSCULAR | Status: DC | PRN
  Administered 2020-09-16: 100 ug via INTRAVENOUS

## 2020-09-16 MED ORDER — LIDOCAINE 2% (20 MG/ML) 5 ML SYRINGE
INTRAMUSCULAR | Status: DC | PRN
  Administered 2020-09-16: 60 mg via INTRAVENOUS

## 2020-09-16 MED ORDER — ENOXAPARIN SODIUM 40 MG/0.4ML ~~LOC~~ SOLN: 40.0000 mg | Freq: Every day | SUBCUTANEOUS | Status: DC

## 2020-09-16 MED ORDER — VERAPAMIL HCL 2.5 MG/ML IV SOLN
INTRAVENOUS | Status: AC
  Filled 2020-09-16: qty 2

## 2020-09-16 MED ORDER — SUCCINYLCHOLINE CHLORIDE 200 MG/10ML IV SOSY
PREFILLED_SYRINGE | INTRAVENOUS | Status: DC | PRN
  Administered 2020-09-16: 80 mg via INTRAVENOUS

## 2020-09-16 MED ORDER — VASOPRESSIN 20 UNITS/100 ML INFUSION FOR SHOCK
0.0000 [IU]/min | INTRAVENOUS | Status: DC
  Administered 2020-09-16: 0.03 [IU]/min via INTRAVENOUS
  Filled 2020-09-16: qty 100

## 2020-09-16 MED ORDER — ESMOLOL HCL 100 MG/10ML IV SOLN
INTRAVENOUS | Status: DC | PRN
  Administered 2020-09-16 (×2): 50 mg via INTRAVENOUS

## 2020-09-16 MED ORDER — CHLORHEXIDINE GLUCONATE 0.12% ORAL RINSE (MEDLINE KIT)
15.0000 mL | Freq: Two times a day (BID) | OROMUCOSAL | Status: DC
  Administered 2020-09-16 – 2020-09-17 (×3): 15 mL via OROMUCOSAL

## 2020-09-16 MED ORDER — DILTIAZEM HCL-DEXTROSE 125-5 MG/125ML-% IV SOLN (PREMIX)
5.0000 mg/h | INTRAVENOUS | Status: DC
  Filled 2020-09-16: qty 125

## 2020-09-16 MED ORDER — PROPOFOL 1000 MG/100ML IV EMUL
5.0000 ug/kg/min | INTRAVENOUS | Status: DC
  Administered 2020-09-16: 5 ug/kg/min via INTRAVENOUS
  Administered 2020-09-16: 25 ug/kg/min via INTRAVENOUS
  Filled 2020-09-16: qty 100

## 2020-09-16 MED ORDER — CALCIUM CHLORIDE 10 % IV SOLN
INTRAVENOUS | Status: DC | PRN
  Administered 2020-09-16: 1 g via INTRAVENOUS

## 2020-09-16 MED ORDER — FENTANYL CITRATE (PF) 100 MCG/2ML IJ SOLN
INTRAMUSCULAR | Status: AC
  Filled 2020-09-16: qty 2

## 2020-09-16 MED ORDER — PHENYLEPHRINE HCL-NACL 10-0.9 MG/250ML-% IV SOLN
INTRAVENOUS | Status: AC
  Filled 2020-09-16: qty 250

## 2020-09-16 MED ORDER — ORAL CARE MOUTH RINSE
15.0000 mL | OROMUCOSAL | Status: DC
  Administered 2020-09-17 – 2020-09-28 (×105): 15 mL via OROMUCOSAL

## 2020-09-16 MED ORDER — VASOPRESSIN 20 UNIT/ML IV SOLN
INTRAVENOUS | Status: DC | PRN
  Administered 2020-09-16 (×9): 2 [IU] via INTRAVENOUS
  Administered 2020-09-16 (×2): 4 [IU] via INTRAVENOUS
  Administered 2020-09-16 (×2): 2 [IU] via INTRAVENOUS

## 2020-09-16 MED ORDER — CHLORHEXIDINE GLUCONATE CLOTH 2 % EX PADS
6.0000 | MEDICATED_PAD | Freq: Every day | CUTANEOUS | Status: DC
  Administered 2020-09-17 – 2020-09-28 (×9): 6 via TOPICAL

## 2020-09-16 MED ORDER — AMIODARONE LOAD VIA INFUSION
150.0000 mg | Freq: Once | INTRAVENOUS | Status: AC
  Administered 2020-09-16: 150 mg via INTRAVENOUS

## 2020-09-16 MED ORDER — METOPROLOL TARTRATE 5 MG/5ML IV SOLN
INTRAVENOUS | Status: DC | PRN
  Administered 2020-09-16: 2 mg via INTRAVENOUS

## 2020-09-16 MED ORDER — VASOPRESSIN 20 UNITS/100 ML INFUSION FOR SHOCK
INTRAVENOUS | Status: DC | PRN
  Administered 2020-09-16: .04 [IU]/min via INTRAVENOUS

## 2020-09-16 MED ORDER — EPTIFIBATIDE 20 MG/10ML IV SOLN
INTRAVENOUS | Status: AC
  Filled 2020-09-16: qty 10

## 2020-09-16 MED ORDER — ASPIRIN 81 MG PO CHEW
CHEWABLE_TABLET | ORAL | Status: AC
  Filled 2020-09-16: qty 1

## 2020-09-16 MED ORDER — CANGRELOR TETRASODIUM 50 MG IV SOLR
INTRAVENOUS | Status: AC
  Filled 2020-09-16: qty 50

## 2020-09-16 MED ORDER — PHENYLEPHRINE 40 MCG/ML (10ML) SYRINGE FOR IV PUSH (FOR BLOOD PRESSURE SUPPORT)
PREFILLED_SYRINGE | INTRAVENOUS | Status: DC | PRN
  Administered 2020-09-16 (×2): 120 ug via INTRAVENOUS
  Administered 2020-09-16: 80 ug via INTRAVENOUS
  Administered 2020-09-16 (×4): 120 ug via INTRAVENOUS

## 2020-09-16 MED ORDER — ETOMIDATE 2 MG/ML IV SOLN
INTRAVENOUS | Status: DC | PRN
  Administered 2020-09-16: 10 mg via INTRAVENOUS

## 2020-09-16 MED ORDER — SODIUM CHLORIDE 0.9 % IV SOLN: INTRAVENOUS | Status: DC

## 2020-09-16 MED ORDER — ACETAMINOPHEN 160 MG/5ML PO SOLN: 650.0000 mg | ORAL | Status: DC | PRN

## 2020-09-16 MED ORDER — MOMETASONE FURO-FORMOTEROL FUM 200-5 MCG/ACT IN AERO
2.0000 | INHALATION_SPRAY | Freq: Two times a day (BID) | RESPIRATORY_TRACT | Status: DC
  Filled 2020-09-16: qty 8.8

## 2020-09-16 MED ORDER — ACETAMINOPHEN 650 MG RE SUPP: 650.0000 mg | RECTAL | Status: DC | PRN

## 2020-09-16 MED ORDER — ORAL CARE MOUTH RINSE
15.0000 mL | OROMUCOSAL | Status: DC
  Administered 2020-09-16 – 2020-09-17 (×8): 15 mL via OROMUCOSAL

## 2020-09-16 MED ORDER — ATORVASTATIN CALCIUM 10 MG PO TABS: 20.0000 mg | ORAL_TABLET | Freq: Every day | ORAL | Status: DC

## 2020-09-16 MED ORDER — TICAGRELOR 90 MG PO TABS
ORAL_TABLET | ORAL | Status: AC
  Filled 2020-09-16: qty 2

## 2020-09-16 MED ORDER — ROCURONIUM BROMIDE 10 MG/ML (PF) SYRINGE
PREFILLED_SYRINGE | INTRAVENOUS | Status: DC | PRN
  Administered 2020-09-16: 30 mg via INTRAVENOUS
  Administered 2020-09-16 (×2): 20 mg via INTRAVENOUS

## 2020-09-16 MED ORDER — CLOPIDOGREL BISULFATE 300 MG PO TABS
ORAL_TABLET | ORAL | Status: AC
  Filled 2020-09-16: qty 1

## 2020-09-16 MED ORDER — IOHEXOL 350 MG/ML SOLN
125.0000 mL | Freq: Once | INTRAVENOUS | Status: AC | PRN
  Administered 2020-09-16: 125 mL via INTRAVENOUS

## 2020-09-16 MED ORDER — CALCIUM GLUCONATE-NACL 1-0.675 GM/50ML-% IV SOLN
1.0000 g | Freq: Once | INTRAVENOUS | Status: DC
  Filled 2020-09-16: qty 50

## 2020-09-16 MED ORDER — AMIODARONE HCL IN DEXTROSE 360-4.14 MG/200ML-% IV SOLN
INTRAVENOUS | Status: AC
  Filled 2020-09-16: qty 200

## 2020-09-16 MED ORDER — ACETAMINOPHEN 325 MG PO TABS: 650.0000 mg | ORAL_TABLET | ORAL | Status: DC | PRN

## 2020-09-16 MED ORDER — METOPROLOL TARTRATE 5 MG/5ML IV SOLN
INTRAVENOUS | Status: AC
  Filled 2020-09-16: qty 5

## 2020-09-16 MED ORDER — SODIUM CHLORIDE 0.9% FLUSH: 3.0000 mL | Freq: Once | INTRAVENOUS | Status: DC

## 2020-09-16 MED ORDER — SENNOSIDES-DOCUSATE SODIUM 8.6-50 MG PO TABS: 1.0000 | ORAL_TABLET | Freq: Every evening | ORAL | Status: DC | PRN

## 2020-09-16 MED ORDER — ONDANSETRON HCL 4 MG/2ML IJ SOLN: 4.0000 mg | Freq: Four times a day (QID) | INTRAMUSCULAR | Status: DC | PRN

## 2020-09-16 MED ORDER — INSULIN ASPART 100 UNIT/ML ~~LOC~~ SOLN
0.0000 [IU] | SUBCUTANEOUS | Status: DC
  Administered 2020-09-16: 5 [IU] via SUBCUTANEOUS
  Administered 2020-09-16: 3 [IU] via SUBCUTANEOUS
  Administered 2020-09-17 (×2): 5 [IU] via SUBCUTANEOUS
  Administered 2020-09-17: 3 [IU] via SUBCUTANEOUS
  Administered 2020-09-17: 2 [IU] via SUBCUTANEOUS
  Administered 2020-09-18: 3 [IU] via SUBCUTANEOUS
  Administered 2020-09-18 (×4): 5 [IU] via SUBCUTANEOUS
  Administered 2020-09-18: 8 [IU] via SUBCUTANEOUS
  Administered 2020-09-19: 5 [IU] via SUBCUTANEOUS
  Administered 2020-09-19: 2 [IU] via SUBCUTANEOUS
  Administered 2020-09-19: 3 [IU] via SUBCUTANEOUS
  Administered 2020-09-19: 2 [IU] via SUBCUTANEOUS
  Administered 2020-09-19: 5 [IU] via SUBCUTANEOUS
  Administered 2020-09-19: 3 [IU] via SUBCUTANEOUS
  Administered 2020-09-20: 2 [IU] via SUBCUTANEOUS
  Administered 2020-09-20: 3 [IU] via SUBCUTANEOUS
  Administered 2020-09-20: 2 [IU] via SUBCUTANEOUS
  Administered 2020-09-20: 3 [IU] via SUBCUTANEOUS
  Administered 2020-09-20 – 2020-09-23 (×6): 2 [IU] via SUBCUTANEOUS

## 2020-09-16 MED ORDER — PHENYLEPHRINE HCL-NACL 10-0.9 MG/250ML-% IV SOLN
25.0000 ug/min | INTRAVENOUS | Status: DC
  Administered 2020-09-16: 80 ug/min via INTRAVENOUS
  Administered 2020-09-16 (×2): 150 ug/min via INTRAVENOUS
  Administered 2020-09-16: 40 ug/min via INTRAVENOUS
  Administered 2020-09-16 – 2020-09-17 (×3): 150 ug/min via INTRAVENOUS
  Administered 2020-09-17 (×2): 200 ug/min via INTRAVENOUS
  Administered 2020-09-17: 150 ug/min via INTRAVENOUS
  Administered 2020-09-17: 170 ug/min via INTRAVENOUS
  Administered 2020-09-17: 13.333 ug/min via INTRAVENOUS
  Administered 2020-09-17 (×2): 200 ug/min via INTRAVENOUS
  Administered 2020-09-17: 190 ug/min via INTRAVENOUS
  Administered 2020-09-17: 150 ug/min via INTRAVENOUS
  Administered 2020-09-17: 13.333 ug/min via INTRAVENOUS
  Filled 2020-09-16: qty 750
  Filled 2020-09-16 (×3): qty 250
  Filled 2020-09-16: qty 500
  Filled 2020-09-16: qty 750
  Filled 2020-09-16: qty 500
  Filled 2020-09-16: qty 750
  Filled 2020-09-16: qty 500

## 2020-09-16 MED ORDER — PROPOFOL 500 MG/50ML IV EMUL
INTRAVENOUS | Status: DC | PRN
  Administered 2020-09-16: 50 ug/kg/min via INTRAVENOUS

## 2020-09-16 MED ORDER — PERFLUTREN LIPID MICROSPHERE
INTRAVENOUS | Status: AC
  Filled 2020-09-16: qty 10

## 2020-09-16 MED ORDER — SODIUM BICARBONATE 8.4 % IV SOLN
INTRAVENOUS | Status: DC | PRN
  Administered 2020-09-16 (×2): 50 meq via INTRAVENOUS

## 2020-09-16 MED ORDER — TIROFIBAN HCL IN NACL 5-0.9 MG/100ML-% IV SOLN
INTRAVENOUS | Status: AC
  Filled 2020-09-16: qty 100

## 2020-09-16 MED ORDER — AMIODARONE HCL IN DEXTROSE 360-4.14 MG/200ML-% IV SOLN
30.0000 mg/h | INTRAVENOUS | Status: DC
  Administered 2020-09-17 – 2020-09-18 (×4): 30 mg/h via INTRAVENOUS
  Filled 2020-09-16 (×5): qty 200

## 2020-09-16 MED ORDER — CHLORHEXIDINE GLUCONATE 0.12% ORAL RINSE (MEDLINE KIT)
15.0000 mL | Freq: Two times a day (BID) | OROMUCOSAL | Status: DC
  Administered 2020-09-17 – 2020-09-22 (×10): 15 mL via OROMUCOSAL

## 2020-09-16 MED ORDER — ATORVASTATIN CALCIUM 10 MG PO TABS
20.0000 mg | ORAL_TABLET | Freq: Every day | ORAL | Status: DC
  Administered 2020-09-17: 20 mg
  Filled 2020-09-16: qty 2

## 2020-09-16 MED ORDER — PANTOPRAZOLE SODIUM 40 MG IV SOLR
40.0000 mg | Freq: Every day | INTRAVENOUS | Status: DC
  Administered 2020-09-16: 40 mg via INTRAVENOUS
  Filled 2020-09-16: qty 40

## 2020-09-16 MED ORDER — SODIUM CHLORIDE 0.9 % IV SOLN: INTRAVENOUS | Status: DC | PRN

## 2020-09-16 MED ORDER — IOHEXOL 240 MG/ML SOLN
INTRAMUSCULAR | Status: AC
  Administered 2020-09-16: 75 mL
  Filled 2020-09-16: qty 200

## 2020-09-16 MED ORDER — ALBUMIN HUMAN 5 % IV SOLN: INTRAVENOUS | Status: DC | PRN

## 2020-09-16 MED ORDER — SODIUM CHLORIDE 0.9 % IV SOLN
250.0000 mL | INTRAVENOUS | Status: DC
  Administered 2020-09-18: 250 mL via INTRAVENOUS

## 2020-09-16 MED ORDER — STROKE: EARLY STAGES OF RECOVERY BOOK: Freq: Once | Status: DC

## 2020-09-16 MED ORDER — PERFLUTREN LIPID MICROSPHERE
1.0000 mL | INTRAVENOUS | Status: AC | PRN
  Administered 2020-09-16: 3 mL via INTRAVENOUS
  Filled 2020-09-16: qty 10

## 2020-09-16 NOTE — Anesthesia Preprocedure Evaluation (Signed)
Anesthesia Evaluation    Reviewed: Allergy & Precautions, Patient's Chart, lab work & pertinent test resultsPreop documentation limited or incomplete due to emergent nature of procedure.  Airway        Dental   Pulmonary COPD, former smoker,           Cardiovascular hypertension, Pt. on medications + dysrhythmias Atrial Fibrillation   ECG: rate 140. Atrial fibrillation RBBB and LAFB   Neuro/Psych negative neurological ROS  negative psych ROS   GI/Hepatic negative GI ROS, Neg liver ROS,   Endo/Other  diabetes, Insulin DependentHyperkalemia  Renal/GU Renal disease     Musculoskeletal negative musculoskeletal ROS (+)   Abdominal   Peds  Hematology HLD   Anesthesia Other Findings Code Stroke  Reproductive/Obstetrics                             Anesthesia Physical Anesthesia Plan  ASA: IV and emergent  Anesthesia Plan: General   Post-op Pain Management:    Induction: Intravenous  PONV Risk Score and Plan: 2 and Ondansetron, Dexamethasone and Treatment may vary due to age or medical condition  Airway Management Planned: Oral ETT  Additional Equipment: Arterial line  Intra-op Plan:   Post-operative Plan: Possible Post-op intubation/ventilation  Informed Consent:     Only emergency history available  Plan Discussed with: CRNA  Anesthesia Plan Comments:         Anesthesia Quick Evaluation

## 2020-09-16 NOTE — Consult Note (Addendum)
NAME:  Michael [name], MRN:  [id number], DOB:  January 15, 1946, LOS: 0 ADMISSION DATE:  10-04-20, CONSULTATION DATE:  3/31 REFERRING MD:  Stroke team Khaliqdina , CHIEF COMPLAINT:  Stroke, respiratory failure   History of Present Illness:  75 year old male w/ PMH as per below. Found by family am 3/31 w/ new onset right sided weakness and incoherent speech at 0630, LSN 0600 walking around in house. On arrival room air sats 60s.  Initial NIHS 24, had L gaze preference. CT and CTA obtained. Finding concerning for proximal left MCA occlusion vs high gd stenosis and what was felt to be more remote findings of left parieto-occipital region. Not TPA candidate due to Xarelto. While in CT had wide complex tachycardia w/ freq PVCs. Was intubated for airway protection. PCCM asked to assist w/ care in the intensive care after IR procedure completed.   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 3/31 admitted w/ new left MCA stroke right sided hemiparesis right sided facial droop and aphasia. Went emergently to IR, not IV TPA candidate due to xarelto. Wide complex tachycardia w/ PVCs while in CT. Cardiology consulted by EDP. Intubated for airway protection, Left Radial aline Placed by anesthesia. Underwent  mechanical thrombectomy and partial recannulization small device related dissection w/ SAH noted. Returned to ICU on vasopressin w/ goal BP 120-160. GCS 3 but sedated. Having runs of VT so amio bolus and infusion initiated  Interim History / Subjective:  To the ICU still sedated.   Objective   Blood pressure 130/86, pulse (Abnormal) 131, resp. rate 19, SpO2 91 %.        Intake/Output Summary (Last 24 hours) at October 04, 2020 1123 Last data filed at  2020-10-04 1031 Gross per 24 hour  Intake 250 ml  Output no documentation  Net 250 ml   There were no vitals filed for this visit.  Examination: General: chronically ill appearing 75 year old male  HENT: Pupils slow to respond. Orally intubated. MMM Lungs: diffuse rhonchi. Equal chest rise. 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  now s/p neuro-interventional for mechanical thrombectomy and partial recannulization small device related dissection w/ SAH noted.  -he presented sedated on prop GCS 3 Plan Serial neuro checks PAD protocol RASS goal -1 BP goal 120-160 F/u TTE, carotid US F/u LDH start statin if > 70 Antithrombotics and secondary stroke prevention per primary team  MRI scheduled  Acute hypoxic respiratory failure  -initial sats 60s room air, intubated for airway protection pending IR procedure.  Plan cxr F/u abg VAP bundle PAD protocol  Sputum culture  PCT  Chronic afib w/ RVR and freq PVCs and runs of VT. Has known h/o HFrEF and dilated CM (EF 20-25%), CAD, HTN and HL Has ICD Plan Rate control amio load and infusion Tele  Systemic  AC to resume when ok w/ stroke team   Hypotension. ? Drug induced Plan Change vasopressin to Neo MAP goal > 65  Acute on chronic renal failure; CKD stage 3  -looks like his creatinine is actually better than baseline (typically 1.8 to 2) but azotemia suggests some element of AKI in spite of his Cr actually at 1.7 Plan Avoid hypotension IV hydration  Renal dose meds  Strict  I&O Serial chemistries  Fluid & Electrolyte imbalance w/ hyperkalemia Plan Will administer lokelma   COVID -19 positive. This was asymptomatic and present back on 3/20 from admit on 3/20 Plan Nothing to do   DM type II Plan ssi   Best Practice (right click and "Reselect all SmartList Selections" daily)   Diet:  NPO If oral type of diet:  Pain/Anxiety/Delirium protocol (if indicated): Yes (RASS goal -1) VAP protocol (if indicated): Yes DVT prophylaxis: LMWH GI prophylaxis: PPI Glucose control:  SSI Yes Central venous access:  N/A Arterial line:  Yes, and it is still needed Foley:  Yes, and it is still needed Restraints ACTION; IS/IS NOT: is not Mobility:  bed rest  PT consulted: N/A Blood pressure target: has Target goal if applicable: 120-160 Studies pending: MRI and echo, carotid US, and sputum. also f/u chemistry  Culture data pending: sputum  Last reviewed culture data:today Antibiotics:none currently  Antibiotic de-escalation:  not indicated  Stop date: NA Daily labs: requested Code Status:  full code Prognosis: Life-threating Last date of multidisciplinary goals of care discussion [pending ]  My cct 34 min     Labs   CBC: Recent Labs  Lab 09/30/20 0840 09/30/20 0845  WBC 8.5  --   NEUTROABS 4.8  --   HGB 14.7 15.3  HCT 46.0 45.0  MCV 93.3  --   PLT 154  --     Basic Metabolic Panel: Recent Labs  Lab 09/10/20 0254 30-Sep-2020 0845  NA 131* 136  K 4.1 6.8*  CL 105 112*  CO2 19*  --   GLUCOSE 136* 125*  BUN 65* 102*  CREATININE 1.88* 1.70*  CALCIUM 8.1*  --    GFR: Estimated Creatinine Clearance: 33.1 mL/min (A) (by C-G formula based on SCr of 1.7 mg/dL (H)). Recent Labs  Lab 2020/09/30 0840  WBC 8.5    Liver Function Tests: No results for input(s): AST, ALT, ALKPHOS, BILITOT, PROT, ALBUMIN in the last 168 hours. No results for input(s): LIPASE, AMYLASE in the last 168 hours. No results for input(s): AMMONIA in the last 168  hours.  ABG    Component Value Date/Time   TCO2 19 (L) 09/30/20 0845     Coagulation Profile: No results for input(s): INR, PROTIME in the last 168 hours.  Cardiac Enzymes: No results for input(s): CKTOTAL, CKMB, CKMBINDEX, TROPONINI in the last 168 hours.  HbA1C: Hgb A1c MFr Bld  Date/Time Value Ref Range Status  09/06/2020 05:22 AM 6.6 (H) 4.8 - 5.6 % Final    Comment:    (NOTE) Pre diabetes:          5.7%-6.4%  Diabetes:              >6.4%  Glycemic control for   <7.0% adults with diabetes     CBG: Recent Labs  Lab 09/09/20 1200 09/09/20 1840 09/09/20 2135 09/10/20 0804 09/30/20 0838  GLUCAP 106* 114* 184* 104* 123*    Review of Systems:   Not able   Past Medical History:  He,  has a past medical  history of Arthritis, Cataract, Chronic atrial fibrillation (HCC), Chronic kidney disease, stage 3 unspecified (HCC), Chronic pain, Chronic systolic heart failure (HCC), COPD (chronic obstructive pulmonary disease) (HCC), Current use of long term anticoagulation, Dilated cardiomyopathy (HCC), Hyperlipidemia, Hypertension, Presence of automatic implantable cardioverter-defibrillator, Type 2 diabetes mellitus (HCC), and Ventricular tachycardia (HCC).   Surgical History:   Past Surgical History:  Procedure Laterality Date  . EYE SURGERY    . ICD GENERATOR CHANGEOUT N/A 09/09/2020   Procedure: ICD GENERATOR CHANGEOUT;  Surgeon: Lanier Prude, MD;  Location: [facility] INVASIVE CV LAB;  Service: Cardiovascular;  Laterality: N/A;  . ICD IMPLANT    . SPINE SURGERY       Social History:   reports that he has quit smoking. His smoking use included cigarettes. He has never used smokeless tobacco. He reports that he does not drink alcohol and does not use drugs.   Family History:  His family history includes Heart disease in his father.   Allergies Allergies  Allergen Reactions  . Digoxin Shortness Of Breath     Home Medications  Prior to Admission medications    Medication Sig Start Date End Date Taking? Authorizing Provider  acetaminophen (TYLENOL) 325 MG tablet Take 2 tablets (650 mg total) by mouth every 4 (four) hours as needed for headache. 09/10/20   Graciella Freer, PA-C  atorvastatin (LIPITOR) 20 MG tablet Take 20 mg by mouth daily.    [provider]  budesonide-formoterol (SYMBICORT) 160-4.5 MCG/ACT inhaler Inhale 2 puffs into the lungs 2 (two) times daily. 06/22/20   [provider]  febuxostat (ULORIC) 40 MG tablet Take 40 mg by mouth daily. 06/15/20   [provider]  furosemide (LASIX) 40 MG tablet Take 40 mg by mouth daily.    [provider]  insulin degludec (TRESIBA) 100 UNIT/ML SOPN FlexTouch Pen Inject 8 Units into the skin daily at 10 pm.    [provider]  lisinopril (PRINIVIL,ZESTRIL) 5 MG tablet Take 5 mg by mouth daily.    [provider]  Rivaroxaban (XARELTO) 15 MG TABS tablet Take 1 tablet (15 mg total) by mouth daily. 09/15/20   Graciella Freer, PA-C  spironolactone (ALDACTONE) 25 MG tablet Take 12.5 mg by mouth daily. 05/31/20   [provider]  traMADol-acetaminophen (ULTRACET) 37.5-325 MG tablet Take 1 tablet by mouth as needed. Patient not taking: Reported on 09/05/2020    [provider]     Critical care time: see above      Simonne Martinet ACNP-BC [facility] Pulmonary/Critical Care Pager # [phone] OR # [phone] if no answer

## 2020-09-16 NOTE — ED Notes (Signed)
Pt moved to trauma B as runs of Vtach continued, Dr Adela Lank at bedside

## 2020-09-16 NOTE — Code Documentation (Signed)
Stroke Response Nurse Documentation Code Documentation  Michael [name] is a 75 y.o. male arriving to [facility] ED via [facility] on 09/01/2020 with past medical hx of NICM, AF, recent generator change.. Code stroke was activated by EMS. Patient from home where he was LKW at 0630 and now complaining of Right hemiplegia, Left gaze, incoherent speech. . On Xarelto (rivaroxaban) daily.  Per recent discharge note he was not suppose to take xarelto for 5 days then restart yesterday.  Per family he took his xarelto last night.  Stroke team at the bedside on patient arrival. Labs drawn and patient cleared for CT by Dr. Adela Lank. Patient to CT with team. NIHSS 24, see documentation for details and code stroke times. Patient with decreased LOC, disoriented, not following commands, left gaze preference , right hemianopia, right facial droop, right arm weakness, right leg weakness, right decreased sensation, Global aphasia , dysarthria  and right neglect on exam. The following imaging was completed:  CT,  And CTA head and neck. Patient is not a candidate for tPA due to taking xarelto last night.   Patient prepped and taken to IR.  Hand off with Sibyl Parr and Anesthesia.  Patient is in RAF with frequent runs of VT, HR 120s-150s.  BP 106-130/75-89  RR 16-20.  O2 sats difficult to obtain.  Patient on NRB.   Marcellina Millin  Stroke Response RN

## 2020-09-16 NOTE — Progress Notes (Signed)
Per infection prevention- patient does not need to be on contact or airborne precautions since his COVID positive test was greater than 10 days ago (results seen in previous MD note since pt came from [place]). RN made aware. Lynsie Mcwatters, [place] Scrape, RN

## 2020-09-16 NOTE — Anesthesia Procedure Notes (Signed)
Arterial Line Insertion Start/End03/07/2020 9:45 AM, 09/16/2020 9:55 AM Performed by: Leonides Grills, MD, anesthesiologist  Patient location: OOR procedure area. Preanesthetic checklist: patient identified, IV checked, site marked, risks and benefits discussed, surgical consent, monitors and equipment checked, pre-op evaluation, timeout performed and anesthesia consent Emergency situation Left, radial was placed Catheter size: 20 G Seldinger technique used  Attempts: 2 Procedure performed using ultrasound guided technique. Ultrasound Notes:anatomy identified, needle tip was noted to be adjacent to the nerve/plexus identified and no ultrasound evidence of intravascular and/or intraneural injection Following insertion, dressing applied and Biopatch. Post procedure assessment: normal  Patient tolerated the procedure well with no immediate complications.

## 2020-09-16 NOTE — Consult Note (Signed)
Cardiology Consultation:   Patient ID: Michael [name] MRN: [id number]; DOB: 01/30/1946  Admit date: 2020-11-15 Date of Consult: 2020-11-15  PCP:  Hurshel PartyMoon, Amy A, NP   [facility]  Cardiologist:  Dr. Desma MaximMcGukin [facility]([place], [place]) Electrophysiologist:  New last admission, Dr. Lalla BrothersLambert   Patient Profile:   Michael [name] is a 75 y.o. male with a hx of dilated NICM, LVEF 20-25% s/p MDT BiV ICD, HTN, HLD, mild CAD at cath 2020, COPD, CKD IIIb, and permanent AF  who is being seen today for the evaluation of WCT at the request of Dr. Derry LoryKhaliqdina.  History of Present Illness:   Michael [name] was recently hospitalized 09/05/20 with acute/chronic CHF in environment of missed medicines (and unclear outpt cardiology follow up). He was also in AFib w/RVR observed to have frequent PVCs and some NSVT, his deviec noted to have been ERI sine may 2021 and EP and HF team were involved in his stay. EF was <10%, also noted RV failure, not felt an advanced therapies candidate Incidentally COVID + He was diuresed Generator change was done 09/09/20, Unable to add CS lead 2/2 stenosed subclavian vein. Discharged 09/10/20 His Xarelto held for his procedure with instructions to resume 09/15/20.  He comes with inability to speak, code stroke called, he was taken urgently to CT scanner, also noted to be in a WCT suspect and felt most c/w his baseline EKGs and suspected his underlying AF. Acute L MCA stroke confirmed and brought emergently to IR for intervention with symptom onset only 30min prior to arrival.  LABS K+ 6.8 >> 5.7 >> 4.5 BUN/Creat 102/1.70 WBC 8.5 H/H 15/45 >> 12.9 Plts 154  Pt is intubated and sedated, no ROS done   Device MDT dual chamber ICD gen change 09/09/20   Past Medical History:  Diagnosis Date  . Arthritis   . Cataract   . Chronic atrial fibrillation (HCC)   . Chronic kidney disease, stage 3 unspecified (HCC)   . Chronic pain   . Chronic systolic heart failure (HCC)     LVEF 20 to 25%  . COPD (chronic obstructive pulmonary disease) (HCC)   . Current use of long term anticoagulation   . Dilated cardiomyopathy (HCC)    Mild CAD at cardiac catheterization 2020  . Hyperlipidemia   . Hypertension   . Presence of automatic implantable cardioverter-defibrillator    Medtronic biventricular ICD  . Type 2 diabetes mellitus (HCC)   . Ventricular tachycardia [facility](HCC)     Past Surgical History:  Procedure Laterality Date  . EYE SURGERY    . ICD GENERATOR CHANGEOUT N/A 09/09/2020   Procedure: ICD GENERATOR CHANGEOUT;  Surgeon: Lanier PrudeLambert, Cameron T, MD;  [facility];  Service: Cardiovascular;  Laterality: N/A;  . ICD IMPLANT    . SPINE SURGERY       Home Medications:  Prior to Admission medications   Medication Sig Start Date End Date Taking? Authorizing Provider  acetaminophen (TYLENOL) 325 MG tablet Take 2 tablets (650 mg total) by mouth every 4 (four) hours as needed for headache. 09/10/20   Graciella Freerillery, Michael Andrew, PA-C  atorvastatin (LIPITOR) 20 MG tablet Take 20 mg by mouth daily.    [provider]  budesonide-formoterol (SYMBICORT) 160-4.5 MCG/ACT inhaler Inhale 2 puffs into the lungs 2 (two) times daily. 06/22/20   [provider]  febuxostat (ULORIC) 40 MG tablet Take 40 mg by mouth daily. 06/15/20   [provider]  furosemide (LASIX) 40 MG tablet Take 40  mg by mouth daily.    [provider]  insulin degludec (TRESIBA) 100 UNIT/ML SOPN FlexTouch Pen Inject 8 Units into the skin daily at 10 pm.    [provider]  lisinopril (PRINIVIL,ZESTRIL) 5 MG tablet Take 5 mg by mouth daily.    [provider]  Rivaroxaban (XARELTO) 15 MG TABS tablet Take 1 tablet (15 mg total) by mouth daily. 09/15/20   Graciella Freer, PA-C  spironolactone (ALDACTONE) 25 MG tablet Take 12.5 mg by mouth daily. 05/31/20   [provider]  traMADol-acetaminophen (ULTRACET) 37.5-325 MG tablet Take 1  tablet by mouth as needed. Patient not taking: Reported on 09/05/2020    [provider]    Inpatient Medications: Scheduled Meds: .  stroke: mapping our early stages of recovery book   Does not apply Once  . aspirin      . cangrelor      . clopidogrel      . diltiazem (CARDIZEM) infusion  5-15 mg/hr Intravenous To OR  . enoxaparin (LOVENOX) injection  40 mg Subcutaneous Q24H  . eptifibatide      . sodium chloride flush  3 mL Intravenous Once  . ticagrelor      . verapamil      . verapamil       Continuous Infusions: . sodium chloride    . calcium gluconate    . tirofiban     PRN Meds: acetaminophen **OR** acetaminophen (TYLENOL) oral liquid 160 mg/5 mL **OR** acetaminophen, senna-docusate  Allergies:    Allergies  Allergen Reactions  . Digoxin Shortness Of Breath    Social History:   Social History   Socioeconomic History  . Marital status: Widowed    Spouse name: Not on file  . Number of children: Not on file  . Years of education: Not on file  . Highest education level: Not on file  Occupational History  . Occupation: retired  Tobacco Use  . Smoking status: Former Smoker    Types: Cigarettes  . Smokeless tobacco: Never Used  Vaping Use  . Vaping Use: Never used  Substance and Sexual Activity  . Alcohol use: No  . Drug use: No  . Sexual activity: Not on file  Other Topics Concern  . Not on file  Social History Narrative   Lives with grandson. Does not drive.    Social Determinants of Health   Financial Resource Strain: Not on file  Food Insecurity: Not on file  Transportation Needs: Not on file  Physical Activity: Not on file  Stress: Not on file  Social Connections: Not on file  Intimate Partner Violence: Not on file    Family History:   Family History  Problem Relation Age of Onset  . Heart disease Father      ROS:  Unable to obtain     Physical Exam/Data:   Vitals:   Oct 05, 2020 0911 2020-10-05 0917 Oct 05, 2020 0941  BP: 114/75  130/86   Pulse:  (!) 131   Resp: 14 19   SpO2:  100% 91%    Intake/Output Summary (Last 24 hours) at 2020/10/05 1056 Last data filed at 05-Oct-2020 1031 Gross per 24 hour  Intake 250 ml  Output --  Net 250 ml   Last 3 Weights 09/10/2020 09/09/2020 09/08/2020  Weight (lbs) 163 lb 163 lb 14.4 oz 162 lb 11.2 oz  Weight (kg) 73.936 kg 74.345 kg 73.8 kg     There is no height or weight on file to calculate BMI.  General:  Well nourished, chronically ill appearing HEENT: normal Lymph: not assessed Neck: laying supine Endocrine: not assessed Vascular: No carotid bruits Cardiac: irreg-irreg; no loud murmurs, no gallops or rubs Lungs:  intubated Abd: soft  Ext: no edema Musculoskeletal:  No deformities Skin: warm and dry  Neuro:  Unable to assess Psych:  Unable to assess   EKG:  The EKG was personally reviewed and demonstrates:    WCT 140bpm, RBBB, LAD, slightly irregular and similar morphology and axis in comparison to old EKGs  OLD 09/05/20 AFib, RBBB, LAD 09/10/20 AFib 91, RBBB, LAD   Telemetry:  Telemetry was personally reviewed and demonstrates:   AFib 80's  In the ER note AFib with RVR and recurrent NSVT, none observed so far on the floor   Relevant CV Studies:  09/06/20: TTE IMPRESSIONS  1. Global hypokinesis of LV with apical akinesis. . Left ventricular  ejection fraction, by estimation, is <20%. The left ventricle has severely  decreased function. The left ventricle demonstrates global hypokinesis.  The left ventricular internal cavity  size was severely dilated. There is mild left ventricular hypertrophy.  Left ventricular diastolic parameters are indeterminate.  2. Right ventricular systolic function is low normal. The right  ventricular size is mildly enlarged. There is normal pulmonary artery  systolic pressure.  3. Mild mitral valve regurgitation.  4. The aortic valve is tricuspid. Aortic valve regurgitation is not  visualized. Mild to moderate aortic  valve sclerosis/calcification is  present, without any evidence of aortic stenosis.   Laboratory Data:  High Sensitivity Troponin:  No results for input(s): TROPONINIHS in the last 720 hours.   Chemistry Recent Labs  Lab 09/10/20 0254 Oct 04, 2020 0845  NA 131* 136  K 4.1 6.8*  CL 105 112*  CO2 19*  --   GLUCOSE 136* 125*  BUN 65* 102*  CREATININE 1.88* 1.70*  CALCIUM 8.1*  --   GFRNONAA 37*  --   ANIONGAP 7  --     No results for input(s): PROT, ALBUMIN, AST, ALT, ALKPHOS, BILITOT in the last 168 hours. Hematology Recent Labs  Lab 2020/10/04 0840 10/04/2020 0845  WBC 8.5  --   RBC 4.93  --   HGB 14.7 15.3  HCT 46.0 45.0  MCV 93.3  --   MCH 29.8  --   MCHC 32.0  --   RDW 14.2  --   PLT 154  --    BNPNo results for input(s): BNP, PROBNP in the last 168 hours.  DDimer No results for input(s): DDIMER in the last 168 hours.   Radiology/Studies:  CT HEAD CODE STROKE WO CONTRAST Result Date: 10-04-20 CLINICAL DATA:  Code stroke. EXAM: CT HEAD WITHOUT CONTRAST TECHNIQUE: Contiguous axial images were obtained from the base of the skull through the vertex without intravenous contrast. COMPARISON:  None. FINDINGS: Motion limited study. Brain: There is hypoattenuation and loss of gray-white differentiation in the high left parieto-occipital region. There is suggestion of cortical volume loss in this region. No mass effect. No hydrocephalus. No mass lesion. Basal cisterns are patent. No midline shift. Additional patchy white matter hypoattenuation, which is nonspecific but most likely related to chronic microvascular ischemic disease. Vascular: Calcific atherosclerosis. No hyperdense vessel identified. Skull: No acute fracture. Sinuses/Orbits: Retention cyst in the inferior left maxillary sinus with mild ethmoid air cell mucosal thickening.Unremarkable orbits. ASPECTS [facility] Stroke Program Early CT Score) Total score (0-10 with 10 being normal): 10. IMPRESSION: 1. Area of grey-white loss  and hypoattenuation in the left parieto-occipital  region is age-indeterminant without priors, but favored remote given suggestion of cortical volume loss. MRI could better evaluate for acute infarct if clinically indicated. 2. No acute hemorrhage. Code stroke imaging results were communicated on 08/24/2020 at 8:59 am to provider Dr. Ezzie Dural Via telephone, who verbally acknowledged these results. Electronically Signed   By: Feliberto Harts MD   On: 08/21/2020 09:13    CT ANGIO HEAD CODE STROKE Result Date: 08/26/2020 CLINICAL DATA:  Stroke/TIA, assess extracranial arteries. EXAM: CT ANGIOGRAPHY HEAD AND NECK TECHNIQUE: Multidetector CT imaging of the head and neck was performed using the standard protocol during bolus administration of intravenous contrast. Multiplanar CT image reconstructions and MIPs were obtained to evaluate the vascular anatomy. Carotid stenosis measurements (when applicable) are obtained utilizing NASCET criteria, using the distal internal carotid diameter as the denominator. CONTRAST:  OMNIPAQUE IOHEXOL 350 MG/ML SOLN COMPARISON:  Same day CT head. FINDINGS: CTA NECK FINDINGS Aortic arch: Imaged portion shows no evidence of aneurysm or dissection. No significant stenosis of the major arch vessel origins. Calcific atherosclerosis. Right carotid system: Mixed calcified and noncalcified atherosclerosis at the carotid bifurcation without greater than 50% stenosis. Left carotid system: Atherosclerosis at the carotid bifurcation without greater than 50% stenosis. Vertebral arteries: Mildly left dominant. Evaluation is limited by streak artifact from contrast in paraspinal veins and streak artifact at the skull base. Within this limitation, no specific evidence greater than 50% stenosis in the neck. Skeleton: Severe multilevel degenerative disc disease with disc height loss, endplate sclerosis and posterior disc osteophyte complexes. Other neck: No mass or suspicious adenopathy. Upper chest:  Expect tori imaging with left greater than right ground-glass opacities in the dependent lungs. Review of the MIP images confirms the above findings CTA HEAD FINDINGS Anterior circulation: Predominately calcific atherosclerosis of bilateral cavernous and paraclinoid internal carotid arteries. The calcific nature of plaque limits evaluation of the degree of stenosis with approximately 50% stenosis of bilateral paraclinoid ICAs. Bilateral M1 MCAs are patent without evidence of hemodynamically significant stenosis. Occlusion versus high-grade stenosis of a proximal left M2 MCA branch (series 11, image 84; series 12, image 89; series 10, image 150). Right proximal M2 MCA branches are patent. Hypoplastic/absent right A1 ACA. Left A1 and bilateral A2 ACAs are patent without evidence of proximal hemodynamically significant stenosis. No aneurysm identified. Posterior circulation: Mild-to-moderate narrowing of bilateral proximal intradural vertebral artery secondary to calcific atherosclerosis. Basilar artery and bilateral posterior cerebral arteries are patent without evidence of hemodynamically significant proximal stenosis. No aneurysm identified. Venous sinuses: As permitted by contrast timing, patent. Review of the MIP images confirms the above findings IMPRESSION: 1. Occlusion versus high-grade stenosis of a proximal left M2 MCA branch (series 11, image 84; series 12, image 89; series 10, image 150). 2. 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 3. Mild to moderate stenosis of bilateral intradural vertebral arteries. 4. Ground-glass opacities in the dependent lungs bilaterally, which may represent atelectasis in the setting of expiratory imaging, but recommend follow-up dedicated chest imaging when the patient is clinically able. Critical findings discussed with Dr. Ezzie Dural at 9:05 a.m. via telephone. Electronically Signed   By: Feliberto Harts MD   On: 08/20/2020 09:38    Assessment and Plan:   1.  WCT     EKGs reviewed with Dr. Lalla Brothers and are felt to be his AF     Rates are better  2. Permanent AFib     OAC held recently 2/2 generator change procedure, to have resumed yesterday  CHA2DS2Vasc is 4      Resume a/c when able post intervention as per interventional radiologist and neurology teams  3. NICM 4. Chronic CHS (systolic) 5. NSVT     Known for him Resume his BP, heart failure meds when able post stroke Will defer management acutely to his neurologist, CCM teams    Dr. Lalla Brothers has seen the patient        Risk Assessment/Risk Scores:  { For questions or updates, please contact [facility] Please consult [email or web address] for contact info under    Signed, Sheilah Pigeon, PA-C  09/19/20 10:56 AM

## 2020-09-16 NOTE — Progress Notes (Signed)
  Echocardiogram 2D Echocardiogram has been performed.  Michael [name] 09/08/2020, 3:50 PM

## 2020-09-16 NOTE — Transfer of Care (Signed)
Immediate Anesthesia Transfer of Care Note  Patient: Michael [name]  Procedure(s) Performed: IR WITH ANESTHESIA (N/A )  Patient Location: ICU  Anesthesia Type:General  Level of Consciousness: unresponsive and Patient remains intubated per anesthesia plan  Airway & Oxygen Therapy: Patient remains intubated per anesthesia plan and Patient placed on Ventilator (see vital sign flow sheet for setting)  Post-op Assessment: Report given to RN, Post -op Vital signs reviewed and stable and Patient moving all extremities  Post vital signs: Reviewed and stable  Last Vitals:  Vitals Value Taken Time  BP 102/80 09-29-2020 1212  Temp    Pulse 76 29-Sep-2020 1209  Resp 20 09-29-2020 1214  SpO2 99 % September 29, 2020 1209  Vitals shown include unvalidated device data.  Last Pain: There were no vitals filed for this visit.       Complications: No complications documented.

## 2020-09-16 NOTE — H&P (Addendum)
NEUROLOGY H and P NOTE   Date of service: September 16, 2020 Patient Name: Michael [name] MRN:  [id number] DOB:  [birth year] _ _ _   _ __   _ __ _ _  __ __   _ __   __ _  History of Present Illness  Michael [name] is a 75 y.o. male with PMH significant for Afibb on Xarelto(last dose in AM on 09/15/20), CKD 3, COPD, Dilated Cardiomyopathy, HLD, HTN, ICD, DM2, Vtach who woke up fine at 0500. Last seen by family at 0630 on 09/01/2020. Found down at 0700. Moaning. Immediately called 911 and he was brought in by EMS as a stroke code for R hemiplegia, aphasia, R facial droop.  Workup with CTH with no ICH, loss of grey white differentiation and hypoattenuation in the left parieto-occipital region, likely remote. CT angio concerning for proximal left M2 MCA branch occlusion vs high grade stenosis.  In the scanner, just as CT angios were about to be obtained, patient was noted to be in SVT vs Afibb with concerns for runs of VT. He was stabilized before he could be taken to IR.  NIHSS: 24 TPA: no, on Xarelto. Last dose was taken on 09/15/20 in AM per grandson on the phone. Thrombectomy: Yes for the noted occlusion/high grade stenosis of the L MCA M2. He had to be taken back to ER bed to be stabilized. Resulted in delay to IR. Patient's grandson consented to thrombectomy and I witnessed the entirety of the consent process. MRS: 0   ROS  Unable to obtain due to aphasia.  Past History   Past Medical History:  Diagnosis Date  . Arthritis   . Cataract   . Chronic atrial fibrillation (HCC)   . Chronic kidney disease, stage 3 unspecified (HCC)   . Chronic pain   . Chronic systolic heart failure (HCC)    LVEF 20 to 25%  . COPD (chronic obstructive pulmonary disease) (HCC)   . Current use of long term anticoagulation   . Dilated cardiomyopathy (HCC)    Mild CAD at cardiac catheterization 2020  . Hyperlipidemia   . Hypertension   . Presence of automatic implantable cardioverter-defibrillator    Medtronic  biventricular ICD  . Type 2 diabetes mellitus (HCC)   . Ventricular tachycardia [facility])    Past Surgical History:  Procedure Laterality Date  . EYE SURGERY    . ICD GENERATOR CHANGEOUT N/A 09/09/2020   Procedure: ICD GENERATOR CHANGEOUT;  Surgeon: Lanier Prude, MD;  [facility];  Service: Cardiovascular;  Laterality: N/A;  . ICD IMPLANT    . SPINE SURGERY     Family History  Problem Relation Age of Onset  . Heart disease Father    Social History   Socioeconomic History  . Marital status: Widowed    Spouse name: Not on file  . Number of children: Not on file  . Years of education: Not on file  . Highest education level: Not on file  Occupational History  . Occupation: retired  Tobacco Use  . Smoking status: Former Smoker    Types: Cigarettes  . Smokeless tobacco: Never Used  Vaping Use  . Vaping Use: Never used  Substance and Sexual Activity  . Alcohol use: No  . Drug use: No  . Sexual activity: Not on file  Other Topics Concern  . Not on file  Social History Narrative   Lives with grandson. Does not drive.    Social Determinants of Health  Financial Resource Strain: Not on file  Food Insecurity: Not on file  Transportation Needs: Not on file  Physical Activity: Not on file  Stress: Not on file  Social Connections: Not on file   Allergies  Allergen Reactions  . Digoxin Shortness Of Breath    Medications  (Not in a hospital admission)    Vitals   Vitals:   09-29-2020 0911 2020-09-29 0917 09-29-2020 0941  BP: 114/75 130/86   Pulse:  (!) 131   Resp: 14 19   SpO2:  100% 91%     There is no height or weight on file to calculate BMI.  Physical Exam   General: Laying comfortably in bed; in no acute distress. HENT: Normal oropharynx and mucosa. Normal external appearance of ears and nose.  Neck: Supple, no pain or tenderness  CV: No JVD. No peripheral edema.  Pulmonary: Symmetric Chest rise. Normal respiratory effort.  Abdomen: Soft  to touch, non-tender.  Ext: No cyanosis, edema, or deformity  Skin: No rash. Normal palpation of skin.   Musculoskeletal: Normal digits and nails by inspection. No clubbing.   Neurologic Examination  Mental status/Cognition: Awake, eyes open, does not follow commands. Speech/language: Mute, does not follow commands. Cranial nerves:   CN II L pupil reactive to light, R pupil is oblong and not reactive, Does not blink to threat on the right.   CN III,IV,VI L gaze deviation.   CN V    CN VII R facial droop.   CN VIII    CN IX & X    CN XI    CN XII    Motor:  Muscle bulk: poor, tone increased.  LUE: Spontaneous antigravity movement. LLE: Spontaneous antigravity movement. RUE: 1/5 RLE: 1/5  Sensation:  Light touch No response to pinch in RUE. Does withdraw in RLE. Localizes to pain in LUE and LLE.   Pin prick    Temperature    Vibration   Proprioception    Coordination/Complex Motor:  - unable to assess but no obvious ataxia out of proportion to weakness.   Labs   CBC:  Recent Labs  Lab September 29, 2020 0840 Sep 29, 2020 0845  WBC 8.5  --   NEUTROABS 4.8  --   HGB 14.7 15.3  HCT 46.0 45.0  MCV 93.3  --   PLT 154  --     Basic Metabolic Panel:  Lab Results  Component Value Date   NA 136 2020/09/29   K 6.8 (HH) 09/29/20   CO2 19 (L) 09/10/2020   GLUCOSE 125 (H) 09-29-20   BUN 102 (H) 09-29-20   CREATININE 1.70 (H) 2020-09-29   CALCIUM 8.1 (L) 09/10/2020   GFRNONAA 37 (L) 09/10/2020   Lipid Panel: No results found for: LDLCALC HgbA1c:  Lab Results  Component Value Date   HGBA1C 6.6 (H) 09/06/2020   Urine Drug Screen: No results found for: LABOPIA, COCAINSCRNUR, LABBENZ, AMPHETMU, THCU, LABBARB  Alcohol Level No results found for: [facility]  CT Head without contrast: 1. Area of grey-white loss and hypoattenuation in the left parieto-occipital region is age-indeterminant without priors, but favored remote given suggestion of cortical volume loss. MRI  could better evaluate for acute infarct if clinically indicated. 2. No acute hemorrhage.   CT angio Head and Neck with contrast: 1. Occlusion versus high-grade stenosis of a proximal left M2 MCA branch (series 11, image 84; series 12, image 89; series 10, image 150). 2. 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 3. Mild to moderate stenosis of bilateral  intradural vertebral arteries. 4. Ground-glass opacities in the dependent lungs bilaterally, which may represent atelectasis in the setting of expiratory imaging, but recommend follow-up dedicated chest imaging when the patient is clinically able.  MRI Brain  Pending.  Impression   Harnoor Kohles is a 75 y.o. male with PMH significant for Afibb on Xarelto(last dose in AM on 09/15/20), CKD 3, COPD, Dilated Cardiomyopathy, HLD, HTN, ICD, DM2, Vtach who presents with acute L MCA stroke with L MCA M2 occlusion vs high grade stenosis. No tPA 2/2 Xarelto within 48 hours. Taken for thrombectomy.  Recommendations  Plan:  - Admit to ICU - Frequent Neuro checks per stroke unit protocol - Brain imaging with MRI Brain without contrast - Vascular imaging with MRA Angio Head without contrast and US Carotid doppler - TTE pending. - LDL pending. - Please start statin if LDL > 70 - HbA1c pending. - Antithrombotic - Per Neuro IR pending outcome of endovascular revascularization - DVT ppx - SBP goal - Per Neuro IR pending outcome of endovascular revascularization - Recommend Telemetry monitoring for arrythmia - Recommend bedside swallow screen prior to PO intake. - Stroke education booklet - Recommend PT/OT/SLP consult  Afibb with RVR vs SVT in the ED with dilated Cardiomyopathy: - Cardiology consulted by the ED provider. - Waiting on further recs per cadiology. - ICD interrogation - Hold off on Lasix and spironolactone for now.  - TTE for Stroke as above   CKD 3: - Creatinine of 1.7, consistent with baseline. - gentle  hydration - BMP in AM  DM2: - Q4H Glucose checks - SSI    ______________________________________________________________________   Thank you for the opportunity to take part in the care of this patient. If you have any further questions, please contact the neurology consultation attending.  This patient is critically ill and at significant risk of neurological worsening, death and care requires constant monitoring of vital signs, hemodynamics,respiratory and cardiac monitoring, neurological assessment, discussion with family, other specialists and medical decision making of high complexity. I spent 90 minutes of neurocritical care time  in the care of  this patient. This was time spent independent of any time provided by nurse practitioner or PA.  Erick Blinks [facility] Pager Number [phone] Oct 02, 2020  11:11 AM  Signed,  Erick Blinks [facility] Pager Number [phone] _ _ _   _ __   _ __ _ _  __ __   _ __   __ _

## 2020-09-16 NOTE — ED Triage Notes (Signed)
Pt arrived to ED via EMS from home. Per EMS called for SOB sx, on arrival pt was found sitting in chair with right side deficits and incoherent speech. Family report pt was up walking around at 0630 this morning, with this being last time know well. Spo2 was in the 60's on RA, EMS placed NRB with improved Spo2 90's, did maintain airway. HR 120 in Afib. Code stroke activated at [facility]

## 2020-09-16 NOTE — Procedures (Signed)
INTERVENTIONAL NEURORADIOLOGY BRIEF POSTPROCEDURE NOTE  Diagnostic cerebral angiogram and mechanical thrombectomy   Attending: Dr. Baldemar Lenis  Assistant: None.   Diagnosis: Left M2/MCA occlusion.   Access site: Right common femoral artery, 8 [place].   Access closure: 8 [place] Angio-Seal.   Anesthesia: General anesthesia.   Medication used: Refer to anesthesia documentation.  Complications: Small left sylvian subarachnoid hemorrhage.   Estimated blood loss: 100 mL   Specimen: None.   Findings: There was mid left M2/MCA dominance superior division branch.  Mechanical thrombectomy performed with 1 pass aspiration and 3 passes with stent retriever and combined aspiration (total 4 passes).  Partial recanalization achieved (TICI2b).  Device related dissection with small subarachnoid hemorrhage seen.  No active contrast extravasation.   Patient remained intubated due to COVID positive.   SBP 120-160 mgHg (partial recanalization with subarachnoid hemorrhage).   Family updated via telephone.

## 2020-09-16 NOTE — Progress Notes (Signed)
Neurointerventional Radiology Brief Note:  Patient assessed this afternoon s/p mechanical thrombectomy with TICI2b recanalization complicated by dissection with small subarachnoid hemorrhage without contrast extravasation.   Patient remains intubated.  No longer on COVID precautions due to infection >10 days ago per ID.   Cardiac instability with runs of VTACH vs. Wide complex a fib.  Started on amiodarone.  On propofol for sedation.  Neo drip for BP management.  BP goal 120-160.  Currently 108/63 with titration in progress.   Groin site assessed.  Intact, clean, and dry. No evidence of pseudoaneurysm or hematoma. Pedal pulses unable to be identified bilaterally consistent with previous exam.  Feet cool but equal, no limb discrepancies.  Note skin tear superior to procedure site.   No MRI due to recent ICD placement.   Continue current management.   Loyce Dys, MS RD PA-C 5:01 PM

## 2020-09-16 NOTE — Progress Notes (Signed)
Patient arrived to 4N31 with no belongings.

## 2020-09-16 NOTE — Progress Notes (Signed)
PT Cancellation Note  Patient Details Name: Michael [name] MRN: [id number] DOB: 1945/07/18   Cancelled Treatment:    Reason Eval/Treat Not Completed: Patient's level of consciousness;Patient not medically ready. RN requesting hold on PT this date due to pt being intubated and not arousing. Will follow-up another day as appropriate.   Raymond Gurney, PT, DPT Acute Rehabilitation Services  Pager: [phone] Office: [phone]    Jewel Baize October 11, 2020, 2:38 PM

## 2020-09-16 NOTE — ED Notes (Signed)
Pt with noted runs of Vtach on monitor, pt remains with pulse and radial pulses palpable.   Dr Adela Lank aware and returned to ct scan to dicussed with neurology.

## 2020-09-16 NOTE — ED Provider Notes (Signed)
[facility] EMERGENCY DEPARTMENT Provider Note   CSN: [id number] Arrival date & time: 09/10/2020  9833  An emergency department physician performed an initial assessment on this suspected stroke patient at 0835.  History Chief Complaint  Patient presents with  . Code Stroke    Jeremiah Curci is a 75 y.o. male.  75 yo M with a chief complaints of acute strokelike symptoms.  Reportedly was normal or so 30 minutes prior to arrival.  Was recently in the hospital and had a pacemaker generator change.  Patient is nonverbal on arrival.  Level 5 caveat        Past Medical History:  Diagnosis Date  . Arthritis   . Cataract   . Chronic atrial fibrillation (HCC)   . Chronic kidney disease, stage 3 unspecified (HCC)   . Chronic pain   . Chronic systolic heart failure (HCC)    LVEF 20 to 25%  . COPD (chronic obstructive pulmonary disease) (HCC)   . Current use of long term anticoagulation   . Dilated cardiomyopathy (HCC)    Mild CAD at cardiac catheterization 2020  . Hyperlipidemia   . Hypertension   . Presence of automatic implantable cardioverter-defibrillator    [employer] biventricular ICD  . Type 2 diabetes mellitus (HCC)   . Ventricular tachycardia [facility])     Patient Active Problem List   Diagnosis Date Noted  . Acute ischemic left MCA stroke (HCC) 08/30/2020  . Acute on chronic systolic (congestive) heart failure (HCC) 09/05/2020  . Coronary artery disease involving native coronary artery of native heart without angina pectoris 09/12/2019  . Former smoker 04/03/2019  . Mixed hyperlipidemia 04/03/2019  . Current use of long term anticoagulation 08/23/2017  . Chronic kidney disease, stage 3 unspecified (HCC) 12/03/2014  . Presence of automatic implantable cardioverter-defibrillator 12/03/2014  . Chronic atrial fibrillation (HCC) 12/02/2014  . Chronic systolic heart failure (HCC) 12/02/2014  . Dilated cardiomyopathy (HCC) 12/02/2014  . Ventricular  tachycardia (HCC) 12/02/2014    Past Surgical History:  Procedure Laterality Date  . EYE SURGERY    . ICD GENERATOR CHANGEOUT N/A 09/09/2020   Procedure: ICD GENERATOR CHANGEOUT;  Surgeon: Lanier Prude, MD;  Location: [facility] INVASIVE CV LAB;  Service: Cardiovascular;  Laterality: N/A;  . ICD IMPLANT    . SPINE SURGERY         Family History  Problem Relation Age of Onset  . Heart disease Father     Social History   Tobacco Use  . Smoking status: Former Smoker    Types: Cigarettes  . Smokeless tobacco: Never Used  Vaping Use  . Vaping Use: Never used  Substance Use Topics  . Alcohol use: No  . Drug use: No    Home Medications Prior to Admission medications   Medication Sig Start Date End Date Taking? Authorizing Provider  acetaminophen (TYLENOL) 325 MG tablet Take 2 tablets (650 mg total) by mouth every 4 (four) hours as needed for headache. 09/10/20   Graciella Freer, PA-C  atorvastatin (LIPITOR) 20 MG tablet Take 20 mg by mouth daily.    [provider]  budesonide-formoterol (SYMBICORT) 160-4.5 MCG/ACT inhaler Inhale 2 puffs into the lungs 2 (two) times daily. 06/22/20   [provider]  febuxostat (ULORIC) 40 MG tablet Take 40 mg by mouth daily. 06/15/20   [provider]  furosemide (LASIX) 40 MG tablet Take 40 mg by mouth daily.    [provider]  insulin degludec (TRESIBA) 100 UNIT/ML SOPN FlexTouch  Pen Inject 8 Units into the skin daily at 10 pm.    [provider]  lisinopril (PRINIVIL,ZESTRIL) 5 MG tablet Take 5 mg by mouth daily.    [provider]  Rivaroxaban (XARELTO) 15 MG TABS tablet Take 1 tablet (15 mg total) by mouth daily. 09/15/20   Graciella Freer, PA-C  spironolactone (ALDACTONE) 25 MG tablet Take 12.5 mg by mouth daily. 05/31/20   [provider]  traMADol-acetaminophen (ULTRACET) 37.5-325 MG tablet Take 1 tablet by mouth as needed. Patient not taking: Reported on 09/05/2020     [provider]    Allergies    Digoxin  Review of Systems   Review of Systems  Unable to perform ROS: Mental status change    Physical Exam Updated Vital Signs BP 130/86   Pulse (!) 131   Resp 19   SpO2 91%   Physical Exam Vitals and nursing note reviewed.  Constitutional:      Appearance: He is well-developed.  HENT:     Head: Normocephalic and atraumatic.  Eyes:     Pupils: Pupils are equal, round, and reactive to light.  Neck:     Vascular: No JVD.  Cardiovascular:     Rate and Rhythm: Normal rate and regular rhythm.     Heart sounds: No murmur heard. No friction rub. No gallop.   Pulmonary:     Effort: No respiratory distress.     Breath sounds: No wheezing.  Abdominal:     General: There is no distension.     Tenderness: There is no guarding or rebound.  Musculoskeletal:        General: Normal range of motion.     Cervical back: Normal range of motion and neck supple.  Skin:    Coloration: Skin is not pale.     Findings: No rash.  Neurological:     Mental Status: He is alert.     Comments: Right-sided weakness left gaze deviation aphasic  Psychiatric:        Behavior: Behavior normal.     ED Results / Procedures / Treatments   Labs (all labs ordered are listed, but only abnormal results are displayed) Labs Reviewed  CBG MONITORING, ED - Abnormal; Notable for the following components:      Result Value   Glucose-Capillary 123 (*)    All other components within normal limits  I-STAT CHEM 8, ED - Abnormal; Notable for the following components:   Potassium 6.8 (*)    Chloride 112 (*)    BUN 102 (*)    Creatinine, Ser 1.70 (*)    Glucose, Bld 125 (*)    Calcium, Ion 0.96 (*)    TCO2 19 (*)    All other components within normal limits  RESP PANEL BY RT-PCR (FLU A&B, COVID) ARPGX2  CBC  DIFFERENTIAL  CBC  CREATININE, SERUM  CBG MONITORING, ED    EKG EKG Interpretation  Date/Time:  Thursday September 16 2020 09:11:17  EDT Ventricular Rate:  140 PR Interval:    QRS Duration: 159 QT Interval:  369 QTC Calculation: 564 R Axis:   264 Text Interpretation: Atrial fibrillation RBBB and LAFB Since last tracing rate faster Otherwise no significant change Confirmed by Melene Plan [phone]) on 09/15/2020 9:19:55 AM   Radiology CT HEAD CODE STROKE WO CONTRAST  Result Date: 09/07/2020 CLINICAL DATA:  Code stroke. EXAM: CT HEAD WITHOUT CONTRAST TECHNIQUE: Contiguous axial images were obtained from the base of the skull through the vertex without  intravenous contrast. COMPARISON:  None. FINDINGS: Motion limited study. Brain: There is hypoattenuation and loss of gray-white differentiation in the high left parieto-occipital region. There is suggestion of cortical volume loss in this region. No mass effect. No hydrocephalus. No mass lesion. Basal cisterns are patent. No midline shift. Additional patchy white matter hypoattenuation, which is nonspecific but most likely related to chronic microvascular ischemic disease. Vascular: Calcific atherosclerosis. No hyperdense vessel identified. Skull: No acute fracture. Sinuses/Orbits: Retention cyst in the inferior left maxillary sinus with mild ethmoid air cell mucosal thickening.Unremarkable orbits. ASPECTS [facility] Stroke Program Early CT Score) Total score (0-10 with 10 being normal): 10. IMPRESSION: 1. Area of grey-white loss and hypoattenuation in the left parieto-occipital region is age-indeterminant without priors, but favored remote given suggestion of cortical volume loss. MRI could better evaluate for acute infarct if clinically indicated. 2. No acute hemorrhage. Code stroke imaging results were communicated on 2020/09/26 at 8:59 am to provider Dr. Ezzie Dural Via telephone, who verbally acknowledged these results. Electronically Signed   By: Feliberto Harts MD   On: 09/26/2020 09:13   CT ANGIO HEAD CODE STROKE  Result Date: 09-26-20 CLINICAL DATA:  Stroke/TIA, assess extracranial  arteries. EXAM: CT ANGIOGRAPHY HEAD AND NECK TECHNIQUE: Multidetector CT imaging of the head and neck was performed using the standard protocol during bolus administration of intravenous contrast. Multiplanar CT image reconstructions and MIPs were obtained to evaluate the vascular anatomy. Carotid stenosis measurements (when applicable) are obtained utilizing NASCET criteria, using the distal internal carotid diameter as the denominator. CONTRAST:  OMNIPAQUE IOHEXOL 350 MG/ML SOLN COMPARISON:  Same day CT head. FINDINGS: CTA NECK FINDINGS Aortic arch: Imaged portion shows no evidence of aneurysm or dissection. No significant stenosis of the major arch vessel origins. Calcific atherosclerosis. Right carotid system: Mixed calcified and noncalcified atherosclerosis at the carotid bifurcation without greater than 50% stenosis. Left carotid system: Atherosclerosis at the carotid bifurcation without greater than 50% stenosis. Vertebral arteries: Mildly left dominant. Evaluation is limited by streak artifact from contrast in paraspinal veins and streak artifact at the skull base. Within this limitation, no specific evidence greater than 50% stenosis in the neck. Skeleton: Severe multilevel degenerative disc disease with disc height loss, endplate sclerosis and posterior disc osteophyte complexes. Other neck: No mass or suspicious adenopathy. Upper chest: Expect tori imaging with left greater than right ground-glass opacities in the dependent lungs. Review of the MIP images confirms the above findings CTA HEAD FINDINGS Anterior circulation: Predominately calcific atherosclerosis of bilateral cavernous and paraclinoid internal carotid arteries. The calcific nature of plaque limits evaluation of the degree of stenosis with approximately 50% stenosis of bilateral paraclinoid ICAs. Bilateral M1 MCAs are patent without evidence of hemodynamically significant stenosis. Occlusion versus high-grade stenosis of a proximal left  M2 MCA branch (series 11, image 84; series 12, image 89; series 10, image 150). Right proximal M2 MCA branches are patent. Hypoplastic/absent right A1 ACA. Left A1 and bilateral A2 ACAs are patent without evidence of proximal hemodynamically significant stenosis. No aneurysm identified. Posterior circulation: Mild-to-moderate narrowing of bilateral proximal intradural vertebral artery secondary to calcific atherosclerosis. Basilar artery and bilateral posterior cerebral arteries are patent without evidence of hemodynamically significant proximal stenosis. No aneurysm identified. Venous sinuses: As permitted by contrast timing, patent. Review of the MIP images confirms the above findings IMPRESSION: 1. Occlusion versus high-grade stenosis of a proximal left M2 MCA branch (series 11, image 84; series 12, image 89; series 10, image 150). 2. Approximately 50% stenosis of bilateral paraclinoid  ICAs. 3. Mild to moderate stenosis of bilateral intradural vertebral arteries. 4. Ground-glass opacities in the dependent lungs bilaterally, which may represent atelectasis in the setting of expiratory imaging, but recommend follow-up dedicated chest imaging when the patient is clinically able. Critical findings discussed with Dr. Ezzie Dural at 9:05 a.m. via telephone. Electronically Signed   By: Feliberto Harts MD   On: 2020/10/05 09:38   CT ANGIO NECK CODE STROKE  Result Date: Oct 05, 2020 CLINICAL DATA:  Stroke/TIA, assess extracranial arteries. EXAM: CT ANGIOGRAPHY HEAD AND NECK TECHNIQUE: Multidetector CT imaging of the head and neck was performed using the standard protocol during bolus administration of intravenous contrast. Multiplanar CT image reconstructions and MIPs were obtained to evaluate the vascular anatomy. Carotid stenosis measurements (when applicable) are obtained utilizing NASCET criteria, using the distal internal carotid diameter as the denominator. CONTRAST:  OMNIPAQUE IOHEXOL 350 MG/ML SOLN COMPARISON:   Same day CT head. FINDINGS: CTA NECK FINDINGS Aortic arch: Imaged portion shows no evidence of aneurysm or dissection. No significant stenosis of the major arch vessel origins. Calcific atherosclerosis. Right carotid system: Mixed calcified and noncalcified atherosclerosis at the carotid bifurcation without greater than 50% stenosis. Left carotid system: Atherosclerosis at the carotid bifurcation without greater than 50% stenosis. Vertebral arteries: Mildly left dominant. Evaluation is limited by streak artifact from contrast in paraspinal veins and streak artifact at the skull base. Within this limitation, no specific evidence greater than 50% stenosis in the neck. Skeleton: Severe multilevel degenerative disc disease with disc height loss, endplate sclerosis and posterior disc osteophyte complexes. Other neck: No mass or suspicious adenopathy. Upper chest: Expect tori imaging with left greater than right ground-glass opacities in the dependent lungs. Review of the MIP images confirms the above findings CTA HEAD FINDINGS Anterior circulation: Predominately calcific atherosclerosis of bilateral cavernous and paraclinoid internal carotid arteries. The calcific nature of plaque limits evaluation of the degree of stenosis with approximately 50% stenosis of bilateral paraclinoid ICAs. Bilateral M1 MCAs are patent without evidence of hemodynamically significant stenosis. Occlusion versus high-grade stenosis of a proximal left M2 MCA branch (series 11, image 84; series 12, image 89; series 10, image 150). Right proximal M2 MCA branches are patent. Hypoplastic/absent right A1 ACA. Left A1 and bilateral A2 ACAs are patent without evidence of proximal hemodynamically significant stenosis. No aneurysm identified. Posterior circulation: Mild-to-moderate narrowing of bilateral proximal intradural vertebral artery secondary to calcific atherosclerosis. Basilar artery and bilateral posterior cerebral arteries are patent without  evidence of hemodynamically significant proximal stenosis. No aneurysm identified. Venous sinuses: As permitted by contrast timing, patent. Review of the MIP images confirms the above findings IMPRESSION: 1. Occlusion versus high-grade stenosis of a proximal left M2 MCA branch (series 11, image 84; series 12, image 89; series 10, image 150). 2. 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 3. Mild to moderate stenosis of bilateral intradural vertebral arteries. 4. Ground-glass opacities in the dependent lungs bilaterally, which may represent atelectasis in the setting of expiratory imaging, but recommend follow-up dedicated chest imaging when the patient is clinically able. Critical findings discussed with Dr. Ezzie Dural at 9:05 a.m. via telephone. Electronically Signed   By: Feliberto Harts MD   On: Oct 05, 2020 09:38    Procedures Procedures   Medications Ordered in ED Medications  sodium chloride flush (NS) 0.9 % injection 3 mL (has no administration in time range)  calcium gluconate 1 g/ 50 mL sodium chloride IVPB (has no administration in time range)  aspirin 81 MG chewable tablet (has no administration  in time range)  verapamil (ISOPTIN) 2.5 MG/ML injection (has no administration in time range)  ticagrelor (BRILINTA) 90 MG tablet (has no administration in time range)  clopidogrel (PLAVIX) 300 MG tablet (has no administration in time range)  cangrelor (KENGREAL) 50 MG SOLR (has no administration in time range)  tirofiban (AGGRASTAT) 5-0.9 MG/100ML-% injection (has no administration in time range)  eptifibatide (INTEGRILIN) 20 MG/10ML injection (has no administration in time range)  verapamil (ISOPTIN) 2.5 MG/ML injection (has no administration in time range)  diltiazem (CARDIZEM) 125 mg in dextrose 5% 125 mL (1 mg/mL) infusion (has no administration in time range)   stroke: mapping our early stages of recovery book (has no administration in time range)  0.9 %  sodium chloride infusion  (has no administration in time range)  acetaminophen (TYLENOL) tablet 650 mg (has no administration in time range)    Or  acetaminophen (TYLENOL) 160 MG/5ML solution 650 mg (has no administration in time range)    Or  acetaminophen (TYLENOL) suppository 650 mg (has no administration in time range)  senna-docusate (Senokot-S) tablet 1 tablet (has no administration in time range)  enoxaparin (LOVENOX) injection 40 mg (has no administration in time range)  iohexol (OMNIPAQUE) 350 MG/ML injection 125 mL (125 mLs Intravenous Contrast Given 10/04/20 0919)  fentaNYL (SUBLIMAZE) 100 MCG/2ML injection (  Override pull for Anesthesia 10/04/2020 1001)  iohexol (OMNIPAQUE) 240 MG/ML injection (75 mLs  Contrast Given 2020/10/04 1046)  metoprolol tartrate (LOPRESSOR) 5 MG/5ML injection (  Override pull for Anesthesia 04-Oct-2020 1023)    ED Course  I have reviewed the triage vital signs and the nursing notes.  Pertinent labs & imaging results that were available during my care of the patient were reviewed by me and considered in my medical decision making (see chart for details).    MDM Rules/Calculators/A&P                          75 yo M with a chief complaints of acute stroke symptoms.  Seen at the bridge and airway cleared and taken emergently to CT.  There he reportedly had frequent runs of ventricular tachycardia.  He was able to maintain a pulse and had CP imaging completed.  Concern for an M2 occlusion.  Neurology to take urgently to IR.  Patient with wide-complex tachycardia, irregular.  Looking at his notes likely consistent with his atrial fibrillation with widened QRS.  He does have frequent PVCs.  Is maintaining a blood pressure.  I did discuss the case with cardiology, Dr. Mayford Knife plan to send someone on the electrophysiology team to evaluate the patient.  I discussed this with neurology felt that this case was emergent and could not wait for a cardiology consult and was taken directly to the IR  suite.  CRITICAL CARE Performed by: Rae Roam   Total critical care time: 80 minutes  Critical care time was exclusive of separately billable procedures and treating other patients.  Critical care was necessary to treat or prevent imminent or life-threatening deterioration.  Critical care was time spent personally by me on the following activities: development of treatment plan with patient and/or surrogate as well as nursing, discussions with consultants, evaluation of patient's response to treatment, examination of patient, obtaining history from patient or surrogate, ordering and performing treatments and interventions, ordering and review of laboratory studies, ordering and review of radiographic studies, pulse oximetry and re-evaluation of patient's condition.  The patients results and plan  were reviewed and discussed.   Any x-rays performed were independently reviewed by myself.   Differential diagnosis were considered with the presenting HPI.  Medications  sodium chloride flush (NS) 0.9 % injection 3 mL (has no administration in time range)  calcium gluconate 1 g/ 50 mL sodium chloride IVPB (has no administration in time range)  aspirin 81 MG chewable tablet (has no administration in time range)  verapamil (ISOPTIN) 2.5 MG/ML injection (has no administration in time range)  ticagrelor (BRILINTA) 90 MG tablet (has no administration in time range)  clopidogrel (PLAVIX) 300 MG tablet (has no administration in time range)  cangrelor (KENGREAL) 50 MG SOLR (has no administration in time range)  tirofiban (AGGRASTAT) 5-0.9 MG/100ML-% injection (has no administration in time range)  eptifibatide (INTEGRILIN) 20 MG/10ML injection (has no administration in time range)  verapamil (ISOPTIN) 2.5 MG/ML injection (has no administration in time range)  diltiazem (CARDIZEM) 125 mg in dextrose 5% 125 mL (1 mg/mL) infusion (has no administration in time range)   stroke: mapping our  early stages of recovery book (has no administration in time range)  0.9 %  sodium chloride infusion (has no administration in time range)  acetaminophen (TYLENOL) tablet 650 mg (has no administration in time range)    Or  acetaminophen (TYLENOL) 160 MG/5ML solution 650 mg (has no administration in time range)    Or  acetaminophen (TYLENOL) suppository 650 mg (has no administration in time range)  senna-docusate (Senokot-S) tablet 1 tablet (has no administration in time range)  enoxaparin (LOVENOX) injection 40 mg (has no administration in time range)  iohexol (OMNIPAQUE) 350 MG/ML injection 125 mL (125 mLs Intravenous Contrast Given 09/09/2020 0919)  fentaNYL (SUBLIMAZE) 100 MCG/2ML injection (  Override pull for Anesthesia 08/28/2020 1001)  iohexol (OMNIPAQUE) 240 MG/ML injection (75 mLs  Contrast Given 08/22/2020 1046)  metoprolol tartrate (LOPRESSOR) 5 MG/5ML injection (  Override pull for Anesthesia 09/09/2020 1023)    Vitals:   08/20/2020 0911 09/13/2020 0917 08/28/2020 0941  BP: 114/75 130/86   Pulse:  (!) 131   Resp: 14 19   SpO2:  100% 91%    Final diagnoses:  Acute ischemic left MCA stroke (HCC)    Admission/ observation were discussed with the admitting physician, patient and/or family and they are comfortable with the plan.    Final Clinical Impression(s) / ED Diagnoses Final diagnoses:  Acute ischemic left MCA stroke [facility](HCC)    Rx / DC Orders ED Discharge Orders    None       Melene PlanFloyd, Jahleel Stroschein, DO 08/27/2020 1057

## 2020-09-16 NOTE — ED Notes (Signed)
Care of patient given Alana RN in trauma bay.  Neurology team along with Dr Adela Lank at bedside discussing pt care

## 2020-09-16 NOTE — Progress Notes (Signed)
Pt has ICD, generator change on 3/24. Cannot have MRI for 6 weeks s/p implantation. Dr. Derry Lory aware.

## 2020-09-16 NOTE — Anesthesia Procedure Notes (Signed)
Procedure Name: Intubation Date/Time: 10/10/2020 9:42 AM Performed by: Shireen Quan, CRNA Pre-anesthesia Checklist: Patient identified, Emergency Drugs available, Suction available, Patient being monitored and Timeout performed Patient Re-evaluated:Patient Re-evaluated prior to induction Oxygen Delivery Method: Ambu bag Preoxygenation: Pre-oxygenation with 100% oxygen Induction Type: IV induction, Rapid sequence and Cricoid Pressure applied Ventilation: Mask ventilation without difficulty Laryngoscope Size: Glidescope and 4 Grade View: Grade I Tube type: Subglottic suction tube Tube size: 7.0 mm Number of attempts: 1 Airway Equipment and Method: Stylet and Video-laryngoscopy Placement Confirmation: ETT inserted through vocal cords under direct vision,  breath sounds checked- equal and bilateral and CO2 detector Secured at: 23 cm Tube secured with: Tape Dental Injury: Teeth and Oropharynx as per pre-operative assessment

## 2020-09-17 ENCOUNTER — Inpatient Hospital Stay (HOSPITAL_COMMUNITY): Payer: Medicare Other

## 2020-09-17 ENCOUNTER — Encounter (HOSPITAL_COMMUNITY): Payer: Self-pay | Admitting: Radiology

## 2020-09-17 DIAGNOSIS — I63512 Cerebral infarction due to unspecified occlusion or stenosis of left middle cerebral artery: Secondary | ICD-10-CM | POA: Diagnosis not present

## 2020-09-17 DIAGNOSIS — J811 Chronic pulmonary edema: Secondary | ICD-10-CM

## 2020-09-17 DIAGNOSIS — R570 Cardiogenic shock: Secondary | ICD-10-CM

## 2020-09-17 DIAGNOSIS — J9601 Acute respiratory failure with hypoxia: Secondary | ICD-10-CM

## 2020-09-17 DIAGNOSIS — J810 Acute pulmonary edema: Secondary | ICD-10-CM

## 2020-09-17 LAB — HEMOGLOBIN A1C
Hgb A1c MFr Bld: 6.7 % — ABNORMAL HIGH (ref 4.8–5.6)
Mean Plasma Glucose: 145.59 mg/dL

## 2020-09-17 LAB — BASIC METABOLIC PANEL
Anion gap: 14 (ref 5–15)
BUN: 63 mg/dL — ABNORMAL HIGH (ref 8–23)
CO2: 10 mmol/L — ABNORMAL LOW (ref 22–32)
Calcium: 7.8 mg/dL — ABNORMAL LOW (ref 8.9–10.3)
Chloride: 115 mmol/L — ABNORMAL HIGH (ref 98–111)
Creatinine, Ser: 3.07 mg/dL — ABNORMAL HIGH (ref 0.61–1.24)
GFR, Estimated: 20 mL/min — ABNORMAL LOW (ref >60)
Glucose, Bld: 167 mg/dL — ABNORMAL HIGH (ref 70–99)
Potassium: 5.2 mmol/L — ABNORMAL HIGH (ref 3.5–5.1)
Sodium: 139 mmol/L (ref 135–145)

## 2020-09-17 LAB — GLUCOSE, CAPILLARY
Glucose-Capillary: 114 mg/dL — ABNORMAL HIGH (ref 70–99)
Glucose-Capillary: 101 mg/dL — ABNORMAL HIGH (ref 70–99)
Glucose-Capillary: 115 mg/dL — ABNORMAL HIGH (ref 70–99)
Glucose-Capillary: 145 mg/dL — ABNORMAL HIGH (ref 70–99)
Glucose-Capillary: 197 mg/dL — ABNORMAL HIGH (ref 70–99)
Glucose-Capillary: 209 mg/dL — ABNORMAL HIGH (ref 70–99)

## 2020-09-17 LAB — LIPID PANEL
Cholesterol: 46 mg/dL (ref 0–200)
HDL: 24 mg/dL — ABNORMAL LOW (ref >40)
LDL Cholesterol: 9 mg/dL (ref 0–99)
Total CHOL/HDL Ratio: 1.9 RATIO
Triglycerides: 63 mg/dL (ref <150)
VLDL: 13 mg/dL (ref 0–40)

## 2020-09-17 LAB — COOXEMETRY PANEL
Carboxyhemoglobin: 0.5 % (ref 0.5–1.5)
Methemoglobin: 0.8 % (ref 0.0–1.5)
O2 Saturation: 71.3 %
Total hemoglobin: 13.9 g/dL (ref 12.0–16.0)

## 2020-09-17 LAB — CBC
HCT: 43.9 % (ref 39.0–52.0)
Hemoglobin: 14.4 g/dL (ref 13.0–17.0)
MCH: 30.4 pg (ref 26.0–34.0)
MCHC: 32.8 g/dL (ref 30.0–36.0)
MCV: 92.6 fL (ref 80.0–100.0)
Platelets: 162 10*3/uL (ref 150–400)
RBC: 4.74 MIL/uL (ref 4.22–5.81)
RDW: 14.5 % (ref 11.5–15.5)
WBC: 13.7 10*3/uL — ABNORMAL HIGH (ref 4.0–10.5)
nRBC: 0.1 % (ref 0.0–0.2)

## 2020-09-17 LAB — COMPREHENSIVE METABOLIC PANEL
ALT: 530 U/L — ABNORMAL HIGH (ref 0–44)
AST: 569 U/L — ABNORMAL HIGH (ref 15–41)
Albumin: 2.9 g/dL — ABNORMAL LOW (ref 3.5–5.0)
Alkaline Phosphatase: 67 U/L (ref 38–126)
Anion gap: 8 (ref 5–15)
BUN: 62 mg/dL — ABNORMAL HIGH (ref 8–23)
CO2: 13 mmol/L — ABNORMAL LOW (ref 22–32)
Calcium: 7.9 mg/dL — ABNORMAL LOW (ref 8.9–10.3)
Chloride: 116 mmol/L — ABNORMAL HIGH (ref 98–111)
Creatinine, Ser: 2.31 mg/dL — ABNORMAL HIGH (ref 0.61–1.24)
GFR, Estimated: 29 mL/min — ABNORMAL LOW (ref >60)
Glucose, Bld: 107 mg/dL — ABNORMAL HIGH (ref 70–99)
Potassium: 5 mmol/L (ref 3.5–5.1)
Sodium: 137 mmol/L (ref 135–145)
Total Bilirubin: 1 mg/dL (ref 0.3–1.2)
Total Protein: 5.6 g/dL — ABNORMAL LOW (ref 6.5–8.1)

## 2020-09-17 LAB — BRAIN NATRIURETIC PEPTIDE: B Natriuretic Peptide: 1395 pg/mL — ABNORMAL HIGH (ref 0.0–100.0)

## 2020-09-17 LAB — PHOSPHORUS
Phosphorus: 5.2 mg/dL — ABNORMAL HIGH (ref 2.5–4.6)
Phosphorus: 5.6 mg/dL — ABNORMAL HIGH (ref 2.5–4.6)

## 2020-09-17 LAB — MAGNESIUM
Magnesium: 2 mg/dL (ref 1.7–2.4)
Magnesium: 1.9 mg/dL (ref 1.7–2.4)

## 2020-09-17 LAB — TRIGLYCERIDES: Triglycerides: 62 mg/dL (ref <150)

## 2020-09-17 MED ORDER — PANTOPRAZOLE SODIUM 40 MG PO PACK
40.0000 mg | PACK | Freq: Every day | ORAL | Status: DC
  Administered 2020-09-17 – 2020-09-20 (×4): 40 mg
  Filled 2020-09-17 (×6): qty 20

## 2020-09-17 MED ORDER — PROSOURCE TF PO LIQD
45.0000 mL | Freq: Two times a day (BID) | ORAL | Status: DC
  Administered 2020-09-17: 45 mL
  Filled 2020-09-17: qty 45

## 2020-09-17 MED ORDER — NOREPINEPHRINE 4 MG/250ML-% IV SOLN
2.0000 ug/min | INTRAVENOUS | Status: DC
  Administered 2020-09-17: 10 ug/min via INTRAVENOUS
  Administered 2020-09-17: 3 ug/min via INTRAVENOUS
  Filled 2020-09-17 (×2): qty 250

## 2020-09-17 MED ORDER — FENTANYL CITRATE (PF) 100 MCG/2ML IJ SOLN: 25.0000 ug | INTRAMUSCULAR | Status: DC | PRN

## 2020-09-17 MED ORDER — NOREPINEPHRINE 4 MG/250ML-% IV SOLN
0.0000 ug/min | INTRAVENOUS | Status: DC
  Administered 2020-09-17: 7 ug/min via INTRAVENOUS
  Administered 2020-09-18: 2 ug/min via INTRAVENOUS
  Administered 2020-09-19: 7 ug/min via INTRAVENOUS
  Administered 2020-09-19: 12 ug/min via INTRAVENOUS
  Administered 2020-09-19: 7 ug/min via INTRAVENOUS
  Administered 2020-09-20 – 2020-09-21 (×5): 9 ug/min via INTRAVENOUS
  Administered 2020-09-22 (×2): 10 ug/min via INTRAVENOUS
  Administered 2020-09-22 (×2): 9 ug/min via INTRAVENOUS
  Administered 2020-09-23: 11 ug/min via INTRAVENOUS
  Filled 2020-09-17 (×15): qty 250

## 2020-09-17 MED ORDER — SODIUM ZIRCONIUM CYCLOSILICATE 10 G PO PACK
10.0000 g | PACK | ORAL | Status: AC
  Administered 2020-09-17: 10 g
  Filled 2020-09-17: qty 1

## 2020-09-17 MED ORDER — FUROSEMIDE 10 MG/ML IJ SOLN
40.0000 mg | Freq: Once | INTRAMUSCULAR | Status: AC
  Administered 2020-09-17: 40 mg via INTRAVENOUS
  Filled 2020-09-17: qty 4

## 2020-09-17 MED ORDER — FENTANYL CITRATE (PF) 100 MCG/2ML IJ SOLN
25.0000 ug | INTRAMUSCULAR | Status: DC | PRN
  Administered 2020-09-18 (×2): 25 ug via INTRAVENOUS
  Filled 2020-09-17 (×2): qty 2

## 2020-09-17 MED ORDER — VITAL HIGH PROTEIN PO LIQD
1000.0000 mL | ORAL | Status: DC
  Administered 2020-09-17: 1000 mL

## 2020-09-17 MED ORDER — VITAL AF 1.2 CAL PO LIQD
1000.0000 mL | ORAL | Status: DC
  Administered 2020-09-17 – 2020-09-18 (×2): 1000 mL
  Filled 2020-09-17 (×2): qty 1000

## 2020-09-17 NOTE — Plan of Care (Signed)
  Problem: Education: Goal: Knowledge of disease or condition will improve Outcome: Progressing   

## 2020-09-17 NOTE — Progress Notes (Addendum)
STROKE TEAM PROGRESS NOTE   INTERVAL HISTORY No one is at the bedside.    Hospitalization marked by acute renal failure, moderate subarachnoid hemorrhage within left sylvian fissure, worsening cardiomyopathy.  Atrial fibrillation with rapid ventricular response.Marland Kitchen  He presented with left M2 dominant superior division occlusion and underwent successful mechanical thrombectomy with stent retriever with TICI2b partial revascularization.  With CT scan showing subarachnoid hemorrhage in the left sylvian fissure.  Vitals:   09/17/20 1100 09/17/20 1115 09/17/20 1200 09/17/20 1251  BP: 102/85     Pulse: (!) 112 (!) 116  (!) 129  Resp: (!) 30 (!) 31  (!) 27  Temp:   98.7 F (37.1 C)   TempSrc:   Axillary   SpO2: 96% 93%    Weight:      Height:       CBC:  Recent Labs  Lab 2020-10-06 0840 2020/10/06 0845 06-Oct-2020 1342 09/17/20 0541  WBC 8.5  --   --  13.7*  NEUTROABS 4.8  --   --   --   HGB 14.7   < > 13.9 14.4  HCT 46.0   < > 41.0 43.9  MCV 93.3  --   --  92.6  PLT 154  --   --  162   < > = values in this interval not displayed.   Basic Metabolic Panel:  Recent Labs  Lab 06-Oct-2020 1301 06-Oct-2020 1342 09/17/20 0541 09/17/20 1106  NA 138 140 137  --   K 4.8 4.9 5.0  --   CL 112*  --  116*  --   CO2 18*  --  13*  --   GLUCOSE 233*  --  107*  --   BUN 63*  --  62*  --   CREATININE 1.80*  --  2.31*  --   CALCIUM 8.8*  --  7.9*  --   MG 2.0  --   --  2.0  PHOS  --   --   --  5.2*    Lipid Panel:  Recent Labs  Lab 09/17/20 0541  CHOL 46  TRIG 63  62  HDL 24*  CHOLHDL 1.9  VLDL 13  LDLCALC 9    HgbA1c:  Recent Labs  Lab 09/17/20 0541  HGBA1C 6.7*   Urine Drug Screen: No results for input(s): LABOPIA, COCAINSCRNUR, LABBENZ, AMPHETMU, THCU, LABBARB in the last 168 hours.  Alcohol Level No results for input(s): ETH in the last 168 hours.  IMAGING past 24 hours CT HEAD WO CONTRAST  Result Date: 09/17/2020 CLINICAL DATA:  Intracranial revascularization, altered mental  status EXAM: CT HEAD WITHOUT CONTRAST TECHNIQUE: Contiguous axial images were obtained from the base of the skull through the vertex without intravenous contrast. COMPARISON:  06-Oct-2020 FINDINGS: Brain: Since the prior examination, there has developed small to moderate subarachnoid hemorrhage within the left sylvian fissure. Additionally, there has developed cortical effacement within the left frontal operculum in keeping with an acute cortical infarct. No abnormal mass effect or midline shift. Moderate parenchymal volume loss is again seen, commensurate with the patient's age. Remote lacunar infarct noted within the right thalamus. Moderate periventricular white matter changes are present likely reflecting the sequela of small vessel ischemia. The ventricular size is normal. Cerebellum is unremarkable. Advanced atherosclerotic calcifications are noted within the carotid siphons and distal vertebral arteries bilaterally. Vascular: No asymmetric hyperdense vasculature at the skull base. Residual contrast is seen within the a intravascular space. Skull: The calvarium is intact. Sinuses/Orbits: Left cleft palate  noted. Mucous retention cyst noted within the left maxillary sinus. Mild mucosal thickening noted within the hypoplastic frontal and ethmoid air cells. Tiny air-fluid level noted within the left sphenoid sinus, nonspecific. The orbits are unremarkable. Other: There is fluid opacification of the left mastoid air cells and middle ear cavities. There is layering fluid noted within the posterior nasopharynx. Right mastoid air cell and middle ear cavity are clear. IMPRESSION: Interval development of a small to moderate subarachnoid hemorrhage within the left sylvian fissure. Acute left frontal infarct involving the frontal operculum is seen with no significant associated mass effect or midline shift. Stable senescent changes.  Remote right thalamic infarct. These results will be called to the ordering clinician or  representative by the Radiologist Assistant, and communication documented in the PACS or [employer]. Electronically Signed   By: Helyn Numbers MD   On: 09/17/2020 05:09   DG CHEST PORT 1 VIEW  Result Date: 09/17/2020 CLINICAL DATA:  Central line placement. EXAM: PORTABLE CHEST 1 VIEW COMPARISON:  September 16, 2020. FINDINGS: Stable cardiomegaly. Endotracheal and nasogastric tubes are unchanged in position. Left-sided pacemaker is unchanged in position. Right internal jugular catheter is now noted with distal tip in the right atrium. No pneumothorax is noted. Mild bibasilar subsegmental atelectasis is noted. Small left pleural effusion is noted. Bony thorax is unremarkable. IMPRESSION: Interval placement of right internal jugular catheter with distal tip in the expected position of the right atrium. No pneumothorax is noted. Otherwise stable support apparatus. Electronically Signed   By: Lupita Raider M.D.   On: 09/17/2020 12:28   ECHOCARDIOGRAM COMPLETE  Result Date: 09/07/2020    ECHOCARDIOGRAM REPORT   Patient Name:   AYDIN HINK Date of Exam: 08/31/2020 Medical Rec #:  [id number]    Height:       62.0 in Accession #:    [id number]   Weight:       167.3 lb Date of Birth:  1945/12/21     BSA:          1.772 m Patient Age:    75 years     BP:           101/79 mmHg Patient Gender: M            HR:           101 bpm. Exam Location:  Inpatient Procedure: 2D Echo and Intracardiac Opacification Agent Indications:    Stroke I63.9  History:        Patient has prior history of Echocardiogram examinations, most                 recent 09/06/2020. Defibrillator, COPD, Arrythmias:Atrial                 Fibrillation; Risk Factors:Hypertension, Dyslipidemia and                 Diabetes.  Sonographer:    Thurman Coyer RDCS (AE) Referring Phys: [id number] [facility]  Sonographer Comments: Echo performed with patient supine and on artificial respirator. IMPRESSIONS  1. Left ventricular ejection fraction, by  estimation, is <20%. The left ventricle has severely decreased function. The left ventricle demonstrates global hypokinesis. The left ventricular internal cavity size was severely dilated. Left ventricular diastolic function could not be evaluated.  2. Right ventricular systolic function is severely reduced. The right ventricular size is mildly enlarged. There is severely elevated pulmonary artery systolic pressure. The estimated right ventricular systolic pressure is 62.9 mmHg.  3. Left atrial  size was severely dilated.  4. Right atrial size was severely dilated.  5. The mitral valve is normal in structure. Mild mitral valve regurgitation. No evidence of mitral stenosis.  6. Tricuspid valve regurgitation is moderate.  7. The aortic valve is tricuspid. There is mild calcification of the aortic valve. Aortic valve regurgitation is not visualized. Mild aortic valve sclerosis is present, with no evidence of aortic valve stenosis.  8. The inferior vena cava is dilated in size with <50% respiratory variability, suggesting right atrial pressure of 15 mmHg. Comparison(s): No significant change from prior study. Conclusion(s)/Recommendation(s): No intracardiac source of embolism detected on this transthoracic study. A transesophageal echocardiogram is recommended to exclude cardiac source of embolism if clinically indicated. FINDINGS  Left Ventricle: LV th. Left ventricular ejection fraction, by estimation, is <20%. The left ventricle has severely decreased function. The left ventricle demonstrates global hypokinesis. Definity contrast agent was given IV to delineate the left ventricular endocardial borders. The left ventricular internal cavity size was severely dilated. There is no left ventricular hypertrophy. Left ventricular diastolic function could not be evaluated due to atrial fibrillation. Left ventricular diastolic function could not be evaluated. Right Ventricle: The right ventricular size is mildly enlarged.  Right vetricular wall thickness was not well visualized. Right ventricular systolic function is severely reduced. There is severely elevated pulmonary artery systolic pressure. The tricuspid  regurgitant velocity is 3.46 m/s, and with an assumed right atrial pressure of 15 mmHg, the estimated right ventricular systolic pressure is 62.9 mmHg. Left Atrium: Left atrial size was severely dilated. Right Atrium: Right atrial size was severely dilated. Pericardium: There is no evidence of pericardial effusion. Mitral Valve: The mitral valve is normal in structure. Mild mitral annular calcification. Mild mitral valve regurgitation. No evidence of mitral valve stenosis. Tricuspid Valve: The tricuspid valve is normal in structure. Tricuspid valve regurgitation is moderate. Aortic Valve: The aortic valve is tricuspid. There is mild calcification of the aortic valve. Aortic valve regurgitation is not visualized. Mild aortic valve sclerosis is present, with no evidence of aortic valve stenosis. Pulmonic Valve: The pulmonic valve was grossly normal. Pulmonic valve regurgitation is not visualized. Aorta: The aortic root and ascending aorta are structurally normal, with no evidence of dilitation. Venous: The inferior vena cava is dilated in size with less than 50% respiratory variability, suggesting right atrial pressure of 15 mmHg. IAS/Shunts: The atrial septum is grossly normal. Additional Comments: A device lead is visualized.  LEFT VENTRICLE PLAX 2D LVIDd:         7.10 cm LVIDs:         6.70 cm LV PW:         1.00 cm LV IVS:        1.00 cm LVOT diam:     2.20 cm LVOT Area:     3.80 cm  RIGHT VENTRICLE TAPSE (M-mode): 0.8 cm LEFT ATRIUM              Index       RIGHT ATRIUM           Index LA diam:        4.80 cm  2.71 cm/m  RA Area:     30.00 cm LA Vol (A2C):   134.0 ml 75.61 ml/m RA Volume:   102.00 ml 57.56 ml/m LA Vol (A4C):   101.0 ml 56.99 ml/m LA Biplane Vol: 117.0 ml 66.02 ml/m   AORTA Ao Root diam: 3.30 cm  TRICUSPID VALVE TR Peak grad:   47.9  mmHg TR Vmax:        346.00 cm/s  SHUNTS Systemic Diam: 2.20 cm Jodelle RedBridgette Christopher MD Electronically signed by Jodelle RedBridgette Christopher MD Signature Date/Time: 09/13/2020/8:24:36 PM    Final     PHYSICAL EXAM Frail elderly male who is intubated and sedated.  Not in distress. . Afebrile. Head is nontraumatic. Neck is supple without bruit.    Cardiac exam no murmur or gallop. Lungs are clear to auscultation. Distal pulses are well felt. Neurological Exam : Patient is sedated and intubated.  Eyes are closed.  He follows only occasional commands on the left side but not consistently.  He has left gaze preference.  Doll's eye movements are sluggish.  Pupils equal reactive.  Fundi not visualized.  Dense right hemiplegia but does withdraw right leg a little bit to painful stimuli.  Right upper extremity is hypotonic and flaccid.  He has semipurposeful movement in the left upper and lower extremity at least against gravity. ASSESSMENT/PLAN Mr. Albertha GheeCarlos Norton is a 75 y.o. male with history of  Afib on Xarelto(last dose in AM on 09/15/20), CKD 3, COPD, Dilated Cardiomyopathy, HLD, HTN, ICD, DM2, Vtach who woke up fine at 0500. Last seen by family at 0630 on 09/15/2020. Found down at 0700. Moaning. Immediately called 911 and he was brought in by EMS as a stroke code for R hemiplegia, aphasia, R facial droop.  Workup with CTH with no ICH, loss of grey white differentiation and hypoattenuation in the left parieto-occipital region, likely remote. CT angio concerning for proximal left M2 MCA branch occlusion vs high grade stenosis. He underwent angiogram, showing left M2/MCA occlusion with mechanical thrombectomy and partial recanalization achieved. Pt developd device related dissection with subarachnoid hemorrhage seen.   Given multiplicity of medical conditions (acute rnal failure, SAH, congestive heart failure, cardiomyopathy), need to continue conversation with patient's family  regarding consideration of palliative care.     NIHSS: 24   Stroke:  left sylvian fissure SAH in patient with history of atrial fibrillation on Xarelto.     MRI  : unable to obtain d/t ICD  2D Echo   09/04/2020  1. Left ventricular ejection fraction, by estimation, is <20%. The left       ventricle has severely decreased function. The left ventricle              demonstrates global hypokinesis. The left ventricular internal cavity size was severely  dilated. Left ventricular  diastolic function could not be evaluated.  2.   Left atrial size was severely dilated.  No intracardiac source of embolism  detected on this transthoracic study. A transesophageal echocardiogram is  recommended to exclude cardiac source of embolism if clinically indicated.    LDL 9  HgbA1c 6.7  VTE prophylaxis - none/SAH    Diet   Diet NPO time specified     Xarelto (rivaroxaban) daily prior to admission, now on No antithrombotic.    Therapy recommendations:   NA  Disposition:     Hypertension  Home meds:  lisinopril  Unstable . Permissive hypertension (OK if < 220/120) but gradually normalize in 5-7 days . Long-term BP goal normotensive  Hyperlipidemia  Home meds:  atorvastatin,  resumed in hospital  LDL 9, goal < 70  Continue statin at discharge  Diabetes type II Controlled  Home meds:  8 units insulin at 10pm.   HgbA1c 6.7, goal < 7.0  CBGs Recent Labs    09/17/20 0348 09/17/20 0825 09/17/20 1224  GLUCAP 114* 101* 115*  SSI  Other Stroke Risk Factors   Advanced Age >/= 65    Coronary artery disease   Congestive heart failure      Hospital day # 1 I have personally obtained history,examined this patient, reviewed notes, independently viewed imaging studies, participated in medical decision making and plan of care.ROS completed by me personally and pertinent positives fully documented  I have made any additions or clarifications directly to the above  note. Agree with note above. . Patient has history of atrial fibrillation and anticoagulation was held for pacemaker battery change and had just been started 2 days prior when he developed embolic left M2 occlusion.  Neurological exam suggest aphasia and right hemiplegia and CT scan shows subarachnoid hemorrhage.  Patient has severe cardiomyopathy with ejection fraction less than 5% and remains at significant risk for embolic strokes but anticoagulation will have to be on hold due to subarachnoid hemorrhage post procedure.  His prognosis is very poor given his general medical conditions including renal failure and heart failure and respiratory failure.  Family has agreed to DNR after discussion with critical care team and recommend consulting palliative care team to determine goals of care.  Continue close neurological monitoring and strict blood pressure control with systolic goal 120-140 for 24 hours and then below 160.  No family available at the bedside for discussion.  Discussed with Dr. Merrily Pew critical care medicine  Delia Heady, MD Medical Director [facility] Pager: [phone] 09/17/2020 3:42 PM     To contact Stroke Continuity provider, please refer to [email or web address]. After hours, contact General Neurology

## 2020-09-17 NOTE — Anesthesia Postprocedure Evaluation (Signed)
Anesthesia Post Note  Patient: Michael [name]  Procedure(s) Performed: IR WITH ANESTHESIA (N/A )     Patient location during evaluation: SICU Anesthesia Type: General Level of consciousness: sedated Pain management: pain level controlled Vital Signs Assessment: post-procedure vital signs reviewed and stable Respiratory status: patient remains intubated per anesthesia plan Cardiovascular status: stable Postop Assessment: no apparent nausea or vomiting Anesthetic complications: no   No complications documented.  Last Vitals:  Vitals:   09/17/20 0400 09/17/20 0757  BP:    Pulse:  (!) 120  Resp:  (!) 32  Temp: 37 C   SpO2:  100%    Last Pain:  Vitals:   09/17/20 0400  TempSrc: Oral                 Byrl Latin P Trinity Haun

## 2020-09-17 NOTE — Progress Notes (Signed)
OT Cancellation Note  Patient Details Name: Jeriah Corkum MRN: [id number] DOB: [birth year]   Cancelled Treatment:    Reason Eval/Treat Not Completed: Patient not medically ready Pt sedated, intubated and  Increased HR. Pt with central line being placed. Ot notes critical care note stating family wishes to go comfort. Please d/c OT orders if family wants comfort measures only.   Wynona Neat, OTR/L  Acute Rehabilitation Services Pager: [phone] Office: [phone] .  09/17/2020, 1:33 PM

## 2020-09-17 NOTE — Progress Notes (Signed)
PT Cancellation Note  Patient Details Name: Michael [name] MRN: [id number] DOB: 09/08/1945   Cancelled Treatment:    Reason Eval/Treat Not Completed: Patient not medically ready at this time, pt remains intubated, sedated, and with poorly controlled HR at rest. Pt undergoing central line placement upon checking on pt this afternoon, RN confirmed hold for therapies at this time. Also noted family desire to keep pt comfortable. PT will continue to follow and evaluate as appropriate and if aligned with goals of care.   Rolm Baptise, PT, DPT   Acute Rehabilitation Department Pager #: [phone]   Gaetana Michaelis 09/17/2020, 1:19 PM

## 2020-09-17 NOTE — Progress Notes (Signed)
Pt transported to ct and back with no complications. 

## 2020-09-17 NOTE — Progress Notes (Signed)
   09/17/20 0120  Provider Notification  Provider Name/Title CCM Sommer MD  Date Provider Notified 09/17/20  Time Provider Notified 0120  Notification Type Page  Notification Reason Other (Comment) (Decreased urine output)  Provider response See new orders  Date of Provider Response 09/17/20  Time of Provider Response 0120  New orders for lasix

## 2020-09-17 NOTE — Progress Notes (Addendum)
Progress Note  Patient Name: Michael [name] Date of Encounter: 09/17/2020  [facility] Cardiologist: Dr. Beatrix Fetters  Subjective   intubated sedated  Inpatient Medications    Scheduled Meds: .  stroke: mapping our early stages of recovery book   Does not apply Once  . atorvastatin  20 mg Per Tube Daily  . chlorhexidine gluconate (MEDLINE KIT)  15 mL Mouth Rinse BID  . chlorhexidine gluconate (MEDLINE KIT)  15 mL Mouth Rinse BID  . Chlorhexidine Gluconate Cloth  6 each Topical Q0600  . enoxaparin (LOVENOX) injection  40 mg Subcutaneous Daily  . insulin aspart  0-15 Units Subcutaneous Q4H  . ipratropium-albuterol  3 mL Nebulization Q6H  . mouth rinse  15 mL Mouth Rinse 10 times per day  . mouth rinse  15 mL Mouth Rinse 10 times per day  . pantoprazole (PROTONIX) IV  40 mg Intravenous Daily  . sodium chloride flush  3 mL Intravenous Once   Continuous Infusions: . sodium chloride 75 mL/hr at 09/17/20 0615  . sodium chloride    . amiodarone 30 mg/hr (09/17/20 0600)  . calcium gluconate    . clevidipine    . phenylephrine (NEO-SYNEPHRINE) Adult infusion 13.333 mcg/min (09/17/20 1025)  . propofol (DIPRIVAN) infusion 5 mcg/kg/min (09/17/20 0600)   PRN Meds: acetaminophen **OR** acetaminophen (TYLENOL) oral liquid 160 mg/5 mL **OR** acetaminophen, ondansetron (ZOFRAN) IV, senna-docusate   Vital Signs    Vitals:   09/17/20 0100 09/17/20 0200 09/17/20 0400 09/17/20 0757  BP: 100/87     Pulse: (!) 48 (!) 116  (!) 120  Resp: (!) 24 (!) 28  (!) 32  Temp:   98.6 F (37 C)   TempSrc:   Oral   SpO2: 99% 99%  100%  Weight:      Height:    _0  (1.575 m)    Intake/Output Summary (Last 24 hours) at 09/17/2020 0819 Last data filed at 09/17/2020 0600 Gross per 24 hour  Intake 6240.31 ml  Output 583 ml  Net 5657.31 ml   Last 3 Weights 09/10/2020 09/10/2020 09/09/2020  Weight (lbs) 167 lb 5.3 oz 163 lb 163 lb 14.4 oz  Weight (kg) 75.9 kg 73.936 kg 74.345 kg      Telemetry    Afib  110's-120s - Personally Reviewed  ECG    No new EKGs - Personally Reviewed  Physical Exam   GEN: intubated, sedated.   Neck: flat/supine Cardiac: irregular, tachycardic, no murmurs, rubs, or gallops.  Respiratory: intubated. GI: Soft, non-distended  MS: No edema; No deformity. Neuro:  unable to assess  Psych: unable to assess  Labs    High Sensitivity Troponin:  No results for input(s): TROPONINIHS in the last 720 hours.    Chemistry Recent Labs  Lab 09/14/2020 1007 09/01/2020 1011 08/22/2020 1301 09/15/2020 1342 09/17/20 0541  NA 138   < > 138 140 137  K 5.6*   < > 4.8 4.9 5.0  CL 107  --  112*  --  116*  CO2  --   --  18*  --  13*  GLUCOSE 185*  --  233*  --  107*  BUN 64*  --  63*  --  62*  CREATININE 1.70*  --  1.80*  --  2.31*  CALCIUM  --   --  8.8*  --  7.9*  PROT  --   --  6.0*  --  5.6*  ALBUMIN  --   --  3.3*  --  2.9*  AST  --   --  40  --  569*  ALT  --   --  22  --  530*  ALKPHOS  --   --  75  --  67  BILITOT  --   --  1.5*  --  1.0  GFRNONAA  --   --  39*  --  29*  ANIONGAP  --   --  8  --  8   < > = values in this interval not displayed.     Hematology Recent Labs  Lab 09/08/2020 0840 08/29/2020 0845 09/02/2020 1046 09/13/2020 1342 09/17/20 0541  WBC 8.5  --   --   --  13.7*  RBC 4.93  --   --   --  4.74  HGB 14.7   < > 12.9* 13.9 14.4  HCT 46.0   < > 38.0* 41.0 43.9  MCV 93.3  --   --   --  92.6  MCH 29.8  --   --   --  30.4  MCHC 32.0  --   --   --  32.8  RDW 14.2  --   --   --  14.5  PLT 154  --   --   --  162   < > = values in this interval not displayed.    BNPNo results for input(s): BNP, PROBNP in the last 168 hours.   DDimer No results for input(s): DDIMER in the last 168 hours.   Radiology    CT HEAD WO CONTRAST Result Date: 09/17/2020 CLINICAL DATA:  Intracranial revascularization, altered mental status EXAM: CT HEAD WITHOUT CONTRAST TECHNIQUE: Contiguous axial images were obtained from the base of the skull through the vertex without  intravenous contrast. COMPARISON:  09/07/2020 FINDINGS: Brain: Since the prior examination, there has developed small to moderate subarachnoid hemorrhage within the left sylvian fissure. Additionally, there has developed cortical effacement within the left frontal operculum in keeping with an acute cortical infarct. No abnormal mass effect or midline shift. Moderate parenchymal volume loss is again seen, commensurate with the patient's age. Remote lacunar infarct noted within the right thalamus. Moderate periventricular white matter changes are present likely reflecting the sequela of small vessel ischemia. The ventricular size is normal. Cerebellum is unremarkable. Advanced atherosclerotic calcifications are noted within the carotid siphons and distal vertebral arteries bilaterally. Vascular: No asymmetric hyperdense vasculature at the skull base. Residual contrast is seen within the a intravascular space. Skull: The calvarium is intact. Sinuses/Orbits: Left cleft palate noted. Mucous retention cyst noted within the left maxillary sinus. Mild mucosal thickening noted within the hypoplastic frontal and ethmoid air cells. Tiny air-fluid level noted within the left sphenoid sinus, nonspecific. The orbits are unremarkable. Other: There is fluid opacification of the left mastoid air cells and middle ear cavities. There is layering fluid noted within the posterior nasopharynx. Right mastoid air cell and middle ear cavity are clear. IMPRESSION: Interval development of a small to moderate subarachnoid hemorrhage within the left sylvian fissure. Acute left frontal infarct involving the frontal operculum is seen with no significant associated mass effect or midline shift. Stable senescent changes.  Remote right thalamic infarct. These results will be called to the ordering clinician or representative by the Radiologist Assistant, and communication documented in the PACS or [employer]. Electronically Signed   By: Fidela Salisbury MD   On: 09/17/2020 05:09    Cardiac Studies    09/06/20: TTE IMPRESSIONS  1. Global hypokinesis of LV  with apical akinesis. . Left ventricular  ejection fraction, by estimation, is <20%. The left ventricle has severely  decreased function. The left ventricle demonstrates global hypokinesis.  The left ventricular internal cavity  size was severely dilated. There is mild left ventricular hypertrophy.  Left ventricular diastolic parameters are indeterminate.  2. Right ventricular systolic function is low normal. The right  ventricular size is mildly enlarged. There is normal pulmonary artery  systolic pressure.  3. Mild mitral valve regurgitation.  4. The aortic valve is tricuspid. Aortic valve regurgitation is not  visualized. Mild to moderate aortic valve sclerosis/calcification is  present, without any evidence of aortic stenosis.   Patient Profile     75 y.o. male w/PMHx of advanced NICM s/p BiV ICD, HTN, HLD, CAD, COPD, CKDIIIb and permanent AF was recently hospitalized and was found to be COVID+. 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 His xarelto was held perioperatively given his heightened risk of hematoma/infection. He resumed [facility] 09/15/2020.   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 He had an IR procedure with thrombectomy. He has been intubated for airway protection.   EP consulted for WCT   CT this AM with interval development small-mod small to moderate subarachnoid hemorrhage within the left sylvian fissure. Acute left frontal infarct involving the frontal operculum is seen with no significant associated mass effect or midline shift.  Yesterday started on amiodarone gtt and neo synepherine  Assessment & Plan     1. WCT     EKGs were reviewed with Dr. Quentin Ore and c/w his AF     now on amiodarone gtt      Rates 110-120's-some faster     Also on neo synephrine   2.  Permanent AFib     [place] held recently 2/2 generator change procedure, to have resumed yesterday     CHA2DS2Vasc is 4 >> 6     3. NICM 4. Chronic CHS (systolic) 5. NSVT     Known for him  On neo synephrine amio gtt  Dr. Quentin Ore has seen/examined the patient this mornong No new recommendations from EP perspective  Will defer acute and ongoing  management to his neurologist, CCM teams Might consider palliative consult  Keep K+ and Mag replaced with known hx of NSVT Resume his BP, heart failure meds if/when able Resume a/c when able post intervention as per interventional radiologist and neurology teams    For questions or updates, please contact [place] Please consult [email or web address] for contact info under        Signed, Baldwin [place], PA-C  09/17/2020, 8:19 AM    ----------------------------------------------------------------- I have seen, examined the patient, and reviewed the above assessment and plan.    Agree with above. Critically ill. Agree with palliative care consultation. K>4, Mg>2 given history of ventricular arrhythmias.  Vickie Epley, MD 09/17/2020 9:11 PM

## 2020-09-17 NOTE — Progress Notes (Signed)
SLP Cancellation Note  Patient Details Name: Michael [name] MRN: [id number] DOB: 07-18-1945   Cancelled treatment:       Reason Eval/Treat Not Completed: Patient not medically ready   Jalexia Lalli, Riley Nearing 09/17/2020, 7:56 AM

## 2020-09-17 NOTE — Progress Notes (Signed)
Goals of care  I discussed current clinical situation w/ the patient's daughter MaryHelen. She shares with me that he is a very independent person and would not want to live in SNF, would not want trach and that she above all wants [place] to be sure he is comfortable.   I reviewed his current concerns New stroke w/ hemiplegia and aphasia  Cardiogenic shock w/ multi-organ failure  Plan Cvl placement DNR w/ no escalation above pressors and line NO HD if renal fxn worse If not better over weekend consider wd of care If extubated will be full DNR and DNI  Simonne Martinet ACNP-BC [facility] Pulmonary/Critical Care Pager # [phone] OR # [phone] if no answer

## 2020-09-17 NOTE — Progress Notes (Signed)
eLink Physician-Brief Progress Note Patient Name: Michael [name] DOB: 14-Apr-1946 MRN: [id number]   Date of Service  09/17/2020  HPI/Events of Note  Oliguria - 5 mL/hour. LVEF < 20% with pulmonary HTN. Estimated RVSP = 62.9. CXR from yesterday afternoon with enlarged heart and c/w pulmonary congestion. Creatinine = 1.80.  eICU Interventions  Plan: 1. Lasix 40 mg IV X 1 now.      Intervention Category Major Interventions: Other:  Lenell Antu 09/17/2020, 1:33 AM

## 2020-09-17 NOTE — Progress Notes (Signed)
Referring Physician(s): Code stroke- Erick Blinks (neurology)  Supervising Physician: Baldemar Lenis  Patient Status:  [facility] - In-pt  Chief Complaint: Stroke F/U  Subjective:  History of CVA s/p cerebral arteriogram with emergent mechanical thrombectomy of left MCA M2 occlusion achieving a TICI 2b revascularization via right femoral approach 2020-10-03 by Dr. Tommie Sams. Patient laying in bed intubated with sedation. He opens eyes to voice but quickly shuts them. On multiple pressors. Minimal movements of left side, no movements of right side. Right femoral puncture site c/d/i.  CT head this AM: 1. Interval development of a small to moderate subarachnoid hemorrhage within the left sylvian fissure. Acute left frontal infarct involving the frontal operculum is seen with no significant associated mass effect or midline shift. 2. Stable senescent changes.  Remote right thalamic infarct.   Allergies: Digoxin  Medications: Prior to Admission medications   Medication Sig Start Date End Date Taking? Authorizing Provider  acetaminophen (TYLENOL) 325 MG tablet Take 2 tablets (650 mg total) by mouth every 4 (four) hours as needed for headache. 09/10/20  Yes Tillery, Mariam Dollar, PA-C  atorvastatin (LIPITOR) 20 MG tablet Take 20 mg by mouth daily.   Yes [provider]  budesonide-formoterol (SYMBICORT) 160-4.5 MCG/ACT inhaler Inhale 2 puffs into the lungs 2 (two) times daily. 06/22/20  Yes [provider]  febuxostat (ULORIC) 40 MG tablet Take 40 mg by mouth daily. 06/15/20  Yes [provider]  furosemide (LASIX) 40 MG tablet Take 40 mg by mouth daily.   Yes [provider]  insulin degludec (TRESIBA) 100 UNIT/ML SOPN FlexTouch Pen Inject 8 Units into the skin daily at 10 pm.   Yes [provider]  lisinopril (PRINIVIL,ZESTRIL) 5 MG tablet Take 5 mg by mouth daily.   Yes [provider]  Rivaroxaban  (XARELTO) 15 MG TABS tablet Take 1 tablet (15 mg total) by mouth daily. 09/15/20  Yes Graciella Freer, PA-C  spironolactone (ALDACTONE) 25 MG tablet Take 12.5 mg by mouth daily. 05/31/20  Yes [provider]     Vital Signs: BP 102/85   Pulse (!) 129   Temp 98.7 F (37.1 C) (Axillary)   Resp (!) 27   Ht 5\' 2"  (1.575 m)   Wt 167 lb 5.3 oz (75.9 kg)   SpO2 93%   BMI 30.60 kg/m   Physical Exam Vitals and nursing note reviewed.  Constitutional:      General: He is not in acute distress.    Comments: Intubated with sedation. On multiple pressors.  Pulmonary:     Effort: Pulmonary effort is normal. No respiratory distress.     Comments: Intubated with sedation. Skin:    General: Skin is warm and dry.     Comments: Right femoral puncture site soft without active bleeding or hematoma.  Neurological:     Comments: Intubated with sedation. Opens eyes to voice but quickly shuts them. Minimal movements of left side (wiggles left toes, can lift LUE), no movements of right side. Right popliteal pulse Dopplerable.     Imaging: CT HEAD WO CONTRAST  Result Date: 09/17/2020 CLINICAL DATA:  Intracranial revascularization, altered mental status EXAM: CT HEAD WITHOUT CONTRAST TECHNIQUE: Contiguous axial images were obtained from the base of the skull through the vertex without intravenous contrast. COMPARISON:  October 03, 2020 FINDINGS: Brain: Since the prior examination, there has developed small to moderate subarachnoid hemorrhage within the left sylvian fissure. Additionally, there has developed cortical effacement within the left frontal operculum  in keeping with an acute cortical infarct. No abnormal mass effect or midline shift. Moderate parenchymal volume loss is again seen, commensurate with the patient's age. Remote lacunar infarct noted within the right thalamus. Moderate periventricular white matter changes are present likely reflecting the sequela of small vessel ischemia.  The ventricular size is normal. Cerebellum is unremarkable. Advanced atherosclerotic calcifications are noted within the carotid siphons and distal vertebral arteries bilaterally. Vascular: No asymmetric hyperdense vasculature at the skull base. Residual contrast is seen within the a intravascular space. Skull: The calvarium is intact. Sinuses/Orbits: Left cleft palate noted. Mucous retention cyst noted within the left maxillary sinus. Mild mucosal thickening noted within the hypoplastic frontal and ethmoid air cells. Tiny air-fluid level noted within the left sphenoid sinus, nonspecific. The orbits are unremarkable. Other: There is fluid opacification of the left mastoid air cells and middle ear cavities. There is layering fluid noted within the posterior nasopharynx. Right mastoid air cell and middle ear cavity are clear. IMPRESSION: Interval development of a small to moderate subarachnoid hemorrhage within the left sylvian fissure. Acute left frontal infarct involving the frontal operculum is seen with no significant associated mass effect or midline shift. Stable senescent changes.  Remote right thalamic infarct. These results will be called to the ordering clinician or representative by the Radiologist Assistant, and communication documented in the PACS or [employer]. Electronically Signed   By: Helyn Numbers MD   On: 09/17/2020 05:09   DG CHEST PORT 1 VIEW  Result Date: 09/17/2020 CLINICAL DATA:  Central line placement. EXAM: PORTABLE CHEST 1 VIEW COMPARISON:  09/17/20. FINDINGS: Stable cardiomegaly. Endotracheal and nasogastric tubes are unchanged in position. Left-sided pacemaker is unchanged in position. Right internal jugular catheter is now noted with distal tip in the right atrium. No pneumothorax is noted. Mild bibasilar subsegmental atelectasis is noted. Small left pleural effusion is noted. Bony thorax is unremarkable. IMPRESSION: Interval placement of right internal jugular catheter  with distal tip in the expected position of the right atrium. No pneumothorax is noted. Otherwise stable support apparatus. Electronically Signed   By: Lupita Raider M.D.   On: 09/17/2020 12:28   DG Chest Port 1 View  Result Date: 09-17-20 CLINICAL DATA:  COVID positive.  Abnormal breath sounds. EXAM: PORTABLE CHEST 1 VIEW COMPARISON:  Chest x-ray 09/05/2020. FINDINGS: Endotracheal tube noted with tip 2 cm above the carina. NG tube noted with tip coiled in stomach. Cardiomegaly with mild pulmonary venous congestion. Bilateral interstitial prominence. Interstitial edema and or pneumonitis could present this fashion. Tiny bilateral pleural effusions cannot be excluded. No pneumothorax. AICD in stable position. IMPRESSION: 1.  Endotracheal tube and NG tube noted good anatomic position. 2. AICD in stable position. Cardiomegaly with pulmonary venous congestion. 3. Bilateral interstitial prominence. Interstitial edema and/or pneumonitis could present this fashion. Tiny bilateral pleural effusions cannot be excluded. These findings are new from prior study of 09/05/2020. Electronically Signed   By: Maisie Fus  Register   On: 2020/09/17 15:11   ECHOCARDIOGRAM COMPLETE  Result Date: September 17, 2020    ECHOCARDIOGRAM REPORT   Patient Name:   PILOT PRINDLE Date of Exam: 09/17/20 Medical Rec #:  [id number]    Height:       62.0 in Accession #:    [id number]   Weight:       167.3 lb Date of Birth:  1945-12-26     BSA:          1.772 m Patient Age:    22 years  BP:           101/79 mmHg Patient Gender: M            HR:           101 bpm. Exam Location:  Inpatient Procedure: 2D Echo and Intracardiac Opacification Agent Indications:    Stroke I63.9  History:        Patient has prior history of Echocardiogram examinations, most                 recent 09/06/2020. Defibrillator, COPD, Arrythmias:Atrial                 Fibrillation; Risk Factors:Hypertension, Dyslipidemia and                 Diabetes.  Sonographer:    Thurman Coyerasey  Kirkpatrick RDCS (AE) Referring Phys: [id number] [facility] K[facility]IQ[facility]INA  Sonographer Comments: Echo performed with patient supine and on artificial respirator. IMPRESSIONS  1. Left ventricular ejection fraction, by estimation, is <20%. The left ventricle has severely decreased function. The left ventricle demonstrates global hypokinesis. The left ventricular internal cavity size was severely dilated. Left ventricular diastolic function could not be evaluated.  2. Right ventricular systolic function is severely reduced. The right ventricular size is mildly enlarged. There is severely elevated pulmonary artery systolic pressure. The estimated right ventricular systolic pressure is 62.9 mmHg.  3. Left atrial size was severely dilated.  4. Right atrial size was severely dilated.  5. The mitral valve is normal in structure. Mild mitral valve regurgitation. No evidence of mitral stenosis.  6. Tricuspid valve regurgitation is moderate.  7. The aortic valve is tricuspid. There is mild calcification of the aortic valve. Aortic valve regurgitation is not visualized. Mild aortic valve sclerosis is present, with no evidence of aortic valve stenosis.  8. The inferior vena cava is dilated in size with <50% respiratory variability, suggesting right atrial pressure of 15 mmHg. Comparison(s): No significant change from prior study. Conclusion(s)/Recommendation(s): No intracardiac source of embolism detected on this transthoracic study. A transesophageal echocardiogram is recommended to exclude cardiac source of embolism if clinically indicated. FINDINGS  Left Ventricle: LV th. Left ventricular ejection fraction, by estimation, is <20%. The left ventricle has severely decreased function. The left ventricle demonstrates global hypokinesis. Definity contrast agent was given IV to delineate the left ventricular endocardial borders. The left ventricular internal cavity size was severely dilated. There is no left ventricular hypertrophy. Left  ventricular diastolic function could not be evaluated due to atrial fibrillation. Left ventricular diastolic function could not be evaluated. Right Ventricle: The right ventricular size is mildly enlarged. Right vetricular wall thickness was not well visualized. Right ventricular systolic function is severely reduced. There is severely elevated pulmonary artery systolic pressure. The tricuspid  regurgitant velocity is 3.46 m/s, and with an assumed right atrial pressure of 15 mmHg, the estimated right ventricular systolic pressure is 62.9 mmHg. Left Atrium: Left atrial size was severely dilated. Right Atrium: Right atrial size was severely dilated. Pericardium: There is no evidence of pericardial effusion. Mitral Valve: The mitral valve is normal in structure. Mild mitral annular calcification. Mild mitral valve regurgitation. No evidence of mitral valve stenosis. Tricuspid Valve: The tricuspid valve is normal in structure. Tricuspid valve regurgitation is moderate. Aortic Valve: The aortic valve is tricuspid. There is mild calcification of the aortic valve. Aortic valve regurgitation is not visualized. Mild aortic valve sclerosis is present, with no evidence of aortic valve stenosis. Pulmonic Valve: The pulmonic valve was grossly normal.  Pulmonic valve regurgitation is not visualized. Aorta: The aortic root and ascending aorta are structurally normal, with no evidence of dilitation. Venous: The inferior vena cava is dilated in size with less than 50% respiratory variability, suggesting right atrial pressure of 15 mmHg. IAS/Shunts: The atrial septum is grossly normal. Additional Comments: A device lead is visualized.  LEFT VENTRICLE PLAX 2D LVIDd:         7.10 cm LVIDs:         6.70 cm LV PW:         1.00 cm LV IVS:        1.00 cm LVOT diam:     2.20 cm LVOT Area:     3.80 cm  RIGHT VENTRICLE TAPSE (M-mode): 0.8 cm LEFT ATRIUM              Index       RIGHT ATRIUM           Index LA diam:        4.80 cm  2.71 cm/m   RA Area:     30.00 cm LA Vol (A2C):   134.0 ml 75.61 ml/m RA Volume:   102.00 ml 57.56 ml/m LA Vol (A4C):   101.0 ml 56.99 ml/m LA Biplane Vol: 117.0 ml 66.02 ml/m   AORTA Ao Root diam: 3.30 cm TRICUSPID VALVE TR Peak grad:   47.9 mmHg TR Vmax:        346.00 cm/s  SHUNTS Systemic Diam: 2.20 cm Jodelle Red MD Electronically signed by Jodelle Red MD Signature Date/Time: September 29, 2020/8:24:36 PM    Final    CT HEAD CODE STROKE WO CONTRAST  Result Date: 2020-09-29 CLINICAL DATA:  Code stroke. EXAM: CT HEAD WITHOUT CONTRAST TECHNIQUE: Contiguous axial images were obtained from the base of the skull through the vertex without intravenous contrast. COMPARISON:  None. FINDINGS: Motion limited study. Brain: There is hypoattenuation and loss of gray-white differentiation in the high left parieto-occipital region. There is suggestion of cortical volume loss in this region. No mass effect. No hydrocephalus. No mass lesion. Basal cisterns are patent. No midline shift. Additional patchy white matter hypoattenuation, which is nonspecific but most likely related to chronic microvascular ischemic disease. Vascular: Calcific atherosclerosis. No hyperdense vessel identified. Skull: No acute fracture. Sinuses/Orbits: Retention cyst in the inferior left maxillary sinus with mild ethmoid air cell mucosal thickening.Unremarkable orbits. ASPECTS [facility] Stroke Program Early CT Score) Total score (0-10 with 10 being normal): 10. IMPRESSION: 1. Area of grey-white loss and hypoattenuation in the left parieto-occipital region is age-indeterminant without priors, but favored remote given suggestion of cortical volume loss. MRI could better evaluate for acute infarct if clinically indicated. 2. No acute hemorrhage. Code stroke imaging results were communicated on 2020/09/29 at 8:59 am to provider Dr. Ezzie Dural Via telephone, who verbally acknowledged these results. Electronically Signed   By: Feliberto Harts MD   On:  Sep 29, 2020 09:13   CT ANGIO HEAD CODE STROKE  Result Date: 29-Sep-2020 CLINICAL DATA:  Stroke/TIA, assess extracranial arteries. EXAM: CT ANGIOGRAPHY HEAD AND NECK TECHNIQUE: Multidetector CT imaging of the head and neck was performed using the standard protocol during bolus administration of intravenous contrast. Multiplanar CT image reconstructions and MIPs were obtained to evaluate the vascular anatomy. Carotid stenosis measurements (when applicable) are obtained utilizing NASCET criteria, using the distal internal carotid diameter as the denominator. CONTRAST:  OMNIPAQUE IOHEXOL 350 MG/ML SOLN COMPARISON:  Same day CT head. FINDINGS: CTA NECK FINDINGS Aortic arch: Imaged portion shows no evidence  of aneurysm or dissection. No significant stenosis of the major arch vessel origins. Calcific atherosclerosis. Right carotid system: Mixed calcified and noncalcified atherosclerosis at the carotid bifurcation without greater than 50% stenosis. Left carotid system: Atherosclerosis at the carotid bifurcation without greater than 50% stenosis. Vertebral arteries: Mildly left dominant. Evaluation is limited by streak artifact from contrast in paraspinal veins and streak artifact at the skull base. Within this limitation, no specific evidence greater than 50% stenosis in the neck. Skeleton: Severe multilevel degenerative disc disease with disc height loss, endplate sclerosis and posterior disc osteophyte complexes. Other neck: No mass or suspicious adenopathy. Upper chest: Expect tori imaging with left greater than right ground-glass opacities in the dependent lungs. Review of the MIP images confirms the above findings CTA HEAD FINDINGS Anterior circulation: Predominately calcific atherosclerosis of bilateral cavernous and paraclinoid internal carotid arteries. The calcific nature of plaque limits evaluation of the degree of stenosis with approximately 50% stenosis of bilateral paraclinoid ICAs. Bilateral M1 MCAs  are patent without evidence of hemodynamically significant stenosis. Occlusion versus high-grade stenosis of a proximal left M2 MCA branch (series 11, image 84; series 12, image 89; series 10, image 150). Right proximal M2 MCA branches are patent. Hypoplastic/absent right A1 ACA. Left A1 and bilateral A2 ACAs are patent without evidence of proximal hemodynamically significant stenosis. No aneurysm identified. Posterior circulation: Mild-to-moderate narrowing of bilateral proximal intradural vertebral artery secondary to calcific atherosclerosis. Basilar artery and bilateral posterior cerebral arteries are patent without evidence of hemodynamically significant proximal stenosis. No aneurysm identified. Venous sinuses: As permitted by contrast timing, patent. Review of the MIP images confirms the above findings IMPRESSION: 1. Occlusion versus high-grade stenosis of a proximal left M2 MCA branch (series 11, image 84; series 12, image 89; series 10, image 150). 2. 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 3. Mild to moderate stenosis of bilateral intradural vertebral arteries. 4. Ground-glass opacities in the dependent lungs bilaterally, which may represent atelectasis in the setting of expiratory imaging, but recommend follow-up dedicated chest imaging when the patient is clinically able. Critical findings discussed with Dr. Ezzie Dural at 9:05 a.m. via telephone. Electronically Signed   By: Feliberto Harts MD   On: 08/18/2020 09:38   CT ANGIO NECK CODE STROKE  Result Date: 08/31/2020 CLINICAL DATA:  Stroke/TIA, assess extracranial arteries. EXAM: CT ANGIOGRAPHY HEAD AND NECK TECHNIQUE: Multidetector CT imaging of the head and neck was performed using the standard protocol during bolus administration of intravenous contrast. Multiplanar CT image reconstructions and MIPs were obtained to evaluate the vascular anatomy. Carotid stenosis measurements (when applicable) are obtained utilizing NASCET criteria,  using the distal internal carotid diameter as the denominator. CONTRAST:  OMNIPAQUE IOHEXOL 350 MG/ML SOLN COMPARISON:  Same day CT head. FINDINGS: CTA NECK FINDINGS Aortic arch: Imaged portion shows no evidence of aneurysm or dissection. No significant stenosis of the major arch vessel origins. Calcific atherosclerosis. Right carotid system: Mixed calcified and noncalcified atherosclerosis at the carotid bifurcation without greater than 50% stenosis. Left carotid system: Atherosclerosis at the carotid bifurcation without greater than 50% stenosis. Vertebral arteries: Mildly left dominant. Evaluation is limited by streak artifact from contrast in paraspinal veins and streak artifact at the skull base. Within this limitation, no specific evidence greater than 50% stenosis in the neck. Skeleton: Severe multilevel degenerative disc disease with disc height loss, endplate sclerosis and posterior disc osteophyte complexes. Other neck: No mass or suspicious adenopathy. Upper chest: Expect tori imaging with left greater than right ground-glass opacities in the  dependent lungs. Review of the MIP images confirms the above findings CTA HEAD FINDINGS Anterior circulation: Predominately calcific atherosclerosis of bilateral cavernous and paraclinoid internal carotid arteries. The calcific nature of plaque limits evaluation of the degree of stenosis with approximately 50% stenosis of bilateral paraclinoid ICAs. Bilateral M1 MCAs are patent without evidence of hemodynamically significant stenosis. Occlusion versus high-grade stenosis of a proximal left M2 MCA branch (series 11, image 84; series 12, image 89; series 10, image 150). Right proximal M2 MCA branches are patent. Hypoplastic/absent right A1 ACA. Left A1 and bilateral A2 ACAs are patent without evidence of proximal hemodynamically significant stenosis. No aneurysm identified. Posterior circulation: Mild-to-moderate narrowing of bilateral proximal intradural  vertebral artery secondary to calcific atherosclerosis. Basilar artery and bilateral posterior cerebral arteries are patent without evidence of hemodynamically significant proximal stenosis. No aneurysm identified. Venous sinuses: As permitted by contrast timing, patent. Review of the MIP images confirms the above findings IMPRESSION: 1. Occlusion versus high-grade stenosis of a proximal left M2 MCA branch (series 11, image 84; series 12, image 89; series 10, image 150). 2. 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 3. Mild to moderate stenosis of bilateral intradural vertebral arteries. 4. Ground-glass opacities in the dependent lungs bilaterally, which may represent atelectasis in the setting of expiratory imaging, but recommend follow-up dedicated chest imaging when the patient is clinically able. Critical findings discussed with Dr. Ezzie Dural at 9:05 a.m. via telephone. Electronically Signed   By: Feliberto Harts MD   On: 09-18-20 09:38    Labs:  CBC: Recent Labs    09/05/20 1703 09-18-2020 0840 September 18, 2020 0845 September 18, 2020 1011 09-18-2020 1046 2020/09/18 1342 09/17/20 0541  WBC 6.1 8.5  --   --   --   --  13.7*  HGB 14.3 14.7   < > 15.6 12.9* 13.9 14.4  HCT 43.8 46.0   < > 46.0 38.0* 41.0 43.9  PLT 166 154  --   --   --   --  162   < > = values in this interval not displayed.    COAGS: Recent Labs    09/05/20 1703 09/07/20 1939 09/08/20 0244 09/08/20 1120  INR 2.7*  --   --   --   APTT  --  126* 107* 94*    BMP: Recent Labs    09/09/20 0221 09/10/20 0254 2020/09/18 0845 09/18/2020 1007 Sep 18, 2020 1011 09/18/20 1046 September 18, 2020 1301 09/18/2020 1342 09/17/20 0541  NA 132* 131* 136 138   < > 141 138 140 137  K 4.5 4.1 6.8* 5.6*   < > 4.5 4.8 4.9 5.0  CL 102 105 112* 107  --   --  112*  --  116*  CO2 21* 19*  --   --   --   --  18*  --  13*  GLUCOSE 122* 136* 125* 185*  --   --  233*  --  107*  BUN 76* 65* 102* 64*  --   --  63*  --  62*  CALCIUM 8.6* 8.1*  --   --   --   --   8.8*  --  7.9*  CREATININE 2.16* 1.88* 1.70* 1.70*  --   --  1.80*  --  2.31*  GFRNONAA 31* 37*  --   --   --   --  39*  --  29*   < > = values in this interval not displayed.    LIVER FUNCTION TESTS: Recent Labs    09/05/20  1703 October 09, 2020 1301 09/17/20 0541  BILITOT 1.5* 1.5* 1.0  AST 26 40 569*  ALT 23 22 530*  ALKPHOS 78 75 67  PROT 6.5 6.0* 5.6*  ALBUMIN 3.2* 3.3* 2.9*    Assessment and Plan:  History of CVA s/p cerebral arteriogram with emergent mechanical thrombectomy of left MCA M2 occlusion achieving a TICI 2b revascularization via right femoral approach 2020-10-09 by Dr. Tommie Sams. Patient's condition stable- remains intubated/sedated but opens eyes to voice, minimal movements of left side, no movements of right side. Right femoral puncture site stable, right popliteal pulse Dopplerable. Further plans per neurology/CCM/cardiology- appreciate and agree with management. Please call NIR with questions/concerns.   Electronically Signed: Elwin Mocha, PA-C 09/17/2020, 3:25 PM   I spent a total of 15 Minutes at the the patient's bedside AND on the patient's hospital floor or unit, greater than 50% of which was counseling/coordinating care for CVA s/p revascularization.

## 2020-09-17 NOTE — Progress Notes (Addendum)
Initial Nutrition Assessment  DOCUMENTATION CODES:   Not applicable  INTERVENTION:   Initiate tube feeding via OG tube: Vital AF 1.2 at 65 ml/h (1560 ml per day)  Provides 1872 kcal, 117 gm protein, 1265 ml free water daily  NUTRITION DIAGNOSIS:   Inadequate oral intake related to inability to eat as evidenced by NPO status.  GOAL:   Patient will meet greater than or equal to 90% of their needs  MONITOR:   Vent status,TF tolerance,Labs  REASON FOR ASSESSMENT:   Ventilator,Consult Enteral/tube feeding initiation and management  ASSESSMENT:   75 yo male admitted with acute L MCA stroke, S/P thrombectomy with hemorrhagic conversion and SAH. PMH includes HF, chronic A fib with RVR, CKD, COPD, dilated cardiomyopathy, CAD, HLD, HTN, DM-2, ICD.   Received MD Consult for TF initiation and management. OG tube in place.   MD discussed GOC with daughter, plans for DNR status, no escalation of care, no HD. May withdraw support if no improvement over the weekend.   Patient is currently intubated on ventilator support MV: 16.5 L/min Temp (24hrs), Avg:98.3 F (36.8 C), Min:97.4 F (36.3 C), Max:98.9 F (37.2 C)  Propofol: off   Labs reviewed. Phos 5.2 CBG: 101-115  Medications reviewed and include Novolog, Levophed, neosynephrine.  No recent weights available for review.  NUTRITION - FOCUSED PHYSICAL EXAM:  Flowsheet Row Most Recent Value  Orbital Region No depletion  Upper Arm Region No depletion  Thoracic and Lumbar Region No depletion  Buccal Region No depletion  Temple Region Mild depletion  Clavicle Bone Region No depletion  Clavicle and Acromion Bone Region No depletion  Scapular Bone Region Unable to assess  Dorsal Hand No depletion  Patellar Region Mild depletion  Anterior Thigh Region Mild depletion  Posterior Calf Region Mild depletion  Edema (RD Assessment) Mild  Hair Reviewed  Eyes Unable to assess  Mouth Unable to assess  Skin Reviewed  Nails  Reviewed       Diet Order:   Diet Order            Diet NPO time specified  Diet effective now                 EDUCATION NEEDS:   Not appropriate for education at this time  Skin:  Skin Assessment: Skin Integrity Issues: Skin Integrity Issues:: Incisions Incisions: groin  Last BM:  no BM recommended  Height:   Ht Readings from Last 1 Encounters:  09/17/20 5\' 2"  (1.575 m)    Weight:   Wt Readings from Last 1 Encounters:  11-Oct-2020 75.9 kg    Ideal Body Weight:  53.6 kg  BMI:  Body mass index is 30.6 kg/m.  Estimated Nutritional Needs:   Kcal:  1835  Protein:  100-115 gm  Fluid:  >/= 1.8 L    09/18/20, RD, LDN, CNSC Please refer to Amion for contact information.

## 2020-09-17 NOTE — Progress Notes (Addendum)
NAME:  Michael [name], MRN:  [id number], DOB:  Apr 24, 1946, LOS: 1 ADMISSION DATE:  08/25/2020, CONSULTATION DATE:  3/31 REFERRING MD:  Stroke team Khaliqdina , CHIEF COMPLAINT:  Stroke, respiratory failure   History of Present Illness:  75 year old male w/ PMH as per below. Admitted w/ new right sided weakness and incoherent speech at 0630, LSN 0600 walking around in house. On arrival room air sats 60s.  Initial NIHS 24, had L gaze preference. CT and CTA obtained. Finding concerning for proximal left MCA Stroke. 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 3/31 admitted w/ new left MCA stroke right sided hemiparesis right sided facial droop and aphasia. Went emergently to IR, not IV TPA candidate due to xarelto. Wide complex tachycardia w/ PVCs while in CT. Cardiology consulted by EDP. Intubated for airway protection, Left Radial aline Placed by anesthesia. Underwent  mechanical thrombectomy and partial recannulization small device related dissection w/ SAH noted. Returned to ICU on vasopressin w/ goal BP 120-160. GCS 3 but sedated. Having runs of VT so amio bolus and infusion initiated.  ECHO Left ventricular ejection fraction, by estimation, is <20%. The LV severely dilated. SVR elevated PAP. RV size enlarged. RA size large.  ventricle has severely decreased function. Could not get MRI due to ICD.  Marland Kitchen 4/1 decreased UOP, got lasix. CT head showed interval small to [facility] w/in sylvian fissure and acute L frontal infarct no mass effect. Still in shock. Looking cardiogenic. Changing to norepi. Will wake up but has dense right sided hemiparesis. Continuing lasix. New shock liver.  Renal fxn worse. Asked Palliative to see. Made DNR. No HD   Interim History / Subjective:   will respond but still pressor dependent  Objective   Blood pressure 100/87, pulse (Abnormal) 120, temperature 98.6 F (37 C), temperature source Oral, resp. rate (Abnormal) 32, height 5\' 2"  (1.575 m), weight 75.9 kg, SpO2 100 %.    Vent Mode: PRVC FiO2 (%):  [40 %-50 %] 40 % Set Rate:  [14 bmp-16 bmp] 14 bmp Vt Set:  [430 mL-440 mL] 430 mL PEEP:  [5 cmH20] 5 cmH20 Plateau Pressure:  [11 cmH20-13 cmH20] 13 cmH20   Intake/Output Summary (Last 24 hours) at 09/17/2020 0841 Last data filed at 09/17/2020 0600 Gross per 24 hour  Intake 6240.31 ml  Output 583 ml  Net 5657.31 ml   Filed Weights   09/05/2020 1230  Weight: 75.9 kg    Examination: General this is a 75 year old male currently still on vent support HENT NCAT orally intubated Pulm coarse scattered rhonchi. Can pull 500-649ml VT on PSV but ability to protect airway is a concern Card tachy irreg w/ afib abd soft Ext cool. Weak pulses Neuro opens eyes. Moves the left side. Hemiparetic on the right. Purposeful PERRL GU dec UOP via fc   Labs/imaging that I havepersonally reviewed  (right click and "Reselect all SmartList Selections" daily)  See below   Resolved Hospital Problem list     Assessment & Plan:   Acute left MCA stroke suspect COVID related but also could be after being off [facility] for his ICD procedure.  now s/p neuro-interventional for mechanical thrombectomy and partial recannulization small device related dissection w/ SAH noted w/ some increase in size on post-op day 1 and new left frontal infarct.  -he presented sedated on prop GCS 3 Plan Cont serial neuro checks PAD protocol RASS goal -0 BP goal 120-160 F/u carotid US Hold statin given inc LFTs Holding systemic AC  Acute hypoxic respiratory failure  -PCXR personally reviewed from 3/31 ett and ICD unremarkable. Diffuse patchy airspace disease. Looks c/w edema  -abg  reviewed Plan Cont full vent support PAD protocol RASS goal -0 VAP bundle Am cxr  Chronic afib w/ RVR and freq PVCs and runs of VT. Has known h/o HFrEF and dilated CM (EF 20-25%), CAD, HTN and HL -rate in 110-120s -seen by EP w/ new new recs Has ICD Plan Cont amio for rate control (will accept HR as long as less than 130) Con tele Lasix IV x 1 for now (checking daily chem to ensure K>4 and Mg >2) Hold spiro as K 5  Cardiogenic shock EF now < 20% Plan Changing neo to norepi  Consider CVL placement if family still wants aggressive care. CVP and SCVO2 would be helpful  Cont IV lasix Holding bb and additional afterload reduction d/t shock state  Shock liver.  Suspect 2/2 decreased perfusion and possibly hepatic congestion from HF Plan Trend will get LFT in am No further titration of amio for now may need to stop if LFTs cont to rise  Acute on chronic renal failure; CKD stage 3  -looks like his creatinine (typically 1.8 to 2) now c/b decreased end-organ perfusion  Plan Avoid hypotension Renal dose meds.  Strict I&O Am chem   Fluid & Electrolyte imbalance w/ hyperkalemia, hyperchloremia,  NAG metabolic acidosis  Plan Lokelma today and lasix  Add bicarb VT Dc NaCl Change chem to q12  COVID -19 positive. This was asymptomatic and present back on 3/20 from admit on 3/20 Plan Nothing to do   DM type II Plan ssi   Best Practice (right click and "Reselect all SmartList Selections" daily)   Diet:  Tube Feed  If oral type of diet:  Pain/Anxiety/Delirium protocol (if indicated): Yes (RASS goal 0) VAP protocol (if indicated): Yes DVT prophylaxis: SCD GI prophylaxis: PPI Glucose control:  SSI Yes Central venous access:  N/A Arterial line:  Yes, and it is still needed Foley:  Yes, and it is still needed Restraints ACTION; IS/IS NOT: is not Mobility:  bed rest  PT consulted: N/A Blood pressure target: has Target goal if applicable: 120-160 Studies pending: carotid  US Culture data pending: sputum  Last reviewed culture data:today Antibiotics:none currently  Antibiotic de-escalation:  not indicated  Stop date: NA Daily labs: requested Code Status:  DNR Prognosis: Life-threating VERY POOR PROGNOSIS  Last date of multidisciplinary goals of care discussion [pending ]  Critical care time: 32 min     Simonne Martinet ACNP-BC [facility] Pulmonary/Critical Care Pager # [phone] OR # [phone] if no answer

## 2020-09-17 NOTE — Procedures (Signed)
Central Venous Catheter Insertion Procedure Note  Michael [name]  [id number]  [birth year]  Date:09/17/20  Time:11:30 AM   Provider Performing:Pete Bea Laura Tanja Port   Procedure: Insertion of Non-tunneled Central Venous [phone]) with US guidance ([id number])   Indication(s) Medication administration and Difficult access    Consent Risks of the procedure as well as the alternatives and risks of each were explained to the patient and/or caregiver.  Consent for the procedure was obtained and is signed in the bedside chart      Anesthesia Topical only with 1% lidocaine   Timeout Verified patient identification, verified procedure, site/side was marked, verified correct patient position, special equipment/implants available, medications/allergies/relevant history reviewed, required imaging and test results available.  Sterile Technique Maximal sterile technique including full sterile barrier drape, hand hygiene, sterile gown, sterile gloves, mask, hair covering, sterile ultrasound probe cover (if used).  Procedure Description Area of catheter insertion was cleaned with chlorhexidine and draped in sterile fashion.  With real-time ultrasound guidance a central venous catheter was placed into the left internal jugular vein. Nonpulsatile blood flow and easy flushing noted in all ports.  The catheter was sutured in place and sterile dressing applied.  Complications/Tolerance None; patient tolerated the procedure well. Chest X-ray is ordered to verify placement for internal jugular or subclavian cannulation.   Chest x-ray is not ordered for femoral cannulation.  EBL Minimal  Specimen(s) None Simonne Martinet ACNP-BC [facility] Pulmonary/Critical Care Pager # [phone] OR # [phone] if no answer

## 2020-09-17 DEATH — deceased

## 2020-09-18 ENCOUNTER — Inpatient Hospital Stay (HOSPITAL_COMMUNITY): Payer: Medicare Other

## 2020-09-18 DIAGNOSIS — J9601 Acute respiratory failure with hypoxia: Secondary | ICD-10-CM | POA: Diagnosis not present

## 2020-09-18 DIAGNOSIS — N1832 Chronic kidney disease, stage 3b: Secondary | ICD-10-CM

## 2020-09-18 DIAGNOSIS — I63512 Cerebral infarction due to unspecified occlusion or stenosis of left middle cerebral artery: Secondary | ICD-10-CM | POA: Diagnosis not present

## 2020-09-18 DIAGNOSIS — R6521 Severe sepsis with septic shock: Secondary | ICD-10-CM

## 2020-09-18 DIAGNOSIS — N179 Acute kidney failure, unspecified: Secondary | ICD-10-CM

## 2020-09-18 DIAGNOSIS — R570 Cardiogenic shock: Secondary | ICD-10-CM | POA: Diagnosis not present

## 2020-09-18 DIAGNOSIS — N183 Chronic kidney disease, stage 3 unspecified: Secondary | ICD-10-CM

## 2020-09-18 DIAGNOSIS — J810 Acute pulmonary edema: Secondary | ICD-10-CM | POA: Diagnosis not present

## 2020-09-18 DIAGNOSIS — Z515 Encounter for palliative care: Secondary | ICD-10-CM

## 2020-09-18 DIAGNOSIS — A419 Sepsis, unspecified organism: Secondary | ICD-10-CM

## 2020-09-18 LAB — CBC
HCT: 39.2 % (ref 39.0–52.0)
Hemoglobin: 12.4 g/dL — ABNORMAL LOW (ref 13.0–17.0)
MCH: 29.2 pg (ref 26.0–34.0)
MCHC: 31.6 g/dL (ref 30.0–36.0)
MCV: 92.5 fL (ref 80.0–100.0)
Platelets: 104 10*3/uL — ABNORMAL LOW (ref 150–400)
RBC: 4.24 MIL/uL (ref 4.22–5.81)
RDW: 14.8 % (ref 11.5–15.5)
WBC: 12.4 10*3/uL — ABNORMAL HIGH (ref 4.0–10.5)
nRBC: 0 % (ref 0.0–0.2)

## 2020-09-18 LAB — POCT I-STAT EG7
Acid-base deficit: 10 mmol/L — ABNORMAL HIGH (ref 0.0–2.0)
Bicarbonate: 14.2 mmol/L — ABNORMAL LOW (ref 20.0–28.0)
Calcium, Ion: 1.14 mmol/L — ABNORMAL LOW (ref 1.15–1.40)
HCT: 40 % (ref 39.0–52.0)
Hemoglobin: 13.6 g/dL (ref 13.0–17.0)
O2 Saturation: 49 %
Patient temperature: 98.6
Potassium: 4.5 mmol/L (ref 3.5–5.1)
Sodium: 143 mmol/L (ref 135–145)
TCO2: 15 mmol/L — ABNORMAL LOW (ref 22–32)
pCO2, Ven: 27.6 mmHg — ABNORMAL LOW (ref 44.0–60.0)
pH, Ven: 7.32 (ref 7.250–7.430)
pO2, Ven: 28 mmHg — CL (ref 32.0–45.0)

## 2020-09-18 LAB — BASIC METABOLIC PANEL
Anion gap: 11 (ref 5–15)
Anion gap: 14 (ref 5–15)
BUN: 76 mg/dL — ABNORMAL HIGH (ref 8–23)
BUN: 91 mg/dL — ABNORMAL HIGH (ref 8–23)
CO2: 10 mmol/L — ABNORMAL LOW (ref 22–32)
CO2: 12 mmol/L — ABNORMAL LOW (ref 22–32)
Calcium: 7.7 mg/dL — ABNORMAL LOW (ref 8.9–10.3)
Calcium: 7.7 mg/dL — ABNORMAL LOW (ref 8.9–10.3)
Chloride: 113 mmol/L — ABNORMAL HIGH (ref 98–111)
Chloride: 114 mmol/L — ABNORMAL HIGH (ref 98–111)
Creatinine, Ser: 3.66 mg/dL — ABNORMAL HIGH (ref 0.61–1.24)
Creatinine, Ser: 4.08 mg/dL — ABNORMAL HIGH (ref 0.61–1.24)
GFR, Estimated: 17 mL/min — ABNORMAL LOW (ref >60)
GFR, Estimated: 15 mL/min — ABNORMAL LOW (ref >60)
Glucose, Bld: 257 mg/dL — ABNORMAL HIGH (ref 70–99)
Glucose, Bld: 267 mg/dL — ABNORMAL HIGH (ref 70–99)
Potassium: 4.7 mmol/L (ref 3.5–5.1)
Potassium: 4.4 mmol/L (ref 3.5–5.1)
Sodium: 134 mmol/L — ABNORMAL LOW (ref 135–145)
Sodium: 140 mmol/L (ref 135–145)

## 2020-09-18 LAB — GLUCOSE, CAPILLARY
Glucose-Capillary: 182 mg/dL — ABNORMAL HIGH (ref 70–99)
Glucose-Capillary: 211 mg/dL — ABNORMAL HIGH (ref 70–99)
Glucose-Capillary: 226 mg/dL — ABNORMAL HIGH (ref 70–99)
Glucose-Capillary: 239 mg/dL — ABNORMAL HIGH (ref 70–99)
Glucose-Capillary: 243 mg/dL — ABNORMAL HIGH (ref 70–99)
Glucose-Capillary: 261 mg/dL — ABNORMAL HIGH (ref 70–99)

## 2020-09-18 LAB — CULTURE, RESPIRATORY W GRAM STAIN: Culture: NORMAL

## 2020-09-18 LAB — HEPATIC FUNCTION PANEL
ALT: 2834 U/L — ABNORMAL HIGH (ref 0–44)
AST: 4269 U/L — ABNORMAL HIGH (ref 15–41)
Albumin: 2.9 g/dL — ABNORMAL LOW (ref 3.5–5.0)
Alkaline Phosphatase: 66 U/L (ref 38–126)
Bilirubin, Direct: 0.6 mg/dL — ABNORMAL HIGH (ref 0.0–0.2)
Indirect Bilirubin: 0.9 mg/dL (ref 0.3–0.9)
Total Bilirubin: 1.5 mg/dL — ABNORMAL HIGH (ref 0.3–1.2)
Total Protein: 5.5 g/dL — ABNORMAL LOW (ref 6.5–8.1)

## 2020-09-18 LAB — PHOSPHORUS
Phosphorus: 5.7 mg/dL — ABNORMAL HIGH (ref 2.5–4.6)
Phosphorus: 5.1 mg/dL — ABNORMAL HIGH (ref 2.5–4.6)

## 2020-09-18 LAB — MAGNESIUM
Magnesium: 2 mg/dL (ref 1.7–2.4)
Magnesium: 1.9 mg/dL (ref 1.7–2.4)

## 2020-09-18 LAB — BRAIN NATRIURETIC PEPTIDE: B Natriuretic Peptide: 1603.8 pg/mL — ABNORMAL HIGH (ref 0.0–100.0)

## 2020-09-18 MED ORDER — FENTANYL BOLUS VIA INFUSION: 30.0000 ug | INTRAVENOUS | Status: DC | PRN

## 2020-09-18 MED ORDER — INSULIN GLARGINE 100 UNIT/ML ~~LOC~~ SOLN
15.0000 [IU] | Freq: Every day | SUBCUTANEOUS | Status: DC
  Administered 2020-09-18: 15 [IU] via SUBCUTANEOUS
  Filled 2020-09-18 (×2): qty 0.15

## 2020-09-18 MED ORDER — SODIUM CHLORIDE 0.9 % IV SOLN
2.0000 g | INTRAVENOUS | Status: DC
  Administered 2020-09-18 – 2020-09-23 (×6): 2 g via INTRAVENOUS
  Filled 2020-09-18 (×2): qty 20
  Filled 2020-09-18 (×2): qty 2
  Filled 2020-09-18: qty 20
  Filled 2020-09-18 (×3): qty 2

## 2020-09-18 MED ORDER — FENTANYL BOLUS VIA INFUSION
30.0000 ug | INTRAVENOUS | Status: DC | PRN
  Administered 2020-09-18: 30 ug via INTRAVENOUS
  Administered 2020-09-18: 40 ug via INTRAVENOUS
  Administered 2020-09-18: 30 ug via INTRAVENOUS
  Filled 2020-09-18: qty 100

## 2020-09-18 MED ORDER — SODIUM BICARBONATE 8.4 % IV SOLN
100.0000 meq | Freq: Once | INTRAVENOUS | Status: AC
  Administered 2020-09-18: 100 meq via INTRAVENOUS
  Filled 2020-09-18: qty 100

## 2020-09-18 MED ORDER — FUROSEMIDE 10 MG/ML IJ SOLN
8.0000 mg/h | INTRAVENOUS | Status: DC
  Administered 2020-09-18 – 2020-09-21 (×4): 8 mg/h via INTRAVENOUS
  Filled 2020-09-18 (×5): qty 20

## 2020-09-18 MED ORDER — LORAZEPAM 2 MG/ML IJ SOLN
0.5000 mg | INTRAMUSCULAR | Status: DC | PRN
  Administered 2020-09-18: 0.5 mg via INTRAVENOUS
  Filled 2020-09-18: qty 1

## 2020-09-18 MED ORDER — VITAL AF 1.2 CAL PO LIQD
1000.0000 mL | ORAL | Status: DC
  Administered 2020-09-18 – 2020-09-21 (×3): 1000 mL
  Filled 2020-09-18: qty 1000

## 2020-09-18 MED ORDER — FENTANYL 2500MCG IN NS 250ML (10MCG/ML) PREMIX INFUSION
20.0000 ug/h | INTRAVENOUS | Status: DC
  Administered 2020-09-18: 20 ug/h via INTRAVENOUS
  Filled 2020-09-18: qty 250

## 2020-09-18 NOTE — Progress Notes (Signed)
PT Cancellation Note  Patient Details Name: Michael [name] MRN: [id number] DOB: Nov 24, 1945   Cancelled Treatment:    Reason Eval/Treat Not Completed: Medical issues which prohibited therapy. Pt continues to be intubated and following minimal commands with his HR elevating significantly with any agitation. Also noted chart reports family desire to keep pt comfortable. PT will continue to follow and evaluate as appropriate and if aligned with goals of care. Please d/c PT eval orders if family decides to pursue comfort care.   Raymond Gurney, PT, DPT Acute Rehabilitation Services  Pager: [phone] Office: [phone]   Jewel Baize 09/18/2020, 8:35 AM

## 2020-09-18 NOTE — Progress Notes (Signed)
Mixed venous O2 sat at 50 not particularly illuminating; I suspect that what we have is a combination of septic shock and cardiogenic shock  I discussed the case with CHF.  They raised the possibility about cardioversion given the poorly controlled atrial fibrillation.  I think in light of the absence of anticoagulation and the recent stroke that that approach is fraught with peril.  Hence, would attend to trying to control the sepsis and reassess over the next 24 hours to see what happens with heart failure, renal failure liver failure

## 2020-09-18 NOTE — Progress Notes (Addendum)
STROKE TEAM PROGRESS NOTE   INTERVAL HISTORY No one is at the bedside.    He presented with left M2 dominant superior division occlusion and underwent successful mechanical thrombectomy with stent retriever with TICI2b partial revascularization.  CT scan showing subarachnoid hemorrhage in the left sylvian fissure and new left frontal infarct.  Hospital course has been complicated by atrial fibrillation with rapid ventricular response, cardiogenic shock with EF now <20%, and acute renal failure.  Creatinine continues to climb with decreased urine output.  Lasix gtt was started on today.  Remains on low-dose vasopressors.  Liver function continues to worsen. Patient was transitioned to DNR today.     Vitals:   09/18/20 0945 09/18/20 1000 09/18/20 1015 09/18/20 1030  BP:  108/71    Pulse:  (!) 25 (!) 25   Resp: 18 (!) 24 (!) 24 (!) 31  Temp:      TempSrc:      SpO2:  97% 100%   Weight:      Height:       CBC:  Recent Labs  Lab September 26, 2020 0840 September 26, 2020 0845 09/17/20 0541 09/18/20 0500  WBC 8.5  --  13.7* 12.4*  NEUTROABS 4.8  --   --   --   HGB 14.7   < > 14.4 12.4*  HCT 46.0   < > 43.9 39.2  MCV 93.3  --  92.6 92.5  PLT 154  --  162 104*   < > = values in this interval not displayed.   Basic Metabolic Panel:  Recent Labs  Lab 09/17/20 1720 09/18/20 0500  NA 139 134*  K 5.2* 4.7  CL 115* 113*  CO2 10* 10*  GLUCOSE 167* 257*  BUN 63* 76*  CREATININE 3.07* 3.66*  CALCIUM 7.8* 7.7*  MG 1.9 2.0  PHOS 5.6* 5.7*    Lipid Panel:  Recent Labs  Lab 09/17/20 0541  CHOL 46  TRIG 63  62  HDL 24*  CHOLHDL 1.9  VLDL 13  LDLCALC 9    HgbA1c:  Recent Labs  Lab 09/17/20 0541  HGBA1C 6.7*   Urine Drug Screen: No results for input(s): LABOPIA, COCAINSCRNUR, LABBENZ, AMPHETMU, THCU, LABBARB in the last 168 hours.  Alcohol Level No results for input(s): ETH in the last 168 hours.  IMAGING  CT head:  Result date: 09/17/2020 IMPRESSION: Interval development of a small to  moderate subarachnoid hemorrhage within the left sylvian fissure. Acute left frontal infarct involving the frontal operculum is seen with no significant associated mass effect or midline shift.  Stable senescent changes.  Remote right thalamic infarct.  DG Chest Port 1 View Result Date: 09/18/2020 IMPRESSION: 1. Minimal left base/retrocardiac density unchanged and may be due to atelectasis or infection. 2.  Tubes and lines unchanged.   DG CHEST PORT 1 VIEW Result Date: 09/17/2020 IMPRESSION: Interval placement of right internal jugular catheter with distal tip in the expected position of the right atrium. No pneumothorax is noted. Otherwise stable support apparatus.  CT head Result date: 2020-09-26 IMPRESSION: 1. Area of grey-white loss and hypoattenuation in the left parieto-occipital region is age-indeterminant without priors, but favored remote given suggestion of cortical volume loss.  2. No acute hemorrhage.  CT angio head/neck Result date: 09-26-2020 IMPRESSION: 1. Occlusion versus high-grade stenosis of a proximal left M2 MCA branch (series 11, image 84; series 12, image 89; series 10, image 150). 2. 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 3. Mild to moderate stenosis of bilateral intradural vertebral arteries. 4.  Ground-glass opacities in the dependent lungs bilaterally  2020/09/20 Echo 1. Left ventricular ejection fraction, by estimation, is <20%. The left  ventricle has severely decreased function. The left ventricle demonstrates  global hypokinesis. The left ventricular internal cavity size was severely  dilated. Left ventricular  diastolic function could not be evaluated.  2. Right ventricular systolic function is severely reduced. The right  ventricular size is mildly enlarged. There is severely elevated pulmonary  artery systolic pressure. The estimated right ventricular systolic  pressure is 62.9 mmHg.  3. Left atrial size was severely dilated.   4. Right atrial size was severely dilated.  5. The mitral valve is normal in structure. Mild mitral valve  regurgitation. No evidence of mitral stenosis.  6. Tricuspid valve regurgitation is moderate.  7. The aortic valve is tricuspid. There is mild calcification of the  aortic valve. Aortic valve regurgitation is not visualized. Mild aortic  valve sclerosis is present, with no evidence of aortic valve stenosis.  8. The inferior vena cava is dilated in size with <50% respiratory  variability, suggesting right atrial pressure of 15 mmHg  PHYSICAL EXAM General: Elderly male, lying in the bed, orally intubated HENT: orally intubated, ETT and OGT in place Pulm: bilateral fine crackles, no rhonchi or wheezes Card: tachy irreg w/ afib, no murmur abd: soft, nontender, nondistended, bowel sounds present Ext: cool. Weak pulses  Neurological Exam : Patient is sedated and intubated.  Opens eyes briefly.  He follows only occasional commands on the left side but not consistently.  He has left gaze preference.  Blinks to threat on the left only. Pupils equal reactive.  Fundi not visualized.  Right leg weak withdrawal to painful stimuli.  Right upper extremity drift.  He has semipurposeful movement in the left upper and lower extremity at least against gravity. + babinski RLE  ASSESSMENT/PLAN Mr. Jamis Kryder is a 75 y.o. male with history of  Afib on Xarelto(last dose in AM on 09/15/20), CKD 3, COPD, dilated cardiomyopathy, HLD, HTN, ICD, DM2, Vtach who woke up fine at 0500. Last seen by family at 0630 on 20-Sep-2020. Found down at 0700. Moaning. Immediately called 911 and he was brought in by EMS as a stroke code for R hemiplegia, aphasia, R facial droop.  Workup with CTH with no ICH, loss of grey white differentiation and hypoattenuation in the left parieto-occipital region, likely remote. CT angio concerning for proximal left M2 MCA branch occlusion vs high grade stenosis. He underwent angiogram,  showing left M2/MCA occlusion with mechanical thrombectomy and partial recanalization achieved. Pt developd device related dissection with subarachnoid hemorrhage seen.   Given multiplicity of medical conditions (acute rnal failure, SAH, congestive heart failure, cardiomyopathy), need to continue conversation with patient's family regarding consideration of palliative care.    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 Severe cardiomyopathy vs. COVID related hypercoagulable state   Code stroke CT head: Area of grey-white loss and hypoattenuation in the left parieto-occipital region   CTA head/neck: Occlusion versus high-grade stenosis of a proximal left M2 MCA branch  MRI  : unable to obtain d/t ICD  S/p IR with TICI 2b reperfusion  CT head (09/17/2020): subarachnoid hemorrhage within the left sylvian fissure. Acute left frontal infarct involving the frontal operculum  2D Echo   September 20, 2020: EF <20%, left ventricle has severely decreased function, global hypokinesis. Left atrial size was severely dilated.  No intracardiac source of embolism detected   LDL 9  HgbA1c 6.7  VTE prophylaxis - SCDs  Diet: NPO  Xarelto (rivaroxaban) daily prior to admission, now on No antithrombotic due to [facility].    Therapy recommendations:  pending  Disposition: pt critically ill, with multiorgan failure, palliative care on board, family is in discussion  Left sylvian fissure SAH  SBP goal <160 to prevent further hemorrhage  Hold chemical DVT prophylaxis for now  Serial neuro checks  Sodium goal >135  BP goal < 160  Chronic afib w/ RVR   Was on amiodarone gtt for rate control  Cardiology on board, amiodarone d/c due to liver failure  Holding off AC in the setting of SAH  Heart failure  Cardiogenic shock  Echo: EF <20%, left ventricle has severely decreased function, left ventricle demonstrates global  hypokinesis  Cardiology consult today appreciate recs  Started on IV Lasix infusion 8mg /hr in the setting of cardiogenic shock, shock liver, and worsening renal failure  Respiratory Failure Septic shock  Due to acute stroke, acute pulmonary edema, and gram-positive cocci of pneumonia  Full vent support  Sputum culture (3/31): gram-positive cocci   Ceftriaxone 2g q 24hrs  WBC 13.7->12.4  CCM managing  Acute on chronic renal failure; CKD stage 3 due to ATN from cardiorenal syndrome  Creatinine level 1.7->2.31->3.07->3.66   minimal urine output  Lasix infusion started this am  Continue to monitor uop  Liver failure   AST/ALT 4269/2834  Amiodarone discontinued  CCM on board  COVID infection   3/31 PCR + for COVID  Respiratory failure with intubation  Supportive care  Hypertension history hypotension  Home meds:  lisinopril  Currently hypotensive requiring low-dose levophed  SBP goal <160 in the setting of SAH  MAP goal >65 . Long-term BP goal normotensive  Hyperlipidemia  Home meds:  atorvastatin,  resumed in hospital  LDL 9, goal < 70  No statin now due to extremely low LDL and liver failure  Diabetes type II Controlled  Home meds:  8 units insulin at 10pm  On Lantus 15 units daily  HgbA1c 6.7, goal < 7.0  Currently hyperglycemia  CBGs  SSI  Other Stroke Risk Factors   Advanced Age >/= 21    Coronary artery disease   Congestive heart failure   Lissy Olivencia-Simmons, ACNP Stroke Nurse Practitioner 09/18/2020   ATTENDING NOTE: I reviewed above note and agree with the assessment and plan. Pt was seen and examined.   75 year old male with history of A. fib on Xarelto, CKD, severe cardiomyopathy with ICD placement, CKD, hypertension, hyperlipidemia, diabetes, V. tach admitted for unresponsive, found down, aphasia and right hemiplegia and right facial droop.  CT head concerning for left parietal occipital infarct.  CTA head and  neck showed left M2 occlusion.  Patient status post thrombectomy with TICI 2B reperfusion.  Post op CT showed right superficial SAH.  EF less than 20%, LDL 9, A1c 6.7.  Patient labs also showing renal failure with creatinine 2.31-3.07-3.66.  Also showed shock liver or liver failure with significant elevated AST/ALT.  Patient also showed hypotension with leukocytosis.  CCM and cardiology on board concerning for combination of septic shock and cardiogenic shock.  Currently on Levophed, Lasix drip, Rocephin.  Patient also has hyperglycemia, on Lantus.  Etiology for patient stroke likely due to severe cardiomyopathy with low EF, A. fib off AC for ICD placement, or Covid related hypercoagulable state.  However, patient currently critically ill with multiorgan failure, prognosis likely poor.  Palliative care on board, discussing with family regarding GOC.  Family currently requested DNR and they are in discussion of further decisions.  For detailed assessment and plan, please refer to above as I have made changes wherever appropriate.   Marvel Plan, MD PhD Stroke Neurology 09/18/2020 8:59 PM  This patient is critically ill due to left MCA stroke, status post thrombectomy, SAH, respiratory failure, liver failure, renal failure, heart failure and at significant risk of neurological worsening, death form recurrent stroke, hemorrhagic conversion, seizure, multiorgan failure. This patient's care requires constant monitoring of vital signs, hemodynamics, respiratory and cardiac monitoring, review of multiple databases, neurological assessment, discussion with family, other specialists and medical decision making of high complexity. I spent 45 minutes of neurocritical care time in the care of this patient.  I discussed with CCM Dr. Merrily Pew.   To contact Stroke Continuity provider, please refer to [email or web address]. After hours, contact General Neurology

## 2020-09-18 NOTE — Consult Note (Signed)
Consultation Note Date: 09/18/2020   Patient Name: Michael [name]  DOB: [birth year]  MRN: [id number]  Age / Sex: 75 y.o., male  PCP: Lowella Dandy, NP Referring Physician: Rosalin Hawking, MD  Reason for Consultation: Establishing goals of care  HPI/Patient Profile: 75 y.o. male  with past medical history of atrial fibrillation, ventricular tachycardia, DM2, CHF EF 20-25%, CKD 3, who was admitted on 08/27/2020 with right hemiplegia and facial droop.  He was diagnosed with a large left MCA CVA.  He underwent thrombectomy with stent.  On 4/1 imaging revealed a new sub arachnoid hemorrhage and another stroke.  His course has been complicated with afib w/RVR and cardiogenic shock.  At the time of this consultation Mr. Greulich is on the vent but has increased work of breathing.  He is tachycardic 130s - 140s.  His transaminases are in the thousands, and his creatinine has risen to 3.66.  He is on pressors to maintain his blood pressure.   Clinical Assessment and Goals of Care:  I have reviewed medical records including EPIC notes, labs and imaging, received report from [facility] the ICU RN, examined the patient and spoken with his daughter Ryden Wainer on the phone  to discuss diagnosis prognosis, [place], EOL wishes, disposition and options.  I introduced Palliative Medicine as specialized medical care for people living with serious illness. It focuses on providing relief from the symptoms and stress of a serious illness.  We discussed a brief life review of the patient.  Rahkeem Senft is originally from [place].  He married an [place] woman and they had 6 children together.  Unfortunately his wife died 56 years ago.  He wears a large tattoo of her on his right arm.  I asked what makes Lyonel Morejon?  Cleora Fleet immediately said "He loves his church, he is a man of God".  She commented that he helps feed the homeless even when he may have  difficulty himself.  Cleora Fleet lives in [place] but his other 5 children live in [place].  Cleora Fleet tells met that she is his Scientist, research (medical).  She also pays his bills for him.  She explains that his other children have difficulty with drugs and are unable to help her with decision making.  Mr. Overturf adopted many of his grandchildren as his children were unable to care for them.  His grandson Glenard Haring who lives with him is schizophrenic - he cares for [employer]  As far as functional and nutritional status prior to admission Mr. Craze was completely independent and walked an hour every day for exercise.  Despite his heart health history Mr. Nicholl pressed on and was a healthy man.  We discussed his current illness and what it means in the larger context of his on-going co-morbidities.  Natural disease trajectory and expectations at EOL were discussed.  I attempted to elicit values and goals of care important to the patient.  Per Cleora Fleet, Mr. Bostwick had said "If it gets real bad, let me go".  I assured Cleora Fleet that it is really bad.  We discussed the [facility] and new stroke that Cleora Fleet said she did not know about.  We also discussed his renal failure and liver failure.  Cleora Fleet asked - if she did liberate him from the ventilator would he die quickly?  I told her everyone was different but her father is on so much artificial support that he would likely not live long.  Multiple times during our conversation Cleora Fleet expressed concern over "making the wrong decision".  I attempted to provide reassurance, and asked "even if we continue the vent and other measures for now can we at least attempt to make him more comfortable?"  Cleora Fleet immediately gave permission and said yes, please try to make him comfortable.  Cleora Fleet will speak with some of her family members (grand children) and plan to come to bedside tomorrow to see her father and speak with me.  Questions and concerns were addressed.  The  family was encouraged to call with questions or concerns.   Primary Decision Maker:  NEXT OF KIN Liberty Handy, daughter    SUMMARY OF RECOMMENDATIONS   Continue current measures Family requests that while they are deliberating over whether or not to move forward with a compassionate extubation, please keep him as comfortable as possible. Low dose fentanyl gtt started with PRN boluses For pain and dyspnea Low dose ativan started (Patient may be better served by a precedex gtt) for anxiety and agitation. Plan to meet with Liberty Handy at bedside tomorrow (Or Monday if she is unable to get an automobile tomorrow)  Code Status/Advance Care Planning:  DNR/DNI   Symptom Management:   As above.  Additional Recommendations (Limitations, Scope, Preferences):  Full Scope Treatment  Palliative Prophylaxis:   Frequent Pain Assessment, Frequent assessment for increased work of breathing.  Psycho-social/Spiritual:   Desire for further Chaplaincy support:  Welcomed  Prognosis:  Hours to days;    Discharge Planning: Anticipated Hospital Death      Primary Diagnoses: Present on Admission: . Acute ischemic left MCA stroke ([place])   I have reviewed the medical record, interviewed the patient and family, and examined the patient. The following aspects are pertinent.  Past Medical History:  Diagnosis Date  . Arthritis   . Cataract   . Chronic atrial fibrillation ([place])   . Chronic kidney disease, stage 3 unspecified ([place])   . Chronic pain   . Chronic systolic heart failure (HCC)    LVEF 20 to 25%  . COPD (chronic obstructive pulmonary disease) ([place])   . Current use of long term anticoagulation   . Dilated cardiomyopathy ([place])    Mild CAD at cardiac catheterization 2020  . Hyperlipidemia   . Hypertension   . Presence of automatic implantable cardioverter-defibrillator    Medtronic biventricular ICD  . Type 2 diabetes mellitus ([place])   . Ventricular tachycardia (HCC)     Social History   Socioeconomic History  . Marital status: Widowed    Spouse name: Not on file  . Number of children: Not on file  . Years of education: Not on file  . Highest education level: Not on file  Occupational History  . Occupation: retired  Tobacco Use  . Smoking status: Former Smoker    Types: Cigarettes  . Smokeless tobacco: Never Used  Vaping Use  . Vaping Use: Never used  Substance and Sexual Activity  . Alcohol use: No  . Drug use: No  .  Sexual activity: Not on file  Other Topics Concern  . Not on file  Social History Narrative   Lives with grandson. Does not drive.    Social Determinants of Health   Financial Resource Strain: Not on file  Food Insecurity: Not on file  Transportation Needs: Not on file  Physical Activity: Not on file  Stress: Not on file  Social Connections: Not on file   Family History  Problem Relation Age of Onset  . Heart disease Father     Allergies  Allergen Reactions  . Digoxin Shortness Of Breath      Vital Signs: BP 113/89   Pulse (!) 139   Temp 98 F (36.7 C) (Axillary)   Resp (!) 26   Ht 5' 2"  (1.575 m)   Wt 86.2 kg   SpO2 94%   BMI 34.76 kg/m  Pain Scale: CPOT       SpO2: SpO2: 94 % O2 Device:SpO2: 94 % O2 Flow Rate: .O2 Flow Rate (L/min): 0 L/min    Palliative Assessment/Data: 10%     Time In: 4:00 Time Out: 5:00 Time Total: 60 mn Visit consisted of counseling and education dealing with the complex and emotionally intense issues surrounding the need for palliative care and symptom management in the setting of serious and potentially life-threatening illness. Greater than 50%  of this time was spent counseling and coordinating care related to the above assessment and plan.  Signed by: Florentina Jenny, PA-C Palliative Medicine  Please contact Palliative Medicine Team phone at [phone] for questions and concerns.  For individual provider: See Shea Evans

## 2020-09-18 NOTE — Progress Notes (Signed)
Progress Note  Patient Name: Michael [name] Date of Encounter: 09/18/2020  [facility] Cardiologist: Dr. Beatrix Fetters  Patient Profile     75 y.o. male w/PMHx of advanced NICM s/p BiV ICD, HTN, HLD, CAD, COPD, CKDIIIb and permanent AF was recently hospitalized and was found to be COVID+. On 3/24, patient underwent generator replacement for his ICD given it was EOS as of 5/21  His xarelto was held perioperatively given his heightened risk of hematoma/infection. He resumed [facility] 09/15/2020.   He presents 08/28/2020 with aphasia and was found to have an acute L MCA stroke. He had an IR procedure with thrombectomy. He has been intubated for airway protection.   EP consulted for [facility]   CT >>  interval development small-mod small to moderate subarachnoid hemorrhage within the left sylvian fissure. Acute left frontal infarct involving the frontal operculum is seen with no significant associated mass effect or midline shift.  Echo EF < 20%   Yesterday started on amiodarone gtt and neo synepherine, worsening renal function; liver function  Started on IV lasix not urinating much to speak of    Subjective   Unresponsive  Inpatient Medications    Scheduled Meds: .  stroke: mapping our early stages of recovery book   Does not apply Once  . chlorhexidine gluconate (MEDLINE KIT)  15 mL Mouth Rinse BID  . Chlorhexidine Gluconate Cloth  6 each Topical Q0600  . insulin aspart  0-15 Units Subcutaneous Q4H  . insulin glargine  15 Units Subcutaneous Daily  . ipratropium-albuterol  3 mL Nebulization Q6H  . mouth rinse  15 mL Mouth Rinse 10 times per day  . pantoprazole sodium  40 mg Per Tube Q1200  . sodium chloride flush  3 mL Intravenous Once   Continuous Infusions: . sodium chloride Stopped (09/18/20 1039)  . amiodarone 30 mg/hr (09/18/20 1100)  . calcium gluconate    . cefTRIAXone (ROCEPHIN)  IV Stopped (09/18/20 1011)  . feeding supplement (VITAL AF 1.2 CAL) 1,000 mL (09/18/20 0923)  .  furosemide (LASIX) 200 mg in dextrose 5% 100 mL (65m/mL) infusion 8 mg/hr (09/18/20 1100)  . norepinephrine (LEVOPHED) Adult infusion 8 mcg/min (09/18/20 1159)   PRN Meds: acetaminophen **OR** acetaminophen (TYLENOL) oral liquid 160 mg/5 mL **OR** acetaminophen, fentaNYL (SUBLIMAZE) injection, fentaNYL (SUBLIMAZE) injection, ondansetron (ZOFRAN) IV, senna-docusate   Vital Signs    Vitals:   09/18/20 1110 09/18/20 1115 09/18/20 1130 09/18/20 1145  BP: 139/66  115/83 116/83  Pulse: (!) 135 (!) 25  (!) 29  Resp: (!) 27 (!) 25 (!) 21 (!) 27  Temp:      TempSrc:      SpO2: 100% 97%  95%  Weight:      Height:        Intake/Output Summary (Last 24 hours) at 09/18/2020 1240 Last data filed at 09/18/2020 1200 Gross per 24 hour  Intake 3132.33 ml  Output 110 ml  Net 3022.33 ml   Last 3 Weights 09/18/2020 09/07/2020 09/10/2020  Weight (lbs) 190 lb 0.6 oz 167 lb 5.3 oz 163 lb  Weight (kg) 86.2 kg 75.9 kg 73.936 kg      Telemetry    A. fib 110s-140s- Personally Reviewed  ECG       Physical Exam   Well developed and nourished intubated HENT normal Neck supple JVP by CVP about 11 Good air movement laterally Irregularly irregular rate and rhythm Abd-soft  No Clubbing cyanosis edema Skin-warm and dry A & Oriented  Grossly normal sensory and  motor function     Labs    High Sensitivity Troponin:  No results for input(s): TROPONINIHS in the last 720 hours.    Chemistry Recent Labs  Lab 09/03/2020 1301 08/19/2020 1342 09/17/20 0541 09/17/20 1720 09/18/20 0500  NA 138   < > 137 139 134*  K 4.8   < > 5.0 5.2* 4.7  CL 112*  --  116* 115* 113*  CO2 18*  --  13* 10* 10*  GLUCOSE 233*  --  107* 167* 257*  BUN 63*  --  62* 63* 76*  CREATININE 1.80*  --  2.31* 3.07* 3.66*  CALCIUM 8.8*  --  7.9* 7.8* 7.7*  PROT 6.0*  --  5.6*  --  5.5*  ALBUMIN 3.3*  --  2.9*  --  2.9*  AST 40  --  569*  --  4,269*  ALT 22  --  530*  --  2,834*  ALKPHOS 75  --  67  --  66  BILITOT 1.5*  --  1.0   --  1.5*  GFRNONAA 39*  --  29* 20* 17*  ANIONGAP 8  --  8 14 11    < > = values in this interval not displayed.     Hematology Recent Labs  Lab 08/25/2020 0840 09/14/2020 0845 09/01/2020 1342 09/17/20 0541 09/18/20 0500  WBC 8.5  --   --  13.7* 12.4*  RBC 4.93  --   --  4.74 4.24  HGB 14.7   < > 13.9 14.4 12.4*  HCT 46.0   < > 41.0 43.9 39.2  MCV 93.3  --   --  92.6 92.5  MCH 29.8  --   --  30.4 29.2  MCHC 32.0  --   --  32.8 31.6  RDW 14.2  --   --  14.5 14.8  PLT 154  --   --  162 104*   < > = values in this interval not displayed.    BNP Recent Labs  Lab 09/17/20 1106 09/18/20 0500  BNP 1,395.0* 1,603.8*     DDimer No results for input(s): DDIMER in the last 168 hours.   Radiology    CT HEAD WO CONTRAST Result Date: 09/17/2020 CLINICAL DATA:  Intracranial revascularization, altered mental status EXAM: CT HEAD WITHOUT CONTRAST TECHNIQUE: Contiguous axial images were obtained from the base of the skull through the vertex without intravenous contrast. COMPARISON:  08/17/2020 FINDINGS: Brain: Since the prior examination, there has developed small to moderate subarachnoid hemorrhage within the left sylvian fissure. Additionally, there has developed cortical effacement within the left frontal operculum in keeping with an acute cortical infarct. No abnormal mass effect or midline shift. Moderate parenchymal volume loss is again seen, commensurate with the patient's age. Remote lacunar infarct noted within the right thalamus. Moderate periventricular white matter changes are present likely reflecting the sequela of small vessel ischemia. The ventricular size is normal. Cerebellum is unremarkable. Advanced atherosclerotic calcifications are noted within the carotid siphons and distal vertebral arteries bilaterally. Vascular: No asymmetric hyperdense vasculature at the skull base. Residual contrast is seen within the a intravascular space. Skull: The calvarium is intact. Sinuses/Orbits: Left  cleft palate noted. Mucous retention cyst noted within the left maxillary sinus. Mild mucosal thickening noted within the hypoplastic frontal and ethmoid air cells. Tiny air-fluid level noted within the left sphenoid sinus, nonspecific. The orbits are unremarkable. Other: There is fluid opacification of the left mastoid air cells and middle ear cavities. There is layering  fluid noted within the posterior nasopharynx. Right mastoid air cell and middle ear cavity are clear. IMPRESSION: Interval development of a small to moderate subarachnoid hemorrhage within the left sylvian fissure. Acute left frontal infarct involving the frontal operculum is seen with no significant associated mass effect or midline shift. Stable senescent changes.  Remote right thalamic infarct. These results will be called to the ordering clinician or representative by the Radiologist Assistant, and communication documented in the PACS or [employer]. Electronically Signed   By: Fidela Salisbury MD   On: 09/17/2020 05:09    Cardiac Studies    09/06/20: TTE IMPRESSIONS  1. Global hypokinesis of LV with apical akinesis. . Left ventricular  ejection fraction, by estimation, is <20%. The left ventricle has severely  decreased function. The left ventricle demonstrates global hypokinesis.  The left ventricular internal cavity  size was severely dilated. There is mild left ventricular hypertrophy.  Left ventricular diastolic parameters are indeterminate.  2. Right ventricular systolic function is low normal. The right  ventricular size is mildly enlarged. There is normal pulmonary artery  systolic pressure.  3. Mild mitral valve regurgitation.  4. The aortic valve is tricuspid. Aortic valve regurgitation is not  visualized. Mild to moderate aortic valve sclerosis/calcification is  present, without any evidence of aortic stenosis.     Assessment & Plan     WCT Afib with RVR   Permanent AFib  NICM  Chronic CHF  sys  NSVT  Shock on  neo synephrine Characterized by acute renal failure with oliguria and shock liver    There is a discordance between hemodynamic parameters with renal failure shock liver elevated CVP on the 1 hand which would be consistent with cardiogenic shock and is being warm to touch even in his lower extremities suggesting septic shock.  I have asked nursing to get a an MVO2 that may give [place] some clue.  His atrial fibrillation is in part secondary.  The fulminating liver failure makes it hard to continue IV amiodarone though I suspect it is related to the shock and not the amiodarone.  We will stop it.  That will leave [place] very few options for controlling his heart rate, dig being precluded by worsening renal failure and shock precluding either calcium blockers or beta-blockers.  Unfortunately this may be the beginning of a perfect storm.  I have spoken with CCM.  He is a DNR.  I do not think he would survive resuscitation of a cardiac arrest.     Virl Axe, MD 09/18/2020 12:40 PM

## 2020-09-18 NOTE — Progress Notes (Signed)
NAME:  Michael [name], MRN:  [id number], DOB:  12-06-1945, LOS: 2 ADMISSION DATE:  09/11/2020, CONSULTATION DATE:  3/31 REFERRING MD:  Stroke team Khaliqdina , CHIEF COMPLAINT:  Stroke, respiratory failure   History of Present Illness:  75 year old male w/ PMH as per below. Admitted w/ new right sided weakness and incoherent speech at 0630, LSN 0600 walking around in house. On arrival room air sats 60s.  Initial NIHS 24, had L gaze preference. CT and CTA obtained. Finding concerning for proximal left MCA Stroke. 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 3/31 admitted w/ new left MCA stroke right sided hemiparesis right sided facial droop and aphasia. Went emergently to IR, not IV TPA candidate due to xarelto. Wide complex tachycardia w/ PVCs while in CT. Cardiology consulted by EDP. Intubated for airway protection, Left Radial aline Placed by anesthesia. Underwent  mechanical thrombectomy and partial recannulization small device related dissection w/ SAH noted. Returned to ICU on vasopressin w/ goal BP 120-160. GCS 3 but sedated. Having runs of VT so amio bolus and infusion initiated.  ECHO Left ventricular ejection fraction, by estimation, is <20%. The LV severely dilated. SVR elevated PAP. RV size enlarged. RA size large.  ventricle has severely decreased function. Could not get MRI due to ICD.  Marland Kitchen 4/1 decreased UOP, got lasix. CT head showed interval small to [facility] w/in sylvian fissure and acute L frontal infarct no mass effect. Still in shock. Looking cardiogenic. Changing to norepi. Will wake up but has dense right sided hemiparesis. Continuing lasix. New shock liver.  Renal fxn worse. Asked Palliative to see. Made DNR. No HD   Interim History / Subjective:  Remains on low-dose vasopressors, kidney liver function continued to get worse  Objective   Blood pressure 101/70, pulse (!) 145, temperature 98.5 F (36.9 C), temperature source Oral, resp. rate (!) 30, height 5\' 2"  (1.575 m), weight 86.2 kg, SpO2 100 %. CVP:  [7 mmHg-21 mmHg] 8 mmHg  Vent Mode: PRVC FiO2 (%):  [40 %] 40 % Set Rate:  [14 bmp] 14 bmp Vt Set:  [430 mL] 430 mL PEEP:  [5 cmH20] 5 cmH20 Pressure Support:  [5 cmH20] 5 cmH20 Plateau Pressure:  [13 cmH20-17 cmH20] 13 cmH20   Intake/Output Summary (Last 24 hours) at 09/18/2020 1032 Last data filed at 09/18/2020 0700 Gross per 24 hour  Intake 3217.53 ml  Output 75 ml  Net 3142.53 ml   Filed Weights   09/11/2020 1230 09/18/20 0500  Weight: 75.9 kg 86.2 kg    Examination: General: Elderly male, lying in the bed, orally intubated HENT NCAT orally intubated, ETT and OGT in place Pulm bilateral fine crackles, no rhonchi or wheezes Card tachy irreg w/ afib, no murmur abd soft, nontender, nondistended, bowel sounds present Ext cool. Weak pulses Neuro opens eyes. Moves the left side. Hemiparetic on the right. Purposeful PERRL GU dec UOP via fc   Labs/imaging that I havepersonally reviewed  (right click and "Reselect all SmartList Selections" daily)  Serum creatinine 3.66, serum bicarbonate 10, AST > 4K, ALT 2.8K  Resolved Hospital Problem list     Assessment &  Plan:   Acute left MCA stroke suspect COVID related but also could be after being off [facility] for his ICD procedure.  now s/p mechanical thrombectomy and partial recannulization small device related dissection w/ SAH noted w/ some increase in size on post-op day 1 and new left frontal infarct.   Cont serial neuro checks every hour PAD protocol RASS goal -0 BP goal 120-160 Holding statin due to worsening LFTs Holding systemic AC due to subarachnoid hemorrhage  Acute hypoxic  respiratory failure  Due to acute stroke and acute pulmonary edema and gram-positive cocci of pneumonia Cont full vent support PAD protocol RASS goal -0 VAP bundle Started on ceftriaxone  Chronic afib w/ RVR and freq PVCs and runs of VT.  Patient's heart rate is not well controlled Cont amio for rate control (will accept HR as long as less than 130) Con tele Can to start beta-blocker/calcium channel blocker due to cardiogenic shock and requirement of vasopressors  Cardiogenic shock EF now < 20% Shock liver Continue vasopressor support with Levophed Started on IV Lasix infusion Hold spiro as K 5 Holding bb and additional afterload reduction d/t shock state LFTs continue to trend up due to hepatic congestion and shock liver Continue to monitor  Acute on chronic renal failure; CKD stage 3 due to ATN from cardiorenal syndrome Acute metabolic acidosis Patient serum creatinine continue to rise Avoid hypotension Renal dose meds.  Strict I&O 2 A of bicarbonate given Monitor chemistry  Fluid & Electrolyte imbalance w/ hyperkalemia, hyperchloremia,  NAG metabolic acidosis  Serum potassium is corrected to 4.7 Closely monitor electrolytes  COVID -19 positive. This was asymptomatic and present back on 3/20 from admit on 3/20 Watchful waiting  DM type II Fingersticks are not well controlled Started on Lantus Continue SSI with a goal 140-180   Best Practice (right click and "Reselect all SmartList Selections" daily)   Diet:  Tube Feed  Pain/Anxiety/Delirium protocol (if indicated): Yes (RASS goal 0) VAP protocol (if indicated): Yes DVT prophylaxis: SCD GI prophylaxis: PPI Glucose control:  SSI Yes Central venous access:  N/A Arterial line:  Yes, and it is still needed Foley:  Yes, and it is still needed Restraints ACTION; IS/IS NOT: is not Mobility:  bed rest  PT consulted: N/A Blood pressure target: has Target goal if applicable: 120-160 Code Status:  DNR Prognosis:  Life-threating VERY POOR PROGNOSIS  Last date of multidisciplinary goals of care discussion [4/1]   Total critical care time: 43 minutes  Performed by: Cheri Fowler   Critical care time was exclusive of separately billable procedures and treating other patients.   Critical care was necessary to treat or prevent imminent or life-threatening deterioration.   Critical care was time spent personally by me on the following activities: development of treatment plan with patient and/or surrogate as well as nursing, discussions with consultants, evaluation of patient's response to treatment, examination of patient, obtaining history from patient or surrogate, ordering and performing treatments and interventions, ordering and review of laboratory studies, ordering and review of radiographic studies, pulse oximetry and re-evaluation of patient's condition.   Cheri Fowler MD [facility] See Amion for pager If no response to pager, please call [phone] until 7pm After 7pm, Please call E-link [phone]

## 2020-09-19 DIAGNOSIS — J810 Acute pulmonary edema: Secondary | ICD-10-CM | POA: Diagnosis not present

## 2020-09-19 DIAGNOSIS — R570 Cardiogenic shock: Secondary | ICD-10-CM | POA: Diagnosis not present

## 2020-09-19 DIAGNOSIS — Z515 Encounter for palliative care: Secondary | ICD-10-CM | POA: Diagnosis not present

## 2020-09-19 DIAGNOSIS — I63512 Cerebral infarction due to unspecified occlusion or stenosis of left middle cerebral artery: Secondary | ICD-10-CM | POA: Diagnosis not present

## 2020-09-19 DIAGNOSIS — J9601 Acute respiratory failure with hypoxia: Secondary | ICD-10-CM | POA: Diagnosis not present

## 2020-09-19 LAB — BASIC METABOLIC PANEL
Anion gap: 12 (ref 5–15)
Anion gap: 12 (ref 5–15)
BUN: 103 mg/dL — ABNORMAL HIGH (ref 8–23)
BUN: 113 mg/dL — ABNORMAL HIGH (ref 8–23)
CO2: 14 mmol/L — ABNORMAL LOW (ref 22–32)
CO2: 19 mmol/L — ABNORMAL LOW (ref 22–32)
Calcium: 7.7 mg/dL — ABNORMAL LOW (ref 8.9–10.3)
Calcium: 7.5 mg/dL — ABNORMAL LOW (ref 8.9–10.3)
Chloride: 113 mmol/L — ABNORMAL HIGH (ref 98–111)
Chloride: 109 mmol/L (ref 98–111)
Creatinine, Ser: 4.58 mg/dL — ABNORMAL HIGH (ref 0.61–1.24)
Creatinine, Ser: 4.85 mg/dL — ABNORMAL HIGH (ref 0.61–1.24)
GFR, Estimated: 13 mL/min — ABNORMAL LOW (ref >60)
GFR, Estimated: 12 mL/min — ABNORMAL LOW (ref >60)
Glucose, Bld: 213 mg/dL — ABNORMAL HIGH (ref 70–99)
Glucose, Bld: 151 mg/dL — ABNORMAL HIGH (ref 70–99)
Potassium: 4.3 mmol/L (ref 3.5–5.1)
Potassium: 4.2 mmol/L (ref 3.5–5.1)
Sodium: 139 mmol/L (ref 135–145)
Sodium: 140 mmol/L (ref 135–145)

## 2020-09-19 LAB — CBC
HCT: 38.4 % — ABNORMAL LOW (ref 39.0–52.0)
Hemoglobin: 12.9 g/dL — ABNORMAL LOW (ref 13.0–17.0)
MCH: 29.8 pg (ref 26.0–34.0)
MCHC: 33.6 g/dL (ref 30.0–36.0)
MCV: 88.7 fL (ref 80.0–100.0)
Platelets: 106 10*3/uL — ABNORMAL LOW (ref 150–400)
RBC: 4.33 MIL/uL (ref 4.22–5.81)
RDW: 14.6 % (ref 11.5–15.5)
WBC: 9.2 10*3/uL (ref 4.0–10.5)
nRBC: 0.2 % (ref 0.0–0.2)

## 2020-09-19 LAB — GLUCOSE, CAPILLARY
Glucose-Capillary: 195 mg/dL — ABNORMAL HIGH (ref 70–99)
Glucose-Capillary: 216 mg/dL — ABNORMAL HIGH (ref 70–99)
Glucose-Capillary: 216 mg/dL — ABNORMAL HIGH (ref 70–99)
Glucose-Capillary: 168 mg/dL — ABNORMAL HIGH (ref 70–99)
Glucose-Capillary: 126 mg/dL — ABNORMAL HIGH (ref 70–99)
Glucose-Capillary: 137 mg/dL — ABNORMAL HIGH (ref 70–99)

## 2020-09-19 LAB — BRAIN NATRIURETIC PEPTIDE: B Natriuretic Peptide: 1891.9 pg/mL — ABNORMAL HIGH (ref 0.0–100.0)

## 2020-09-19 MED ORDER — POLYETHYLENE GLYCOL 3350 17 G PO PACK
17.0000 g | PACK | Freq: Every day | ORAL | Status: DC
  Administered 2020-09-20 – 2020-09-21 (×2): 17 g
  Filled 2020-09-19 (×2): qty 1

## 2020-09-19 MED ORDER — DOCUSATE SODIUM 50 MG/5ML PO LIQD
100.0000 mg | Freq: Two times a day (BID) | ORAL | Status: DC
  Administered 2020-09-20 – 2020-09-21 (×3): 100 mg
  Filled 2020-09-19 (×3): qty 10

## 2020-09-19 MED ORDER — MIDAZOLAM HCL 2 MG/2ML IJ SOLN
1.0000 mg | INTRAMUSCULAR | Status: DC | PRN
  Administered 2020-09-19 – 2020-09-20 (×2): 1 mg via INTRAVENOUS
  Filled 2020-09-19 (×2): qty 2

## 2020-09-19 MED ORDER — MIDAZOLAM HCL 2 MG/2ML IJ SOLN: 1.0000 mg | INTRAMUSCULAR | Status: DC | PRN

## 2020-09-19 MED ORDER — SODIUM BICARBONATE 8.4 % IV SOLN
100.0000 meq | Freq: Once | INTRAVENOUS | Status: AC
  Administered 2020-09-19: 100 meq via INTRAVENOUS
  Filled 2020-09-19: qty 50

## 2020-09-19 MED ORDER — INSULIN GLARGINE 100 UNIT/ML ~~LOC~~ SOLN
20.0000 [IU] | Freq: Every day | SUBCUTANEOUS | Status: DC
  Administered 2020-09-19 – 2020-09-21 (×3): 20 [IU] via SUBCUTANEOUS
  Filled 2020-09-19 (×4): qty 0.2

## 2020-09-19 NOTE — Progress Notes (Signed)
Daily Progress Note   Patient Name: Michael [name]       Date: 09/19/2020 DOB: 17-Aug-1945  Age: 75 y.o. MRN#: [id number] Attending Physician: Michael Plan, MD Primary Care Physician: Michael Party, NP Admit Date: 22-Sep-2020  Reason for Consultation/Follow-up: To discuss complex medical decision making related to patient's goals of care  Patient is more alert today.  Opens his left eye when I call his name.   He appears more comfortable today on the fentanyl gtt.   Subjective: Spoke with daughter Michael [name].  She is coming this evening to see him and we made an appointment for a family meeting tomorrow at 4 pm.  Burgess Estelle - Michael [name] expressed that he had told her "if its bad, let me go".   Today she tells me "If he's fighting I can't make the decision". She is struggling mightily with making the best possible decisions for her father.   Assessment: Patient intubated after a devastating stroke, then a bleed and another stroke. Renal failure, heart failure, acute liver failure, respiratory failure and "brain failure" I fear he will struggle from here on out.  Patient more comfortable today.  Family struggling with what they feel are very weighty decisions.   Patient Profile/HPI:  75 y.o. 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  He was diagnosed with a large left MCA CVA.  He underwent thrombectomy with stent.  On 4/1 imaging revealed a new sub arachnoid hemorrhage and another stroke.  His course has been complicated with afib w/RVR and cardiogenic shock.  At the time of this consultation Michael [name] is on the vent but has increased work of breathing.  He is  tachycardic 130s - 140s.  His transaminases are in the thousands, and his creatinine has risen to 3.66.  He is on pressors to maintain his blood pressure.   Length of Stay: 3   Vital Signs: BP 98/82   Pulse (!) 114   Temp 98.2 F (36.8 C) (Oral)   Resp 19   Ht 5\' 2"  (1.575 m)   Wt 88.7 kg   SpO2 100%   BMI 35.77 kg/m  SpO2: SpO2: 100 % O2 Device: O2 Device: Ventilator O2 Flow Rate:  O2 Flow Rate (L/min): 0 L/min       Palliative Assessment/Data: 10%     Palliative Care [name]    Recommendations/[name]:  PMT will follow with you.   Family meeting planned for 4:00 pm Monday.  Code Status:  DNR  Prognosis:  With out life support he would have hours to days.  If family opts for on-going life support he would still most likely only have weeks to months.  Discharge Planning:  To Be Determined  Care [name] was discussed with Daughter and ICU RN.  Thank you for allowing the Palliative Medicine Team to assist in the care of this patient.  Total time spent:  35 min.     Greater than 50%  of this time was spent counseling and coordinating care related to the above assessment and [name].  Michael Richards, PA-C Palliative Medicine  Please contact Palliative MedicineTeam phone at [phone] for questions and concerns between 7 am - 7 pm.   Please see AMION for individual provider pager numbers.

## 2020-09-19 NOTE — Progress Notes (Signed)
Progress Note  Patient Name: Michael [name] Date of Encounter: 09/19/2020  [facility] Cardiologist: Dr. Beatrix Fetters  Patient Profile     75 y.o. male w/PMHx of advanced NICM s/p BiV ICD, HTN, HLD, CAD, COPD, CKDIIIb and permanent AF was recently hospitalized and was found to be COVID+. On 3/24, patient underwent generator replacement for his ICD given it was EOS as of 5/21  His xarelto was held perioperatively given his heightened risk of hematoma/infection. He resumed [facility] 09/15/2020.   He presents 09/08/2020 with aphasia and was found to have an acute L MCA stroke. He had an IR procedure with thrombectomy. He has been intubated for airway protection.   EP consulted for [facility]   CT >>  interval development small-mod small to moderate subarachnoid hemorrhage within the left sylvian fissure. Acute left frontal infarct involving the frontal operculum is seen with no significant associated mass effect or midline shift.  Echo EF < 20%   Yesterday started on amiodarone gtt and neo synepherine, worsening renal function; liver function  Started on IV lasix >>oliguric  \stopped amio 2/2 shock liver and not DCCV 2/2 concern of recent stroke and no anticoagulation   This decision would require further family discussion     Subjective   Opens eye to voice  No other responses but dont know if he speaks english   Inpatient Medications    Scheduled Meds: .  stroke: mapping our early stages of recovery book   Does not apply Once  . chlorhexidine gluconate (MEDLINE KIT)  15 mL Mouth Rinse BID  . Chlorhexidine Gluconate Cloth  6 each Topical Q0600  . insulin aspart  0-15 Units Subcutaneous Q4H  . insulin glargine  20 Units Subcutaneous Daily  . ipratropium-albuterol  3 mL Nebulization Q6H  . mouth rinse  15 mL Mouth Rinse 10 times per day  . pantoprazole sodium  40 mg Per Tube Q1200  . sodium chloride flush  3 mL Intravenous Once   Continuous Infusions: . sodium chloride Stopped (09/19/20 1049)   . cefTRIAXone (ROCEPHIN)  IV Stopped (09/19/20 0946)  . feeding supplement (VITAL AF 1.2 CAL) 1,000 mL (09/19/20 1213)  . fentaNYL infusion INTRAVENOUS 20 mcg/hr (09/19/20 1100)  . furosemide (LASIX) 200 mg in dextrose 5% 100 mL (30m/mL) infusion 8 mg/hr (09/19/20 1100)  . norepinephrine (LEVOPHED) Adult infusion 7 mcg/min (09/19/20 1211)   PRN Meds: acetaminophen **OR** acetaminophen (TYLENOL) oral liquid 160 mg/5 mL **OR** acetaminophen, fentaNYL, fentaNYL (SUBLIMAZE) injection, LORazepam, ondansetron (ZOFRAN) IV, senna-docusate   Vital Signs    Vitals:   09/19/20 1000 09/19/20 1100 09/19/20 1134 09/19/20 1200  BP: 114/77 112/87  110/79  Pulse: (!) 111 (!) 109 (!) 106 (!) 110  Resp: 14 15 20  (!) 22  Temp:      TempSrc:      SpO2: 100% 100% 100% 100%  Weight:      Height:        Intake/Output Summary (Last 24 hours) at 09/19/2020 1339 Last data filed at 09/19/2020 1100 Gross per 24 hour  Intake 1631.77 ml  Output 680 ml  Net 951.77 ml   Last 3 Weights 09/19/2020 09/18/2020 09/10/2020  Weight (lbs) 195 lb 8.8 oz 190 lb 0.6 oz 167 lb 5.3 oz  Weight (kg) 88.7 kg 86.2 kg 75.9 kg      Telemetry    A. fib 100-120s - Personally Reviewed  ECG       Physical Exam   Well developed and nourished intubated HENT normal  Neck supple  Clear laterally  Regular rate and rhythm, no murmurs or gallops Abd-soft with active BS No Clubbing cyanosis edema Skin-warm and dry Neuro unassessible responds to voice opening his eye       Labs    High Sensitivity Troponin:  No results for input(s): TROPONINIHS in the last 720 hours.    Chemistry Recent Labs  Lab 08/17/2020 1301 08/19/2020 1342 09/17/20 0541 09/17/20 1720 09/18/20 0500 09/18/20 1321 09/18/20 1618 09/19/20 0529  NA 138   < > 137   < > 134* 143 140 139  K 4.8   < > 5.0   < > 4.7 4.5 4.4 4.3  CL 112*  --  116*   < > 113*  --  114* 113*  CO2 18*  --  13*   < > 10*  --  12* 14*  GLUCOSE 233*  --  107*   < > 257*  --   267* 213*  BUN 63*  --  62*   < > 76*  --  91* 103*  CREATININE 1.80*  --  2.31*   < > 3.66*  --  4.08* 4.58*  CALCIUM 8.8*  --  7.9*   < > 7.7*  --  7.7* 7.7*  PROT 6.0*  --  5.6*  --  5.5*  --   --   --   ALBUMIN 3.3*  --  2.9*  --  2.9*  --   --   --   AST 40  --  569*  --  4,269*  --   --   --   ALT 22  --  530*  --  2,834*  --   --   --   ALKPHOS 75  --  67  --  66  --   --   --   BILITOT 1.5*  --  1.0  --  1.5*  --   --   --   GFRNONAA 39*  --  29*   < > 17*  --  15* 13*  ANIONGAP 8  --  8   < > 11  --  14 12   < > = values in this interval not displayed.     Hematology Recent Labs  Lab 09/17/20 0541 09/18/20 0500 09/18/20 1321 09/19/20 0529  WBC 13.7* 12.4*  --  9.2  RBC 4.74 4.24  --  4.33  HGB 14.4 12.4* 13.6 12.9*  HCT 43.9 39.2 40.0 38.4*  MCV 92.6 92.5  --  88.7  MCH 30.4 29.2  --  29.8  MCHC 32.8 31.6  --  33.6  RDW 14.5 14.8  --  14.6  PLT 162 104*  --  106*    BNP Recent Labs  Lab 09/17/20 1106 09/18/20 0500 09/19/20 0529  BNP 1,395.0* 1,603.8* 1,891.9*     DDimer No results for input(s): DDIMER in the last 168 hours.   Radiology    CT HEAD WO CONTRAST Result Date: 09/17/2020 CLINICAL DATA:  Intracranial revascularization, altered mental status EXAM: CT HEAD WITHOUT CONTRAST TECHNIQUE: Contiguous axial images were obtained from the base of the skull through the vertex without intravenous contrast. COMPARISON:  09/15/2020 FINDINGS: Brain: Since the prior examination, there has developed small to moderate subarachnoid hemorrhage within the left sylvian fissure. Additionally, there has developed cortical effacement within the left frontal operculum in keeping with an acute cortical infarct. No abnormal mass effect or midline shift. Moderate parenchymal volume loss is again seen, commensurate with  the patient's age. Remote lacunar infarct noted within the right thalamus. Moderate periventricular white matter changes are present likely reflecting the sequela of  small vessel ischemia. The ventricular size is normal. Cerebellum is unremarkable. Advanced atherosclerotic calcifications are noted within the carotid siphons and distal vertebral arteries bilaterally. Vascular: No asymmetric hyperdense vasculature at the skull base. Residual contrast is seen within the a intravascular space. Skull: The calvarium is intact. Sinuses/Orbits: Left cleft palate noted. Mucous retention cyst noted within the left maxillary sinus. Mild mucosal thickening noted within the hypoplastic frontal and ethmoid air cells. Tiny air-fluid level noted within the left sphenoid sinus, nonspecific. The orbits are unremarkable. Other: There is fluid opacification of the left mastoid air cells and middle ear cavities. There is layering fluid noted within the posterior nasopharynx. Right mastoid air cell and middle ear cavity are clear. IMPRESSION: Interval development of a small to moderate subarachnoid hemorrhage within the left sylvian fissure. Acute left frontal infarct involving the frontal operculum is seen with no significant associated mass effect or midline shift. Stable senescent changes.  Remote right thalamic infarct. These results will be called to the ordering clinician or representative by the Radiologist Assistant, and communication documented in the PACS or [employer]. Electronically Signed   By: Fidela Salisbury MD   On: 09/17/2020 05:09    Cardiac Studies    09/06/20: TTE IMPRESSIONS  1. Global hypokinesis of LV with apical akinesis. . Left ventricular  ejection fraction, by estimation, is <20%. The left ventricle has severely  decreased function. The left ventricle demonstrates global hypokinesis.  The left ventricular internal cavity  size was severely dilated. There is mild left ventricular hypertrophy.  Left ventricular diastolic parameters are indeterminate.  2. Right ventricular systolic function is low normal. The right  ventricular size is mildly enlarged.  There is normal pulmonary artery  systolic pressure.  3. Mild mitral valve regurgitation.  4. The aortic valve is tricuspid. Aortic valve regurgitation is not  visualized. Mild to moderate aortic valve sclerosis/calcification is  present, without any evidence of aortic stenosis.     Assessment & Plan     WCT Afib with RVR   Permanent AFib  NICM  Chronic CHF sys  NSVT  Shock on  neo synephrine Characterized by acute renal failure with oliguria and shock liver    HR a little better even though off amio  As noted yesterday, our options are extremely limited   Continue supportive care but the convergence of organ failure is ominous     Virl Axe, MD 09/19/2020 1:39 PM

## 2020-09-19 NOTE — Progress Notes (Signed)
eLink Physician-Brief Progress Note Patient Name: Michael [name] DOB: [birth year] MRN: [id number]   Date of Service  09/19/2020  HPI/Events of Note  Called by RT to discuss ventilator settings. He is extremely dyssynchronous with the ventilator irrespective of whether he is on PRVC or PSV. Most likely secondary to his neuro insult (acute L MCA stroke). He is receiving fentanyl only for sedation/analgesia. His EF is < 20%.    eICU Interventions  Recommend starting midazolam drip + pushes for sedation to increase ventilator synchrony. Will avoid propofol given negative inotropic effect.     Intervention Category Major Interventions: Respiratory failure - evaluation and management  Marveen Reeks Chenell Lozon 09/19/2020, 8:30 PM

## 2020-09-19 NOTE — Progress Notes (Signed)
NAME:  Michael [name], MRN:  [id number], DOB:  23-Jul-1945, LOS: 3 ADMISSION DATE:  09/05/2020, CONSULTATION DATE:  3/31 REFERRING MD:  Stroke team Khaliqdina , CHIEF COMPLAINT:  Stroke, respiratory failure   History of Present Illness:  75 year old male w/ PMH as per below. Admitted w/ new right sided weakness and incoherent speech at 0630, LSN 0600 walking around in house. On arrival room air sats 60s.  Initial NIHS 24, had L gaze preference. CT and CTA obtained. Finding concerning for proximal left MCA Stroke. 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 3/31 admitted w/ new left MCA stroke right sided hemiparesis right sided facial droop and aphasia. Went emergently to IR, not IV TPA candidate due to xarelto. Wide complex tachycardia w/ PVCs while in CT. Cardiology consulted by EDP. Intubated for airway protection, Left Radial aline Placed by anesthesia. Underwent  mechanical thrombectomy and partial recannulization small device related dissection w/ SAH noted. Returned to ICU on vasopressin w/ goal BP 120-160. GCS 3 but sedated. Having runs of VT so amio bolus and infusion initiated.  ECHO Left ventricular ejection fraction, by estimation, is <20%. The LV severely dilated. SVR elevated PAP. RV size enlarged. RA size large.  ventricle has severely decreased function. Could not get MRI due to ICD.  Marland Kitchen 4/1 decreased UOP, got lasix. CT head showed interval small to [facility] w/in sylvian fissure and acute L frontal infarct no mass effect. Still in shock. Looking cardiogenic. Changing to norepi. Will wake up but has dense right sided hemiparesis. Continuing lasix. New shock liver.  Renal fxn worse. Asked Palliative to see. Made DNR. No HD  . 4/3 patient continued to require Levophed, remains oliguric with rising serum creatinine  Interim History / Subjective:  Remains on Levophed, kidney and liver function continued to get worse  Objective   Blood pressure 102/76, pulse (!) 50, temperature 98.2 F (36.8 C), temperature source Oral, resp. rate 17, height 5\' 2"  (1.575 m), weight 88.7 kg, SpO2 100 %. CVP:  [9 mmHg-22 mmHg] 12 mmHg  Vent Mode: PRVC FiO2 (%):  [30 %] 30 % Set Rate:  [14 bmp] 14 bmp Vt Set:  [430 mL-4230 mL] 430 mL PEEP:  [5 cmH20] 5 cmH20 Plateau Pressure:  [15 cmH20] 15 cmH20   Intake/Output Summary (Last 24 hours) at 09/19/2020 0919 Last data filed at 09/19/2020 0800 Gross per 24 hour  Intake 1665.63 ml  Output 500 ml  Net 1165.63 ml   Filed Weights   09/07/2020 1230 09/18/20 0500 09/19/20 0500  Weight: 75.9 kg 86.2 kg 88.7 kg    Examination: General: Elderly male, lying in the bed, orally intubated HENT: NCAT orally intubated, ETT and OGT in place Pulm bilateral fine crackles, no rhonchi or wheezes Card tachycardic, irregularly irregular, no murmur abd soft, nontender, nondistended, bowel sounds present Ext cool. Weak pulses Neuro opens eyes with vocal stimuli. Moves the left side. Hemiparetic on the right. Purposeful PERRL  Labs/imaging that I havepersonally reviewed  (right click and "Reselect all SmartList Selections" daily)  Serum creatinine 4.58, serum bicarbonate 14, AST > 4K, ALT 2.8K  Resolved Hospital Problem list  Assessment & Plan:   Acute left MCA stroke suspect COVID related but also could be after being off [facility] for his ICD procedure.  now s/p mechanical thrombectomy and partial recannulization small device related dissection w/ SAH noted w/ some increase in size and new left frontal infarct.   Cont serial neuro checks every hour PAD protocol RASS goal 0/-1 BP goal 120-160 Holding statin due to worsening LFTs Holding  systemic AC due to subarachnoid hemorrhage  Acute hypoxic respiratory failure  Due to acute stroke and acute pulmonary edema and gram-positive cocci of pneumonia Cont full vent support VAP bundle Continue ceftriaxone  Chronic afib w/ RVR and freq PVCs and runs of VT.  Patient's heart rate is little bit better controlled compared to yesterday Amiodarone was stopped due to rising LFTs Con tele Can't start beta-blocker/calcium channel blocker due to cardiogenic shock and requirement of vasopressors  Cardiogenic shock EF now < 20% Shock liver Continue vasopressor support with Levophed Started on IV Lasix infusion Hold spiro due to hyperkalemia LFTs continue to trend up due to hepatic congestion and shock liver Continue to monitor  Acute on chronic renal failure; CKD stage 3 due to ATN from cardiorenal syndrome Acute metabolic acidosis Patient serum creatinine continue to rise He is oliguric Avoid hypotension Renal dose meds.  Strict I&O 2 A of bicarbonate again this morning Monitor chemistry  Fluid & Electrolyte imbalance w/ hyperkalemia, hyperchloremia,  NAG metabolic acidosis  Serum potassium is corrected to 4.3 Closely monitor electrolytes  COVID -19 positive. This was asymptomatic and present back on 3/20 from admit on 3/20 Watchful waiting  DM type II Fingersticks are not well controlled Increase Lantus to 20 units Continue SSI with a goal 140-180   Best Practice (right click and "Reselect all SmartList Selections" daily)   Diet:  Tube Feed  Pain/Anxiety/Delirium protocol (if indicated): Yes (RASS goal 0) VAP protocol (if indicated): Yes DVT prophylaxis: SCD GI prophylaxis: PPI Glucose control:  SSI Yes Central venous access:  N/A Arterial line:  Yes, and it is still needed Foley:  Yes, and it is still needed Mobility:  bed rest  PT consulted: N/A Code Status:  DNR Prognosis: Life-threating VERY POOR PROGNOSIS  Last date of multidisciplinary goals of care  discussion: Primary team with palliative care   Total critical care time: 38 minutes  Performed by: Cheri Fowler   Critical care time was exclusive of separately billable procedures and treating other patients.   Critical care was necessary to treat or prevent imminent or life-threatening deterioration.   Critical care was time spent personally by me on the following activities: development of treatment plan with patient and/or surrogate as well as nursing, discussions with consultants, evaluation of patient's response to treatment, examination of patient, obtaining history from patient or surrogate, ordering and performing treatments and interventions, ordering and review of laboratory studies, ordering and review of radiographic studies, pulse oximetry and re-evaluation of patient's condition.   Cheri Fowler MD [facility] See Amion for pager If no response to pager, please call [phone] until 7pm After 7pm, Please call E-link [phone]

## 2020-09-19 NOTE — Progress Notes (Addendum)
STROKE TEAM PROGRESS NOTE   INTERVAL HISTORY No one is at the bedside.     Neurological exam: opens eyes to voice.  Blinks to visual threat on the left only.  Left gaze preference.  Not tracking on the right.  Left upper extremity spontaneous movement 4/5.  Weak withdraw to left lower extremity.  Right side hemiplegia.   Continues to require low-dose norepinephrine. Remains oliguric with rising serum creatinine despite lasix infusion.  Amiodarone infusion discontinue yesterday due to shock liver.  HR in the low 100-110 today        Vitals:   09/19/20 1134 09/19/20 1200 09/19/20 1300 09/19/20 1400  BP:  110/79 95/70 104/76  Pulse: (!) 106 (!) 110 (!) 112 (!) 108  Resp: 20 (!) 22 14 14   Temp:      TempSrc:      SpO2: 100% 100% 100% 100%  Weight:      Height:       CBC:  Recent Labs  Lab 08/24/2020 0840 09/09/2020 0845 09/18/20 0500 09/18/20 1321 09/19/20 0529  WBC 8.5   < > 12.4*  --  9.2  NEUTROABS 4.8  --   --   --   --   HGB 14.7   < > 12.4* 13.6 12.9*  HCT 46.0   < > 39.2 40.0 38.4*  MCV 93.3   < > 92.5  --  88.7  PLT 154   < > 104*  --  106*   < > = values in this interval not displayed.   Basic Metabolic Panel:  Recent Labs  Lab 09/18/20 0500 09/18/20 1321 09/18/20 1618 09/19/20 0529  NA 134*   < > 140 139  K 4.7   < > 4.4 4.3  CL 113*  --  114* 113*  CO2 10*  --  12* 14*  GLUCOSE 257*  --  267* 213*  BUN 76*  --  91* 103*  CREATININE 3.66*  --  4.08* 4.58*  CALCIUM 7.7*  --  7.7* 7.7*  MG 2.0  --  1.9  --   PHOS 5.7*  --  5.1*  --    < > = values in this interval not displayed.    Lipid Panel:  Recent Labs  Lab 09/17/20 0541  CHOL 46  TRIG 63  62  HDL 24*  CHOLHDL 1.9  VLDL 13  LDLCALC 9    HgbA1c:  Recent Labs  Lab 09/17/20 0541  HGBA1C 6.7*   Urine Drug Screen: No results for input(s): LABOPIA, COCAINSCRNUR, LABBENZ, AMPHETMU, THCU, LABBARB in the last 168 hours.  Alcohol Level No results for input(s): ETH in the last 168  hours.  IMAGING  CT head:  Result date: 09/17/2020 IMPRESSION: Interval development of a small to moderate subarachnoid hemorrhage within the left sylvian fissure. Acute left frontal infarct involving the frontal operculum is seen with no significant associated mass effect or midline shift.  Stable senescent changes.  Remote right thalamic infarct.  DG Chest Port 1 View Result Date: 09/18/2020 IMPRESSION: 1. Minimal left base/retrocardiac density unchanged and may be due to atelectasis or infection. 2.  Tubes and lines unchanged.   DG CHEST PORT 1 VIEW Result Date: 09/17/2020 IMPRESSION: Interval placement of right internal jugular catheter with distal tip in the expected position of the right atrium. No pneumothorax is noted. Otherwise stable support apparatus.  CT head Result date: 09/07/2020 IMPRESSION: 1. Area of grey-white loss and hypoattenuation in the left parieto-occipital region is age-indeterminant without  priors, but favored remote given suggestion of cortical volume loss.  2. No acute hemorrhage.  CT angio head/neck Result date: 10-15-20 IMPRESSION: 1. Occlusion versus high-grade stenosis of a proximal left M2 MCA branch (series 11, image 84; series 12, image 89; series 10, image 150). 2. 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 3. Mild to moderate stenosis of bilateral intradural vertebral arteries. 4. Ground-glass opacities in the dependent lungs bilaterally  Oct 15, 2020 Echo 1. Left ventricular ejection fraction, by estimation, is <20%. The left  ventricle has severely decreased function. The left ventricle demonstrates  global hypokinesis. The left ventricular internal cavity size was severely  dilated. Left ventricular  diastolic function could not be evaluated.  2. Right ventricular systolic function is severely reduced. The right  ventricular size is mildly enlarged. There is severely elevated pulmonary  artery systolic pressure. The  estimated right ventricular systolic  pressure is 62.9 mmHg.  3. Left atrial size was severely dilated.  4. Right atrial size was severely dilated.  5. The mitral valve is normal in structure. Mild mitral valve  regurgitation. No evidence of mitral stenosis.  6. Tricuspid valve regurgitation is moderate.  7. The aortic valve is tricuspid. There is mild calcification of the  aortic valve. Aortic valve regurgitation is not visualized. Mild aortic  valve sclerosis is present, with no evidence of aortic valve stenosis.  8. The inferior vena cava is dilated in size with <50% respiratory  variability, suggesting right atrial pressure of 15 mmHg  PHYSICAL EXAM General: Elderly male, lying in the bed, orally intubated HENT: orally intubated, ETT and OGT in place Pulm: bilateral fine crackles, no rhonchi or wheezes Card: tachy irreg w/ afib, no murmur abd: soft, nontender, nondistended, bowel sounds present Ext: Weak pulses  Neurological Exam : opens eyes to voice.  Blinks to visual threat on the left only.  Left gaze preference.  Not tracking on the right.  Left upper extremity spontaneous movement 4/5.  Weak withdraw to left lower extremity.  Right side hemiplegia.  ASSESSMENT/[name] [name] is a 75 y.o. male with history of  Afib on Xarelto(last dose in AM on 09/15/20), CKD 3, COPD, dilated cardiomyopathy, HLD, HTN, ICD, DM2, Vtach who woke up fine at 0500. Last seen by family at 0630 on 15-Oct-2020. Found down at 0700. Moaning. Immediately called 911 and he was brought in by EMS as a stroke code for R hemiplegia, aphasia, R facial droop.  Workup with CTH with no ICH, loss of grey white differentiation and hypoattenuation in the left parieto-occipital region, likely remote. CT angio concerning for proximal left M2 MCA branch occlusion vs high grade stenosis. He underwent angiogram, showing left M2/MCA occlusion with mechanical thrombectomy and partial recanalization achieved. Pt  developd device related dissection with subarachnoid hemorrhage seen.   Given multiplicity of medical conditions (acute rnal failure, SAH, congestive heart failure, cardiomyopathy), need to continue conversation with patient's family regarding consideration of palliative care.    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 Severe cardiomyopathy vs. COVID related hypercoagulable state   Code stroke CT head: Area of grey-white loss and hypoattenuation in the left parieto-occipital region   CTA head/neck: Occlusion versus high-grade stenosis of a proximal left M2 MCA branch  MRI  : unable to obtain d/t ICD  S/p IR with TICI 2b reperfusion  CT head (09/17/2020): subarachnoid hemorrhage within the left sylvian  fissure. Acute left frontal infarct involving the frontal operculum  2D Echo   10/04/2020: EF <20%, left ventricle has severely decreased function, global hypokinesis. Left atrial size was severely dilated. No intracardiac source of embolism detected   LDL 9  HgbA1c 6.7  VTE prophylaxis - SCDs  Diet: NPO  Xarelto (rivaroxaban) daily prior to admission, now on No antithrombotic due to [facility].    Therapy recommendations:  pending  Disposition: pt critically ill, with multiorgan failure, palliative care on board, family meeting tomorrow 4 PM  Left sylvian fissure SAH  SBP goal <160 to prevent further hemorrhage  Hold chemical DVT prophylaxis for now  Serial neuro checks  Sodium goal >135  Chronic afib w/ RVR   Cardiology on board, amiodarone d/c due to liver failure  Holding off AC in the setting of SAH  Heart failure  Cardiogenic shock  Echo: EF <20%, left ventricle has severely decreased function, left ventricle demonstrates global hypokinesis  Cardiology consult today appreciate recs  Started on IV Lasix infusion 8mg /hr in the setting of cardiogenic shock, shock liver, and  worsening renal failure  Respiratory Failure Septic shock  Due to acute stroke, acute pulmonary edema, and gram-positive cocci of pneumonia  Full vent support  Sputum culture (3/31): gram-positive cocci   Ceftriaxone 2g q 24hrs  WBC 13.7->12.4->9.2  CCM managing  Acute on chronic renal failure  ATN from cardiorenal syndrome  Creatinine level 1.7->2.31->3.07->3.66->4.58   minimal urine output  Lasix infusion 8mg /hr  Continue to monitor uop  Liver failure   AST/ALT 4269/2834  Amiodarone discontinued  CCM on board  COVID infection   3/31 PCR + for COVID  Respiratory failure with intubation  Supportive care  Hypertension history hypotension  Home meds:  lisinopril  Currently hypotensive requiring low-dose levophed  SBP goal <160 in the setting of SAH  MAP goal >65 . Long-term BP goal normotensive  Hyperlipidemia  Home meds:  atorvastatin,  resumed in hospital  LDL 9, goal < 70  No statin now due to extremely low LDL and liver failure  Diabetes type II Controlled  Home meds:  8 units insulin at 10pm  Increase Lantus to 20 units daily  HgbA1c 6.7, goal < 7.0  Currently hyperglycemia  CBGs  SSI  Other Stroke Risk Factors   Advanced Age >/= 35    Coronary artery disease   Congestive heart failure   Michael [name], ACNP Stroke Nurse Practitioner 09/19/2020  ATTENDING NOTE: I reviewed above note and agree with the assessment and [name]. Pt was seen and examined.   Pt still intubated on sedation, eyes open on voice, seems more awake than yesterday but still needed repetitive stimulation to keep eyes open, not following commands. With forced eye opening, eyes in left gaze position, barely cross midline, not blinking to visual threat on the right, doll's eyes present, not tracking on the right, PERRL. Corneal reflex present, gag and cough present. Breathing over the vent.  Facial symmetry not able to test due to ET tube.  Tongue  protrusion not cooperative. On pain stimulation, pt spontaneous movement on the left against gravity but no movement on the right UE and mild withdraw on the right LE. No babinski. Sensation, coordination and gait not tested.  Patient continues to have multiorgan failure, worsening renal failure, continued respiratory failure, septic shock, cardiogenic shock and liver failure.  CCM and cardiology management appreciated.  Palliative care on board, planning for family meeting tomorrow at 4 PM.  For detailed assessment and [name],  please refer to above as I have made changes wherever appropriate.   Michael Plan, MD PhD Stroke Neurology 09/19/2020 5:49 PM  This patient is critically ill due to left hemispheric stroke, status post thrombectomy, subarachnoid hemorrhage, multiorgan failure and at significant risk of neurological worsening, death form multiorgan failure, cerebral edema, cerebral vasospasm, seizure, recurrent stroke. This patient's care requires constant monitoring of vital signs, hemodynamics, respiratory and cardiac monitoring, review of multiple databases, neurological assessment, discussion with family, other specialists and medical decision making of high complexity. I spent 35 minutes of neurocritical care time in the care of this patient.  I discussed with Dr. Merrily Pew CCM.   To contact Stroke Continuity provider, please refer to [email or web address]. After hours, contact General Neurology

## 2020-09-19 NOTE — Progress Notes (Signed)
Physical Therapy Discharge Patient Details Name: Michael [name] MRN: [id number] DOB: 1946/04/28 Today's Date: 09/19/2020 Time:  -     Patient discharged from PT services secondary to pt continuing to be medically inappropriate for a PT eval and "Family requests that while they are deliberating over whether or not to move forward with a compassionate extubation, please keep him as comfortable as possible.", per Palliative note 4/2.  Due to continued medical decline was unable to perform a PT evaluation .   If pt does improve and family wishes to move forward with PT please re-consult PT services. Thank you for this referral.   Raymond Gurney, PT, DPT Acute Rehabilitation Services  Pager: [phone] Office: [phone]   GP     Henrene Dodge Pettis 09/19/2020, 7:52 AM

## 2020-09-20 ENCOUNTER — Encounter: Payer: Medicare Other | Admitting: Physician Assistant

## 2020-09-20 DIAGNOSIS — N179 Acute kidney failure, unspecified: Secondary | ICD-10-CM | POA: Diagnosis not present

## 2020-09-20 DIAGNOSIS — R570 Cardiogenic shock: Secondary | ICD-10-CM | POA: Diagnosis not present

## 2020-09-20 DIAGNOSIS — Z7189 Other specified counseling: Secondary | ICD-10-CM

## 2020-09-20 DIAGNOSIS — J9601 Acute respiratory failure with hypoxia: Secondary | ICD-10-CM | POA: Diagnosis not present

## 2020-09-20 DIAGNOSIS — Z515 Encounter for palliative care: Secondary | ICD-10-CM | POA: Diagnosis not present

## 2020-09-20 DIAGNOSIS — I63512 Cerebral infarction due to unspecified occlusion or stenosis of left middle cerebral artery: Secondary | ICD-10-CM | POA: Diagnosis not present

## 2020-09-20 LAB — CBC
HCT: 38.5 % — ABNORMAL LOW (ref 39.0–52.0)
Hemoglobin: 12.9 g/dL — ABNORMAL LOW (ref 13.0–17.0)
MCH: 29.9 pg (ref 26.0–34.0)
MCHC: 33.5 g/dL (ref 30.0–36.0)
MCV: 89.3 fL (ref 80.0–100.0)
Platelets: 102 10*3/uL — ABNORMAL LOW (ref 150–400)
RBC: 4.31 MIL/uL (ref 4.22–5.81)
RDW: 14.6 % (ref 11.5–15.5)
WBC: 6.5 10*3/uL (ref 4.0–10.5)
nRBC: 0 % (ref 0.0–0.2)

## 2020-09-20 LAB — TRIGLYCERIDES: Triglycerides: 73 mg/dL (ref <150)

## 2020-09-20 LAB — GLUCOSE, CAPILLARY
Glucose-Capillary: 130 mg/dL — ABNORMAL HIGH (ref 70–99)
Glucose-Capillary: 168 mg/dL — ABNORMAL HIGH (ref 70–99)
Glucose-Capillary: 153 mg/dL — ABNORMAL HIGH (ref 70–99)
Glucose-Capillary: 149 mg/dL — ABNORMAL HIGH (ref 70–99)
Glucose-Capillary: 140 mg/dL — ABNORMAL HIGH (ref 70–99)

## 2020-09-20 LAB — BRAIN NATRIURETIC PEPTIDE: B Natriuretic Peptide: 715 pg/mL — ABNORMAL HIGH (ref 0.0–100.0)

## 2020-09-20 LAB — HEPATIC FUNCTION PANEL
ALT: 1532 U/L — ABNORMAL HIGH (ref 0–44)
AST: 453 U/L — ABNORMAL HIGH (ref 15–41)
Albumin: 2.2 g/dL — ABNORMAL LOW (ref 3.5–5.0)
Alkaline Phosphatase: 83 U/L (ref 38–126)
Bilirubin, Direct: 0.7 mg/dL — ABNORMAL HIGH (ref 0.0–0.2)
Indirect Bilirubin: 0.8 mg/dL (ref 0.3–0.9)
Total Bilirubin: 1.5 mg/dL — ABNORMAL HIGH (ref 0.3–1.2)
Total Protein: 5.1 g/dL — ABNORMAL LOW (ref 6.5–8.1)

## 2020-09-20 LAB — BASIC METABOLIC PANEL
Anion gap: 14 (ref 5–15)
BUN: 122 mg/dL — ABNORMAL HIGH (ref 8–23)
CO2: 19 mmol/L — ABNORMAL LOW (ref 22–32)
Calcium: 7.8 mg/dL — ABNORMAL LOW (ref 8.9–10.3)
Chloride: 107 mmol/L (ref 98–111)
Creatinine, Ser: 5.09 mg/dL — ABNORMAL HIGH (ref 0.61–1.24)
GFR, Estimated: 11 mL/min — ABNORMAL LOW (ref >60)
Glucose, Bld: 156 mg/dL — ABNORMAL HIGH (ref 70–99)
Potassium: 4.2 mmol/L (ref 3.5–5.1)
Sodium: 140 mmol/L (ref 135–145)

## 2020-09-20 MED ORDER — HEPARIN SODIUM (PORCINE) 5000 UNIT/ML IJ SOLN
5000.0000 [IU] | Freq: Three times a day (TID) | INTRAMUSCULAR | Status: DC
  Administered 2020-09-20 – 2020-09-23 (×9): 5000 [IU] via SUBCUTANEOUS
  Filled 2020-09-20 (×9): qty 1

## 2020-09-20 NOTE — Progress Notes (Signed)
  Pt remains intubated and sedated with MSOF. Palliative care meeting scheduled for 4 pm today.   Discussed above with Dr. Lalla Brothers.  Prognosis very poor.   Casimiro Needle [address] [place], PA-C  09/20/2020 9:22 AM

## 2020-09-20 NOTE — Progress Notes (Signed)
OT Cancellation Note  Patient Details Name: Soul Hackman MRN: [id number] DOB: 06-18-1946   Cancelled Treatment:    Reason Eval/Treat Not Completed: Patient not medically ready (sedated intubated multiple organ failure with pending family meeting today) OT to hold evaluation pending appropriateness. Please DC OT orders if family decides to continue comfort measures. If family declines to continue with aggressive treatment will need to reorder PT as well.  Wynona Neat, OTR/L  Acute Rehabilitation Services Pager: [phone] Office: [phone] .  09/20/2020, 8:37 AM

## 2020-09-20 NOTE — Progress Notes (Addendum)
NAME:  Michael [name], MRN:  [id number], DOB:  08/22/1945, LOS: 4 ADMISSION DATE:  09-26-20, CONSULTATION DATE:  3/31 REFERRING MD:  Stroke team Khaliqdina , CHIEF COMPLAINT:  Stroke, respiratory failure   History of Present Illness:  75 year old male w/ PMH as per below. Admitted w/ new right sided weakness and incoherent speech at 0630, LSN 0600 walking around in house. On arrival room air sats 60s.  Initial NIHS 24, had L gaze preference. CT and CTA obtained. Finding concerning for proximal left MCA Stroke. 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 3/31 admitted w/ new left MCA stroke right sided hemiparesis right sided facial droop and aphasia. Went emergently to IR, not IV TPA candidate due to xarelto. Wide complex tachycardia w/ PVCs while in CT. Cardiology consulted by EDP. Intubated for airway protection, Left Radial aline Placed by anesthesia. Underwent  mechanical thrombectomy and partial recannulization small device related dissection w/ SAH noted. Returned to ICU on vasopressin w/ goal BP 120-160. GCS 3 but sedated. Having runs of VT so amio bolus and infusion initiated.  ECHO Left ventricular ejection fraction, by estimation, is <20%. The LV severely dilated. SVR elevated PAP. RV size enlarged. RA size large.  ventricle has severely decreased function. Could not get MRI due to ICD.  Marland Kitchen 4/1 decreased UOP, got lasix. CT head showed interval small to [facility] w/in sylvian fissure and acute L frontal infarct no mass effect. Still in shock. Looking cardiogenic. Changing to norepi. Will wake up but has dense right sided hemiparesis. Continuing lasix. New shock liver.  Renal fxn worse. Asked Palliative to see. Made DNR. No HD  . 4/3 patient continued to require Levophed, remains oliguric with rising serum creatinine  Interim History / Subjective:  Patient kidney function continued to get worse, LFTs started improving.  He made 950 mL urine in last 24 hours Overnight he became dyssynchronous with the vent, was started on midazolam infusion  Objective   Blood pressure 97/74, pulse (!) 114, temperature 97.6 F (36.4 C), temperature source Oral, resp. rate 16, height 5\' 2"  (1.575 m), weight 91.5 kg, SpO2 100 %. CVP:  [6 mmHg-13 mmHg] 10 mmHg  Vent Mode: PRVC FiO2 (%):  [30 %] 30 % Set Rate:  [14 bmp] 14 bmp Vt Set:  [430 mL] 430 mL PEEP:  [5 cmH20] 5 cmH20 Plateau Pressure:  [9 cmH20-13 cmH20] 11 cmH20   Intake/Output Summary (Last 24 hours) at 09/20/2020 0750 Last data filed at 09/20/2020 0600 Gross per 24 hour  Intake 1623.45 ml  Output 1230 ml  Net 393.45 ml   Filed Weights   09/18/20 0500 09/19/20 0500 09/20/20 0400  Weight: 86.2 kg 88.7 kg 91.5 kg    Examination: General: Elderly male, lying in the bed, orally intubated HENT: NCAT orally intubated, ETT and OGT in place Pulm: Reduced air entry bilaterally, with fine crackles, no rhonchi or wheezes Card: tachycardic, irregularly irregular, no murmur abd soft, nontender, nondistended, bowel sounds present Ext cool. Weak pulses Neuro: Sedated, opens eyes with painful stimuli. Moves the left side. Hemiparetic on the right. Purposeful PERRL  Labs/imaging that I havepersonally reviewed  (  right click and "Reselect all SmartList Selections" daily)  Serum creatinine 5.09, serum bicarbonate 19, AST > 1532, ALT 453  Resolved Hospital Problem list     Assessment & Plan:   Acute left MCA stroke suspect COVID related but also could be after being off [facility] for his ICD procedure.  now s/p mechanical thrombectomy and partial recannulization small device related dissection w/ SAH noted w/ some increase in  size and new left frontal infarct.   Cont serial neuro checks every hour PAD protocol RASS goal -1 Holding statin due to elevated LFTs Holding systemic AC due to subarachnoid hemorrhage Management deferred to stroke team Currently on midazolam and fentanyl infusion  Acute hypoxic respiratory failure  Due to acute stroke and acute pulmonary edema and gram-positive cocci of pneumonia Cont full vent support, remain on FiO2 30% PEEP of 5 VAP bundle Continue ceftriaxone  Chronic afib w/ RVR and freq PVCs and runs of VT.  Patient's heart rate is little bit better controlled compared to yesterday Amiodarone was stopped due to rising LFTs Con tele Can't start beta-blocker/calcium channel blocker due to cardiogenic shock and requirement of vasopressors  Cardiogenic shock EF now < 20% Shock liver Continue vasopressor support with Levophed Continue IV Lasix infusion Hold spironolactone for now LFTs started improving  Acute on chronic renal failure; CKD stage 3 due to ATN from cardiorenal syndrome Acute metabolic acidosis Patient serum creatinine continue to rise He made 950 mL urine in last 24-hour Avoid hypotension Renal dose meds.  Strict I&O Serum bicarbonate improved to 19 Monitor chemistry  Fluid & Electrolyte imbalance w/ hyperkalemia, hyperchloremia,  NAG metabolic acidosis  Serum potassium is corrected to 4.3 Closely monitor electrolytes  COVID -19 positive. This was asymptomatic and present back on 3/20 from admit on 3/20 Watchful waiting  DM type II Fingersticks are not well controlled Increase Lantus to 20 units Continue SSI with a goal 140-180   Best Practice (right click and "Reselect all SmartList Selections" daily)   Diet:  Tube Feed  Pain/Anxiety/Delirium protocol (if indicated): Yes with midazolam and fentanyl infusion VAP protocol (if indicated): Yes DVT prophylaxis: SCD GI prophylaxis: PPI Glucose control:  SSI Yes Central venous access: Yes  needed Arterial line: No Foley:  Yes, and it is still needed Mobility:  bed rest  PT consulted: N/A Code Status:  DNR Prognosis: Life-threating POOR PROGNOSIS  Last date of multidisciplinary goals of care discussion: Family meeting with palliative care is scheduled at 4 PM today    Total critical care time: 33 minutes  Performed by: Cheri Fowler   Critical care time was exclusive of separately billable procedures and treating other patients.   Critical care was necessary to treat or prevent imminent or life-threatening deterioration.   Critical care was time spent personally by me on the following activities: development of treatment plan with patient and/or surrogate as well as nursing, discussions with consultants, evaluation of patient's response to treatment, examination of patient, obtaining history from patient or surrogate, ordering and performing treatments and interventions, ordering and review of laboratory studies, ordering and review of radiographic studies, pulse oximetry and re-evaluation of patient's condition.   Cheri Fowler MD [facility] See Amion for pager If no response to pager, please call [phone] until 7pm After 7pm, Please call E-link [phone]

## 2020-09-20 NOTE — Progress Notes (Signed)
SLP Cancellation Note  Patient Details Name: Michael [name] MRN: [id number] DOB: [birth year]   Cancelled treatment:       Reason Eval/Treat Not Completed: Patient not medically ready (remains on vent). Will continue to follow.     Mahala Menghini., M.A. CCC-SLP Acute Rehabilitation Services Pager [phone] Office [phone]  09/20/2020, 7:25 AM

## 2020-09-20 NOTE — Consult Note (Addendum)
Daily Progress Note   Patient Name: Michael [name]       Date: 09/20/2020 DOB: 05-20-1946  Age: 75 y.o. MRN#: [id number] Attending Physician: Rosalin Hawking, MD Primary Care Physician: Lowella Dandy, NP Admit Date: 08/18/2020  Reason for Consultation/Follow-up: To discuss complex medical decision making related to patient's goals of care  Patient will respond when name is called.  Creatinine worsening but he is making urine.  His LFTs are improving.  CHF being managed.   Subjective: Dorthy Cooler, PA-C and I met with Michael [name], Dtr, and close family friend Michael [name] at bedside and then in a conference room.  We discussed the patient's strong, independent nature.  Both [place] expressed great respect and love for the patient.  He had made his children promise him many times over the years - please never put me in a nursing home.    We discussed his heart failure (EF < 20%) and his kidney failure (creatinine > 5) in addition to the unknowns.  The unknowns include: will he be able to breathe effectively off the vent?  Will he be able to eat?  Will he be able to speak?   We talked about prognosis - best case even with a trach and a PEG - his prognosis would be perhaps months due to his heart and renal failure.  Michael [name] was very clear that her father is a very 14 man - he would much rather be with the lord than live dependent for care in a nursing facility.  "he would never want that.  He would just want to die comfortably".  Assessment: Patient showing signs of increased alertness.  However renal function continues to decline.    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 He was diagnosed with a large left MCA CVA. He underwent thrombectomy with stent. On 4/1 imaging revealed a new sub arachnoid hemorrhage and another stroke. His course has been complicated with afib w/RVR and cardiogenic shock. At the time of this consultation [name] is on the vent but has increased work of breathing. He is tachycardic 130s - 140s. His transaminases are in the thousands, and his creatinine has risen to 3.66.  He is on pressors to maintain his blood pressure.  Length of Stay: 4   Vital Signs: BP 103/69 (BP Location: Right Arm)   Pulse 60   Temp 98.1 F (36.7 C) (Axillary)   Resp (!) 24   Ht _0  (1.575 m)   Wt 91.5 kg   SpO2 100%   BMI 36.90 kg/m  SpO2: SpO2: 100 % O2 Device: O2 Device: Ventilator O2 Flow Rate: O2 Flow Rate (L/min): 0 L/min       Palliative Assessment/Data: 10%     Palliative Care Plan    Recommendations/Plan:  Allow family to visit 2 at a time over the next few days while optimizing the patient medically until he is at his best possible point for success.    At the optimal point notify Michael [name] that he will be extubated and allow family to be present for support during one-way extubation.  If he starts to decline please shift quickly to full comfort and do not allow Mr. Niehoff to suffer.  My Colleague [name] Burt Knack, PA-C will take over care of the [facility] family on 4/5.  Code Status:  DNR  Prognosis:   Unable to determine.  Given significant brain injury, heart failure (EF < 20), Renal failure, hypoalbuminemia, [name] has a likely prognosis of days.   Discharge Planning:  To Be Determined  Hospital death would not be surprising.  Care plan was discussed with family.  Thank you for allowing the Palliative Medicine Team to assist in the care of this patient.  Total time spent:  70 min. Time In 4:00 pm Time out:  5:10 pm     Greater than 50%  of this time was  spent counseling and coordinating care related to the above assessment and plan.  Florentina Jenny, PA-C Palliative Medicine  Please contact Palliative MedicineTeam phone at [phone] for questions and concerns between 7 am - 7 pm.   Please see AMION for individual provider pager numbers.

## 2020-09-20 NOTE — Progress Notes (Addendum)
STROKE TEAM PROGRESS NOTE   INTERVAL HISTORY No one is at the bedside.     Neurological exam: Much more awake today.  Able to keep eyes open throughout exam.  Blinks to visual threat on the right only.  Left gaze preference.  Not tracking on the right.  Left upper extremity spontaneous movement 4/5.  Weak withdraw to left lower extremity.  Right upper extremity flicker.   Continues to require low-dose norepinephrine. Increase urine output (1,230 cc in the past 24 hours).  Remains on lasix infusion.  Rising serum creatinine 5.09.  Liver function improving: AST 453, ALT 1,532 .  HR in the low 100-120 today.  Palliative care meeting today.        Vitals:   09/20/20 1141 09/20/20 1200 09/20/20 1300 09/20/20 1400  BP:  111/74 107/70 91/66  Pulse: 75 65 67 (!) 43  Resp: 15 13 12 13   Temp:  97.7 F (36.5 C)    TempSrc:  Axillary    SpO2: 100% 100% 100% 100%  Weight:      Height:       CBC:  Recent Labs  Lab 2020-10-14 0840 10-14-2020 0845 09/19/20 0529 09/20/20 0505  WBC 8.5   < > 9.2 6.5  NEUTROABS 4.8  --   --   --   HGB 14.7   < > 12.9* 12.9*  HCT 46.0   < > 38.4* 38.5*  MCV 93.3   < > 88.7 89.3  PLT 154   < > 106* 102*   < > = values in this interval not displayed.   Basic Metabolic Panel:  Recent Labs  Lab 09/18/20 0500 09/18/20 1321 09/18/20 1618 09/19/20 0529 09/19/20 1900 09/20/20 0505  NA 134*   < > 140   < > 140 140  K 4.7   < > 4.4   < > 4.2 4.2  CL 113*  --  114*   < > 109 107  CO2 10*  --  12*   < > 19* 19*  GLUCOSE 257*  --  267*   < > 151* 156*  BUN 76*  --  91*   < > 113* 122*  CREATININE 3.66*  --  4.08*   < > 4.85* 5.09*  CALCIUM 7.7*  --  7.7*   < > 7.5* 7.8*  MG 2.0  --  1.9  --   --   --   PHOS 5.7*  --  5.1*  --   --   --    < > = values in this interval not displayed.    Lipid Panel:  Recent Labs  Lab 09/17/20 0541 09/20/20 0505  CHOL 46  --   TRIG 63  62 73  HDL 24*  --   CHOLHDL 1.9  --   VLDL 13  --   LDLCALC 9  --     HgbA1c:   Recent Labs  Lab 09/17/20 0541  HGBA1C 6.7*   Urine Drug Screen: No results for input(s): LABOPIA, COCAINSCRNUR, LABBENZ, AMPHETMU, THCU, LABBARB in the last 168 hours.  Alcohol Level No results for input(s): ETH in the last 168 hours.  IMAGING  CT head:  Result date: 09/17/2020 IMPRESSION: Interval development of a small to moderate subarachnoid hemorrhage within the left sylvian fissure. Acute left frontal infarct involving the frontal operculum is seen with no significant associated mass effect or midline shift.  Stable senescent changes.  Remote right thalamic infarct.  DG Chest Port 1 View Result  Date: 09/18/2020 IMPRESSION: 1. Minimal left base/retrocardiac density unchanged and may be due to atelectasis or infection. 2.  Tubes and lines unchanged.   DG CHEST PORT 1 VIEW Result Date: 09/17/2020 IMPRESSION: Interval placement of right internal jugular catheter with distal tip in the expected position of the right atrium. No pneumothorax is noted. Otherwise stable support apparatus.  CT head Result date: 09/22/20 IMPRESSION: 1. Area of grey-white loss and hypoattenuation in the left parieto-occipital region is age-indeterminant without priors, but favored remote given suggestion of cortical volume loss.  2. No acute hemorrhage.  CT angio head/neck Result date: 2020/09/22 IMPRESSION: 1. Occlusion versus high-grade stenosis of a proximal left M2 MCA branch (series 11, image 84; series 12, image 89; series 10, image 150). 2. 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 3. Mild to moderate stenosis of bilateral intradural vertebral arteries. 4. Ground-glass opacities in the dependent lungs bilaterally  Sep 22, 2020 Echo 1. Left ventricular ejection fraction, by estimation, is <20%. The left  ventricle has severely decreased function. The left ventricle demonstrates  global hypokinesis. The left ventricular internal cavity size was severely  dilated. Left  ventricular  diastolic function could not be evaluated.  2. Right ventricular systolic function is severely reduced. The right  ventricular size is mildly enlarged. There is severely elevated pulmonary  artery systolic pressure. The estimated right ventricular systolic  pressure is 62.9 mmHg.  3. Left atrial size was severely dilated.  4. Right atrial size was severely dilated.  5. The mitral valve is normal in structure. Mild mitral valve  regurgitation. No evidence of mitral stenosis.  6. Tricuspid valve regurgitation is moderate.  7. The aortic valve is tricuspid. There is mild calcification of the  aortic valve. Aortic valve regurgitation is not visualized. Mild aortic  valve sclerosis is present, with no evidence of aortic valve stenosis.  8. The inferior vena cava is dilated in size with <50% respiratory  variability, suggesting right atrial pressure of 15 mmHg  PHYSICAL EXAM General: Elderly male, lying in the bed, orally intubated HENT: orally intubated, ETT and OGT in place Pulm: bilateral fine crackles, no rhonchi or wheezes Card: tachy irreg w/ afib, no murmur abd: soft, nontender, nondistended, bowel sounds present Ext: Weak pulses  Neurological Exam : opens eyes to voice.  Blinks to visual threat on the left only.  Left gaze preference.  Not tracking on the right.  Left upper extremity spontaneous movement 4/5.  Weak withdraw to left lower extremity.  Right side hemiplegia.  ASSESSMENT/PLAN [name] is a 75 y.o. male with history of  Afib on Xarelto(last dose in AM on 09/15/20), CKD 3, COPD, dilated cardiomyopathy, HLD, HTN, ICD, DM2, Vtach who woke up fine at 0500. Last seen by family at 0630 on 09-22-20. Found down at 0700. Moaning. Immediately called 911 and he was brought in by EMS as a stroke code for R hemiplegia, aphasia, R facial droop.  Stroke:  Acute left MCA stroke due to left M2 occlusion s/p IR with TICI 2b with post op SAH, embolic, likely due  to afib off AC vs. Severe cardiomyopathy vs. COVID related hypercoagulable state   Code stroke CT head: Area of grey-white loss and hypoattenuation in the left parieto-occipital region   CTA head/neck: Occlusion versus high-grade stenosis of a proximal left M2 MCA branch  MRI  : unable to obtain d/t ICD  S/p IR with TICI 2b reperfusion  CT head (09/17/2020): SAH within the left sylvian fissure. Acute left frontal infarct involving  the frontal operculum  2D Echo   09/30/2020: EF <20%, left ventricle has severely decreased function, global hypokinesis. Left atrial size was severely dilated. No intracardiac source of embolism detected   LDL 9  HgbA1c 6.7  VTE prophylaxis - heparin subq  Diet: NPO  Xarelto (rivaroxaban) daily prior to admission, now on No antithrombotic due to [facility].    Therapy recommendations:  pending  Disposition: pt critically ill, with multiorgan failure, palliative care on board, family meeting today 4 PM  Left sylvian fissure SAH  SBP goal <160 to prevent further hemorrhage  Hold chemical DVT prophylaxis for now  Serial neuro checks  Sodium goal >135  Chronic afib w/ RVR   Cardiology on board, amiodarone d/c due to liver failure  Holding off AC in the setting of SAH  Heart failure  Cardiogenic shock  Echo: EF <20%, left ventricle has severely decreased function, left ventricle demonstrates global hypokinesis  Cardiology on board   IV Lasix infusion 8mg /hr in the setting of cardiogenic shock, liver failure, and worsening renal failure  Respiratory Failure Septic shock  Due to acute stroke, acute pulmonary edema, and gram-positive cocci of pneumonia  Full vent support  Sputum culture (3/31): gram-positive cocci   Ceftriaxone 2g q 24hrs  WBC 13.7->12.4->9.2->6.5  CCM managing  Acute on chronic renal failure  ATN from cardiorenal syndrome  Creatinine level 1.7->2.31->3.07->3.66->4.58->5.09   Increasing urine output  Lasix infusion  8mg /hr  Continue to monitor uop  Liver failure   AST 4269-> 453  ALT 2834->1,532  Amiodarone discontinued  CCM on board  Continue monitoring  COVID infection   3/31 PCR + for COVID  Respiratory failure with intubation  Supportive care  Hypertension history hypotension  Home meds:  lisinopril  Currently hypotensive requiring low-dose levophed  SBP goal <160 in the setting of SAH  MAP goal >65 . Long-term BP goal normotensive  Hyperlipidemia  Home meds:  atorvastatin,  resumed in hospital  LDL 9, goal < 70  No statin now due to extremely low LDL and liver failure  Diabetes type II Controlled  Home meds:  8 units insulin at 10pm  Lantus to 20 units daily  HgbA1c 6.7, goal < 7.0  Currently hyperglycemia  CBGs  SSI  Other Stroke Risk Factors   Advanced Age >/= 50    Coronary artery disease   Congestive heart failure   ATTENDING NOTE: I reviewed above note and agree with the assessment and plan. Pt was seen and examined.   Pt stillintubated on sedation, eyesopen on voice,able to track on the left but not on the right,notfollowing commands. With eye opening, eyes in left gazeposition, not cross midline, notblinking to visual threat on the right, doll's eyespresent, PERRL. Corneal reflexpresent, gag and coughpresent. Breathing over the vent. Facial symmetry not able to test due to ET tube. Tongue protrusion not cooperative. Pt spontaneous movement on the left UE against gravity and withdraw 3-/5 on LLE, but no movement on the right UE and slight withdraw on the right LE.No babinski. Sensation, coordination and gait not tested.  Patient mental status seems slightly improved from yesterday, able to open eyes on voice, able to tracking to the right, however still has significant neuro deficit.  Patient creatinine still elevated but seems near plateau, urine output increasing, LFT improving.  BP stable with Lasix drip and Levophed.  Still has A.  fib RVR.  Patient medical condition seem to be stabilizing, however, patient still has significant neuro deficit.  Palliative care is  on board, has family meeting for p.m. scheduled.  For detailed assessment and plan, please refer to above as I have made changes wherever appropriate.   Marvel Plan, MD PhD Stroke Neurology 09/20/2020 3:41 PM  This patient is critically ill due to left MCA stroke, stat post thrombectomy, right hemiplegia, cardiogenic shock, septic shock, renal failure, shock liver, respiratory failure and at significant risk of neurological worsening, death form multiorgan failure, recurrent stroke, hemorrhagic conversion. This patient's care requires constant monitoring of vital signs, hemodynamics, respiratory and cardiac monitoring, review of multiple databases, neurological assessment, discussion with family, other specialists and medical decision making of high complexity. I spent 35 minutes of neurocritical care time in the care of this patient.  I also discussed with Dr. Merrily Pew CCM and Clerance Lav from palliative care.   To contact Stroke Continuity provider, please refer to [email or web address]. After hours, contact General Neurology

## 2020-09-20 NOTE — Plan of Care (Incomplete)
Pt still intubated on sedation, eyesopen on voice, able to track on the left but not on the right,notfollowing commands. With eye opening, eyes in left gazeposition, not cross midline, notblinking to visual threat on the right, doll's eyespresent, PERRL. Corneal reflexpresent, gag and coughpresent. Breathing over the vent. Facial symmetry not able to test due to ET tube. Tongue protrusion not cooperative. Pt spontaneous movement on the left UE against gravity and withdraw 3-/5 on LLE, but no movement on the right UE and slight withdraw on the right LE.No babinski. Sensation, coordination and gait not tested.

## 2020-09-21 DIAGNOSIS — J810 Acute pulmonary edema: Secondary | ICD-10-CM | POA: Diagnosis not present

## 2020-09-21 DIAGNOSIS — J9601 Acute respiratory failure with hypoxia: Secondary | ICD-10-CM | POA: Diagnosis not present

## 2020-09-21 DIAGNOSIS — R570 Cardiogenic shock: Secondary | ICD-10-CM | POA: Diagnosis not present

## 2020-09-21 DIAGNOSIS — I63512 Cerebral infarction due to unspecified occlusion or stenosis of left middle cerebral artery: Secondary | ICD-10-CM | POA: Diagnosis not present

## 2020-09-21 DIAGNOSIS — Z7189 Other specified counseling: Secondary | ICD-10-CM

## 2020-09-21 DIAGNOSIS — Z515 Encounter for palliative care: Secondary | ICD-10-CM | POA: Diagnosis not present

## 2020-09-21 DIAGNOSIS — N179 Acute kidney failure, unspecified: Secondary | ICD-10-CM | POA: Diagnosis not present

## 2020-09-21 LAB — COMPREHENSIVE METABOLIC PANEL
ALT: 1109 U/L — ABNORMAL HIGH (ref 0–44)
AST: 249 U/L — ABNORMAL HIGH (ref 15–41)
Albumin: 2.1 g/dL — ABNORMAL LOW (ref 3.5–5.0)
Alkaline Phosphatase: 92 U/L (ref 38–126)
Anion gap: 14 (ref 5–15)
BUN: 133 mg/dL — ABNORMAL HIGH (ref 8–23)
CO2: 20 mmol/L — ABNORMAL LOW (ref 22–32)
Calcium: 8 mg/dL — ABNORMAL LOW (ref 8.9–10.3)
Chloride: 106 mmol/L (ref 98–111)
Creatinine, Ser: 5.06 mg/dL — ABNORMAL HIGH (ref 0.61–1.24)
GFR, Estimated: 11 mL/min — ABNORMAL LOW (ref >60)
Glucose, Bld: 146 mg/dL — ABNORMAL HIGH (ref 70–99)
Potassium: 4.2 mmol/L (ref 3.5–5.1)
Sodium: 140 mmol/L (ref 135–145)
Total Bilirubin: 1.7 mg/dL — ABNORMAL HIGH (ref 0.3–1.2)
Total Protein: 5.2 g/dL — ABNORMAL LOW (ref 6.5–8.1)

## 2020-09-21 LAB — GLUCOSE, CAPILLARY
Glucose-Capillary: 102 mg/dL — ABNORMAL HIGH (ref 70–99)
Glucose-Capillary: 104 mg/dL — ABNORMAL HIGH (ref 70–99)
Glucose-Capillary: 115 mg/dL — ABNORMAL HIGH (ref 70–99)
Glucose-Capillary: 124 mg/dL — ABNORMAL HIGH (ref 70–99)
Glucose-Capillary: 132 mg/dL — ABNORMAL HIGH (ref 70–99)
Glucose-Capillary: 138 mg/dL — ABNORMAL HIGH (ref 70–99)
Glucose-Capillary: 59 mg/dL — ABNORMAL LOW (ref 70–99)
Glucose-Capillary: 69 mg/dL — ABNORMAL LOW (ref 70–99)

## 2020-09-21 LAB — CBC
HCT: 37.9 % — ABNORMAL LOW (ref 39.0–52.0)
Hemoglobin: 12.8 g/dL — ABNORMAL LOW (ref 13.0–17.0)
MCH: 29.1 pg (ref 26.0–34.0)
MCHC: 33.8 g/dL (ref 30.0–36.0)
MCV: 86.1 fL (ref 80.0–100.0)
Platelets: 102 10*3/uL — ABNORMAL LOW (ref 150–400)
RBC: 4.4 MIL/uL (ref 4.22–5.81)
RDW: 14.9 % (ref 11.5–15.5)
WBC: 7.9 10*3/uL (ref 4.0–10.5)
nRBC: 0.3 % — ABNORMAL HIGH (ref 0.0–0.2)

## 2020-09-21 LAB — BRAIN NATRIURETIC PEPTIDE: B Natriuretic Peptide: 570.8 pg/mL — ABNORMAL HIGH (ref 0.0–100.0)

## 2020-09-21 MED ORDER — DEXTROSE 50 % IV SOLN
12.5000 g | INTRAVENOUS | Status: AC
Start: 2020-09-21 — End: 2020-09-21
  Administered 2020-09-21: 12.5 g via INTRAVENOUS
  Filled 2020-09-21: qty 50

## 2020-09-21 MED ORDER — METOLAZONE 5 MG PO TABS
5.0000 mg | ORAL_TABLET | Freq: Two times a day (BID) | ORAL | Status: DC
  Administered 2020-09-21: 5 mg via ORAL
  Filled 2020-09-21 (×5): qty 1

## 2020-09-21 MED ORDER — DEXTROSE 50 % IV SOLN
12.5000 g | INTRAVENOUS | Status: AC
  Administered 2020-09-21: 12.5 g via INTRAVENOUS

## 2020-09-21 NOTE — Progress Notes (Signed)
Nutrition Follow-up  DOCUMENTATION CODES:   Not applicable  INTERVENTION:   Monitor for diet advancement; supplement diet as appropriate   If NG tube placed and GOC include nutrition support recommend:  Osmolite 1.2 at 65 ml/h (1560 ml per day) 45 ml ProSource TF BID Provides 1952 kcal, 108 gm protein, 1265 ml free water daily   NUTRITION DIAGNOSIS:   Inadequate oral intake related to inability to eat as evidenced by NPO status. Ongoing.   GOAL:   Patient will meet greater than or equal to 90% of their needs Not met.   MONITOR:   Vent status,TF tolerance,Labs  REASON FOR ASSESSMENT:   Ventilator,Consult Enteral/tube feeding initiation and management  ASSESSMENT:   75 yo male admitted with acute L MCA stroke, S/P thrombectomy with hemorrhagic conversion and SAH. PMH includes HF, chronic A fib with RVR, CKD, COPD, dilated cardiomyopathy, CAD, HLD, HTN, DM-2, ICD.  Pt discussed during ICU rounds and with RN.  Per RN plan for one-way extubation and if fails plan for comfort care.   Palliative care following  4/5 one-way extubation to 2L O2 via Kay, OG removed   Medications reviewed and include: colace, SSI, lantus, protonix, miralax, lasix Levophed @ 9 mcg  Labs reviewed: AST: 249, ALT: 1109 CBG's: 124-138   Diet Order:   Diet Order            Diet NPO time specified  Diet effective now                 EDUCATION NEEDS:   Not appropriate for education at this time  Skin:  Skin Assessment: Skin Integrity Issues: Skin Integrity Issues:: Incisions Incisions: groin  Last BM:  4/1  Height:   Ht Readings from Last 1 Encounters:  09/17/20 _0  (1.575 m)    Weight:   Wt Readings from Last 1 Encounters:  09/21/20 86.9 kg    Ideal Body Weight:  53.6 kg  BMI:  Body mass index is 35.04 kg/m.  Estimated Nutritional Needs:   Kcal:  1900-2100  Protein:  100-115 gm  Fluid:  >/= 1.8 L  Rosaleen Mazer P., RD, LDN, CNSC See AMiON for contact  information

## 2020-09-21 NOTE — Progress Notes (Signed)
Occupational Therapy Discharge Patient Details Name: Michael [name] MRN: [id number] DOB: Nov 28, 1945 Today's Date: 09/21/2020 Time:  -     Patient discharged from OT services secondary to medical decline - will need to re-order OT to resume therapy services.  Please see latest therapy progress note for current level of functioning and progress toward goals.    Progress and discharge plan discussed with patient and/or caregiver: Patient unable to participate in discharge planning and no caregivers available   Pt with one way extubation planned so OT will sign off at this time     Wynona Neat, OTR/L  Acute Rehabilitation Services Pager: [phone] Office: [phone] .  09/21/2020, 9:45 AM

## 2020-09-21 NOTE — Progress Notes (Addendum)
STROKE TEAM PROGRESS NOTE   INTERVAL HISTORY No acute events overnight.  No one is at the bedside.   Neurological exam: Opens eyes to voice.  Does not keep eyes open during exam.   Blinks to visual threat on the right only.  Left gaze preference.  Not tracking on the right.  Left upper extremity spontaneous movement 4/5.  Weak withdraw to left lower extremity.  Right upper extremity flicker.   Continues to require low-dose norepinephrine drip. Remains on lasix infusion.  Serum creatinine minimally changed since yesterday. Urine output 2150cc (stable). Remains elevated at 5.06. BNP 570.  Liver function improving: AST 249, ALT 1,109 .  HR remains in the low 100-120 today.    Goals of care ongoing with palliative team involved. Family asking for comfort care if patient unable to tolerate extubation.   Vitals:   09/21/20 0845 09/21/20 0900 09/21/20 0915 09/21/20 0930  BP: 104/80 107/66 97/73 104/71  Pulse: (!) 119 70 (!) 112 (!) 58  Resp: 14 15 13 17   Temp:      TempSrc:      SpO2: 100% 100% 100% 100%  Weight:      Height:       CBC:  Recent Labs  Lab 19-Sep-2020 0840 Sep 19, 2020 0845 09/20/20 0505 09/21/20 0440  WBC 8.5   < > 6.5 7.9  NEUTROABS 4.8  --   --   --   HGB 14.7   < > 12.9* 12.8*  HCT 46.0   < > 38.5* 37.9*  MCV 93.3   < > 89.3 86.1  PLT 154   < > 102* 102*   < > = values in this interval not displayed.   Basic Metabolic Panel:  Recent Labs  Lab 09/18/20 0500 09/18/20 1321 09/18/20 1618 09/19/20 0529 09/19/20 1900 09/20/20 0505  NA 134*   < > 140   < > 140 140  K 4.7   < > 4.4   < > 4.2 4.2  CL 113*  --  114*   < > 109 107  CO2 10*  --  12*   < > 19* 19*  GLUCOSE 257*  --  267*   < > 151* 156*  BUN 76*  --  91*   < > 113* 122*  CREATININE 3.66*  --  4.08*   < > 4.85* 5.09*  CALCIUM 7.7*  --  7.7*   < > 7.5* 7.8*  MG 2.0  --  1.9  --   --   --   PHOS 5.7*  --  5.1*  --   --   --    < > = values in this interval not displayed.    Lipid Panel:  Recent Labs   Lab 09/17/20 0541 09/20/20 0505  CHOL 46  --   TRIG 63  62 73  HDL 24*  --   CHOLHDL 1.9  --   VLDL 13  --   LDLCALC 9  --     HgbA1c:  Recent Labs  Lab 09/17/20 0541  HGBA1C 6.7*   Urine Drug Screen: No results for input(s): LABOPIA, COCAINSCRNUR, LABBENZ, AMPHETMU, THCU, LABBARB in the last 168 hours.  Alcohol Level No results for input(s): ETH in the last 168 hours.  IMAGING  CT head:  Result date: 09/17/2020 IMPRESSION: Interval development of a small to moderate subarachnoid hemorrhage within the left sylvian fissure. Acute left frontal infarct involving the frontal operculum is seen with no significant associated mass effect or  midline shift.  Stable senescent changes.  Remote right thalamic infarct.  DG Chest Port 1 View Result Date: 09/18/2020 IMPRESSION: 1. Minimal left base/retrocardiac density unchanged and may be due to atelectasis or infection. 2.  Tubes and lines unchanged.   DG CHEST PORT 1 VIEW Result Date: 09/17/2020 IMPRESSION: Interval placement of right internal jugular catheter with distal tip in the expected position of the right atrium. No pneumothorax is noted. Otherwise stable support apparatus.  CT head Result date: 2020/09/22 IMPRESSION: 1. Area of grey-white loss and hypoattenuation in the left parieto-occipital region is age-indeterminant without priors, but favored remote given suggestion of cortical volume loss.  2. No acute hemorrhage.  CT angio head/neck Result date: 09/22/2020 IMPRESSION: 1. Occlusion versus high-grade stenosis of a proximal left M2 MCA branch (series 11, image 84; series 12, image 89; series 10, image 150). 2. 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 3. Mild to moderate stenosis of bilateral intradural vertebral arteries. 4. Ground-glass opacities in the dependent lungs bilaterally  September 22, 2020 Echo 1. Left ventricular ejection fraction, by estimation, is <20%. The left  ventricle has severely  decreased function. The left ventricle demonstrates  global hypokinesis. The left ventricular internal cavity size was severely  dilated. Left ventricular  diastolic function could not be evaluated.  2. Right ventricular systolic function is severely reduced. The right  ventricular size is mildly enlarged. There is severely elevated pulmonary  artery systolic pressure. The estimated right ventricular systolic  pressure is 62.9 mmHg.  3. Left atrial size was severely dilated.  4. Right atrial size was severely dilated.  5. The mitral valve is normal in structure. Mild mitral valve  regurgitation. No evidence of mitral stenosis.  6. Tricuspid valve regurgitation is moderate.  7. The aortic valve is tricuspid. There is mild calcification of the  aortic valve. Aortic valve regurgitation is not visualized. Mild aortic  valve sclerosis is present, with no evidence of aortic valve stenosis.  8. The inferior vena cava is dilated in size with <50% respiratory  variability, suggesting right atrial pressure of 15 mmHg  PHYSICAL EXAM General: Elderly male, lying in the bed, orally intubated HENT: orally intubated, ETT and OGT in place Pulm: bilateral fine crackles, no rhonchi or wheezes Card: tachy irreg w/ afib, no murmur abd: soft, nontender, nondistended, bowel sounds present Ext: Weak pulses  Neurological Exam : Opens eyes to voice.  Blinks to visual threat on the left only.  Left gaze preference.  Not tracking on the right.  Left upper extremity spontaneous movement 4/5.  Weak withdraw to left lower extremity.  Right side hemiplegia.  ASSESSMENT/PLAN Mr. Ova Meegan is a 75 y.o. male with history of  Afib on Xarelto(last dose in AM on 09/15/20), CKD 3, COPD, dilated cardiomyopathy, HLD, HTN, ICD, DM2, Vtach who woke up fine at 0500. Last seen by family at 0630 on 09-22-20. Found down at 0700. Moaning. Immediately called 911 and he was brought in by EMS as a stroke code for R hemiplegia,  aphasia, R facial droop.  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 Severe cardiomyopathy vs. COVID related hypercoagulable state   Code stroke CT head: Area of grey-white loss and hypoattenuation in the left parieto-occipital region   CTA head/neck: Occlusion versus high-grade stenosis of a proximal left M2 MCA branch  MRI  : unable to obtain d/t ICD  S/p IR with  TICI 2b reperfusion  CT head (09/17/2020): SAH within the left sylvian fissure. Acute left frontal infarct involving the frontal operculum  2D Echo   09/15/2020: EF <20%, left ventricle has severely decreased function, global hypokinesis. Left atrial size was severely dilated. No intracardiac source of embolism detected   LDL 9  HgbA1c 6.7  VTE prophylaxis - heparin subq  Diet: NPO  Xarelto (rivaroxaban) daily prior to admission, now on No antithrombotic due to [facility].    Therapy recommendations:  pending  Disposition: pt critically ill, with multiorgan failure, palliative care on board, daughter requested one-way extubation   Left sylvian fissure SAH  SBP goal <160 to prevent further hemorrhage  Hold chemical DVT prophylaxis for now  Serial neuro checks  Sodium goal >135  Chronic afib w/ RVR   Cardiology on board, amiodarone d/c due to liver failure  Holding off AC in the setting of SAH  Heart failure  Cardiogenic shock  Echo: EF <20%, left ventricle has severely decreased function, left ventricle demonstrates global hypokinesis  Cardiology on board   IV Lasix infusion 8mg /hr in the setting of cardiogenic shock, liver failure, and worsening renal failure  CCM managing and appreciated  Respiratory Failure Septic shock  Due to acute stroke, acute pulmonary edema, and gram-positive cocci of pneumonia  Full vent support  Sputum culture (3/31): gram-positive cocci   Ceftriaxone 2g q 24hrs  WBC  13.7->12.4->9.2->6.5->7.9  CCM managing and appreciated  Acute on chronic renal failure  ATN from cardiorenal syndrome  Creatinine level 1.7->2.31->3.07->3.66->4.58->5.09->5.06  Stable urine output at 2100cc  Lasix infusion 8mg /hr  Continue to monitor uop  Liver failure   AST 4269-> 453->249  ALT 2834->1532->1109  Amiodarone discontinued  CCM on board  Continue monitoring  COVID infection   3/31 PCR + for COVID, asymptomatic and initially positive 3/20  Respiratory failure with intubation  Supportive care  Hypertension history hypotension  Home meds:  lisinopril  Currently hypotensive requiring low-dose levophed  SBP goal <160 in the setting of SAH  MAP goal >65 . Long-term BP goal normotensive  Hyperlipidemia  Home meds:  atorvastatin,  resumed in hospital  LDL 9, goal < 70  No statin now due to extremely low LDL and liver failure  Diabetes type II Controlled  Home meds:  8 units insulin at 10pm  Lantus to 20 units daily  HgbA1c 6.7, goal < 7.0  Currently hyperglycemia  CBGs  SSI  CCM managing   Other Stroke Risk Factors   Advanced Age >/= 32    Coronary artery disease   Congestive heart failure    This plan of care was directed by Dr. 4/20.  75, NP-C  ATTENDING NOTE: I reviewed above note and agree with the assessment and plan. Pt was seen and examined.   CCM Dr. Roda Shutters has discussed with daughter regarding patient current condition, treatment plan and prognosis.  Daughter requested one-way extubation.  Pt  was then one-way extubated this am, currently on 2L with 100% O2 sat, left eyeopen on voice,right eye ptosis with right surgical pupil. Very lethargic, left gaze preference, not tracking, seems followed commands with eye close/open and left hand squzzing,but notfollowing other commands. Notblinking to visual threat bilaterally, doll's eyes sluggish. Right facial droop and tongue protrusion not  cooperative. Pt spontaneous movement on the left UE against gravity and withdraw 2/5 on LLE, but no movement on the right UE and right LE.No babinski. Right UE edematous with oozing from skin breakdown. Sensation, coordination and gait  not tested.  Right after extubation, patient on 2 L since tolerating well at this time.  Continue neurochecks and supportive care.  Palliative care on board, will transition to full comfort care if patient declined.  For detailed assessment and plan, please refer to above as I have made changes wherever appropriate.   Marvel Plan, MD PhD Stroke Neurology 09/21/2020 10:40 PM  This patient is critically ill due to cardiogenic shock, septic shock, respiratory failure, shock liver, left MCA stroke with right hemiplegia status post thrombectomy and at significant risk of neurological worsening, death form multiorgan failure. This patient's care requires constant monitoring of vital signs, hemodynamics, respiratory and cardiac monitoring, review of multiple databases, neurological assessment, discussion with family, other specialists and medical decision making of high complexity. I spent 30 minutes of neurocritical care time in the care of this patient.  I have discussed with Dr. Merrily Pew CCM.   To contact Stroke Continuity provider, please refer to [email or web address]. After hours, contact General Neurology

## 2020-09-21 NOTE — Progress Notes (Signed)
RN called daughter, Viona Gilmore, to notify of planned extubation. She said she did not want to be bedside for extubation and does not plan to visit. Requests pt is made comfortable if death is imminent and for a notification call after TOD. RN reassured he will be kept comfortable.   Danajah Birdsell RN

## 2020-09-21 NOTE — Procedures (Signed)
Extubation Procedure Note  Patient Details:   Name: Michael [name] DOB: [birth year] MRN: [id number]   Airway Documentation:    Vent end date: 09/21/20 Vent end time: 1135   Evaluation  O2 sats: stable throughout Complications: No apparent complications Patient did tolerate procedure well. Bilateral Breath Sounds: Clear,Diminished   No   Patient extubated to a 2L [place]. Cuff leak was heard. No stridor noted. Daughter aware by RN of pt being extubated. RN at bedside with RT during extubation.  Darolyn Rua 09/21/2020, 11:47 AM

## 2020-09-21 NOTE — Progress Notes (Signed)
Shift summary: one-way extubation  N: UTA orientation, incomprehensible speech. Intermittently follows commands. R pupil 94mm and irregular shape, nonreactive. Afebrile. R sided paralysis. Fentanyl gtt discontinued for vent wean and extubation.  R: Extubated to [facility], can hear secretions during respirations, unable to suction.   CV: Afib controlled, BP WDL on levophed, no titrations needed. LUE radial doppler pulse. Weeping edema.   GI: tube feeds stopped shortly before extubation, OGT removed with ETT. Unable to assess swallow. No BM, BG covered with SS insulin.   GU: foley in place, adequate UOP with lasix gtt.   No new skin issues.   Appolonia Ackert RN

## 2020-09-21 NOTE — Progress Notes (Signed)
Daily Progress Note   Patient Name: Michael [name]       Date: 09/21/2020 DOB: 1946-06-13  Age: 75 y.o. MRN#: [id number] Attending Physician: Rosalin Hawking, MD Primary Care Physician: Lowella Dandy, NP Admit Date: 08/25/2020  Reason for Consultation/Follow-up: Establishing goals of care  Subjective: Medical records reviewed. Discussed with RN Ashlyn and learned that patient was extubated this morning, tolerated well. His daughter was made aware. Patient appears comfortable on 2L [place], although more tired today. He eventually opens his eyes to my voice.  I called patient's daughter Michael [name] as a follow up to yesterday's meeting. Provided an update on patient's extubation and informed her that he looked comfortable this afternoon. She is glad to hear this, as two other family members will be visiting today and she will not be able to see patient until tomorrow. Reviewed our previous conversation and she understands that while he may breath on his own for hours to days, anything can happen at any time. She confirms her decision is to transition to comfort care if it becomes evident that patient is suffering. I shared my hope that he will continue to breath well on this own.  Therapeutic listening was provided. Michael [name] shares that she is coping with the help of her best friend and husband. She is staying strong, as this is how the patient would have wanted her to be. Strength is a core value that the patient has passed on as his legacy. He is also a giving and faithful man who frequently offers help to the homeless and underserved.  Questions and concerns addressed. PMT will continue to support holistically.  Length of Stay: 5  Current Medications: Scheduled Meds:  .  stroke: mapping our early stages of  recovery book   Does not apply Once  . chlorhexidine gluconate (MEDLINE KIT)  15 mL Mouth Rinse BID  . Chlorhexidine Gluconate Cloth  6 each Topical Q0600  . docusate  100 mg Per Tube BID  . heparin injection (subcutaneous)  5,000 Units Subcutaneous Q8H  . insulin aspart  0-15 Units Subcutaneous Q4H  . insulin glargine  20 Units Subcutaneous Daily  . mouth rinse  15 mL Mouth Rinse 10 times per day  . metolazone  5 mg Oral BID  . pantoprazole sodium  40 mg Per Tube Q1200  .  polyethylene glycol  17 g Per Tube Daily  . sodium chloride flush  3 mL Intravenous Once    Continuous Infusions: . sodium chloride Stopped (09/20/20 1459)  . cefTRIAXone (ROCEPHIN)  IV Stopped (09/21/20 1054)  . feeding supplement (VITAL AF 1.2 CAL) Stopped (09/21/20 1200)  . fentaNYL infusion INTRAVENOUS Stopped (09/21/20 0752)  . furosemide (LASIX) 200 mg in dextrose 5% 100 mL (47m/mL) infusion 8 mg/hr (09/21/20 1310)  . norepinephrine (LEVOPHED) Adult infusion 9 mcg/min (09/21/20 1300)    PRN Meds: acetaminophen **OR** acetaminophen (TYLENOL) oral liquid 160 mg/5 mL **OR** acetaminophen, fentaNYL, fentaNYL (SUBLIMAZE) injection, midazolam, midazolam, ondansetron (ZOFRAN) IV, senna-docusate  Physical Exam Vitals and nursing note reviewed.  Constitutional:      Appearance: He is ill-appearing.     Interventions: Nasal cannula in place.  Cardiovascular:     Rate and Rhythm: Tachycardia present.  Pulmonary:     Effort: Pulmonary effort is normal.  Neurological:     Mental Status: He is lethargic.             Vital Signs: BP 108/73   Pulse (!) 110   Temp 97.9 F (36.6 C) (Axillary)   Resp 14   Ht 5' 2"  (1.575 m)   Wt 86.9 kg   SpO2 100%   BMI 35.04 kg/m  SpO2: SpO2: 100 % O2 Device: O2 Device: Nasal Cannula O2 Flow Rate: O2 Flow Rate (L/min): 2 L/min  Intake/output summary:   Intake/Output Summary (Last 24 hours) at 09/21/2020 1401 Last data filed at 09/21/2020 1300 Gross per 24 hour  Intake  1689.89 ml  Output 3130 ml  Net -1440.11 ml   LBM: Last BM Date: 09/17/20 Baseline Weight: Weight: 75.9 kg Most recent weight: Weight: 86.9 kg       Palliative Assessment/Data: 10%     Patient Active Problem List   Diagnosis Date Noted  . Palliative care encounter   . Pulmonary edema   . Cardiogenic shock ([facility]   . Acute ischemic left MCA stroke ([facility] 09/07/2020  . Acute respiratory failure ([facility]   . Acute renal failure superimposed on stage 3b chronic kidney disease ([facility]   . Acute on chronic systolic (congestive) heart failure ([facility] 09/05/2020  . Coronary artery disease involving native coronary artery of native heart without angina pectoris 09/12/2019  . Former smoker 04/03/2019  . Mixed hyperlipidemia 04/03/2019  . Current use of long term anticoagulation 08/23/2017  . Chronic kidney disease, stage 3 unspecified ([facility] 12/03/2014  . Presence of automatic implantable cardioverter-defibrillator 12/03/2014  . Chronic atrial fibrillation ([facility] 12/02/2014  . Chronic systolic heart failure ([facility] 12/02/2014  . Dilated cardiomyopathy ([facility] 12/02/2014  . Ventricular tachycardia ([facility] 12/02/2014    Palliative Care Assessment & Plan   Patient Profile: 75y.o.malewith past medical history of atrial fibrillation, ventricular tachycardia, DM2, CHF EF 20-25%, CKD 3,who was admitted on 3/31/2022with right hemiplegia and facial droop. He was diagnosed with a large left MCA CVA. He underwent thrombectomy with stent. On 4/1 imaging revealed a new sub arachnoid hemorrhage and another stroke. His course has been complicated with afib w/RVR and cardiogenic shock. At the time of this consultation Mr. NHairis on the vent but has increased work of breathing. He is tachycardic 130s - 140s. His transaminases are in the thousands, and his creatinine has risen to 3.66. He is on pressors to maintain his blood pressure.  Assessment: Acute left MCA stroke SAH Chronic afib w/  RVR Cardiogenic shock Respiratory failure Heart failure Acute on chronic  renal failure Liver failure  Recommendations/Plan:  Continue with current interventions  Transition to full comfort and update daughter if patient declines  Psychosocial and emotional support provided  Ongoing support from PMT  Goals of Care and Additional Recommendations:  Limitations on Scope of Treatment: no reintubation  Code Status:    Code Status Orders  (From admission, onward)         Start     Ordered   09/17/20 1054  Do not attempt resuscitation (DNR)  Continuous        09/17/20 1053        Code Status History    Date Active Date Inactive Code Status Order ID Comments User Context   09/07/2020 1037 09/17/2020 1053 Full Code [id number]  Donnetta Simpers, MD ED   09/05/2020 1612 09/10/2020 1842 Full Code [id number]  Satira Sark, MD Inpatient   Advance Care Planning Activity       Prognosis:   Hours - Days poor long-term prognosis given multiorgan failure.  Discharge Planning:  Anticipated Hospital Death vs residential hospice if he declines  Care plan was discussed with RN Ailene Ards, daughter Michael [name].  Thank you for allowing the Palliative Medicine Team to assist in the care of this patient.   Time In: 1400 Time Out: 1425 Total Time 25 minutes Prolonged Time Billed  no     Greater than 50% of this time was spent counseling and coordinating care related to the above assessment and plan.  Aarini Slee Johnnette Litter, PA-C  Please contact Palliative Medicine Team phone at [phone] for questions and concerns.

## 2020-09-21 NOTE — Progress Notes (Signed)
   09/21/20 2349  Provider Notification  Provider Name/Title Dr. Arsenio Loader  Date Provider Notified 09/21/20  Time Provider Notified 2349  Notification Type Call Pola Corn)  Notification Reason Critical result  Test performed and critical result 2345  Date Critical Result Received 09/21/20  Time Critical Result Received 2345  Provider response Other (Comment) (waiting for orders. hypoglycemia protocol started)  Date of Provider Response 09/21/20  CBG 59

## 2020-09-21 NOTE — Progress Notes (Signed)
Pharmacist Heart Failure Core Measure Documentation  Assessment: Michael [name] has an EF documented as <20% on 08/30/2020 by ECHO.  Rationale: Heart failure patients with left ventricular systolic dysfunction (LVSD) and an EF < 40% should be prescribed an angiotensin converting enzyme inhibitor (ACEI) or angiotensin receptor blocker (ARB) at discharge unless a contraindication is documented in the medical record.  This patient is not currently on an ACEI or ARB for HF.  This note is being placed in the record in order to provide documentation that a contraindication to the use of these agents is present for this encounter.  ACE Inhibitor or Angiotensin Receptor Blocker is contraindicated (specify all that apply)  []   ACEI allergy AND ARB allergy []   Angioedema []   Moderate or severe aortic stenosis []   Hyperkalemia []   Hypotension []   Renal artery stenosis [x]   Worsening renal function, preexisting renal disease or dysfunction   , PharmD., BCPS, BCCCP Clinical Pharmacist Please refer to [facility] for unit-specific pharmacist

## 2020-09-21 NOTE — Progress Notes (Signed)
NAME:  Michael [name], MRN:  [id number], DOB:  02-07-1946, LOS: 5 ADMISSION DATE:  08/22/2020, CONSULTATION DATE:  3/31 REFERRING MD:  Stroke team Khaliqdina , CHIEF COMPLAINT:  Stroke, respiratory failure   History of Present Illness:  75 year old male w/ PMH as per below. Admitted w/ new right sided weakness and incoherent speech at 0630, LSN 0600 walking around in house. On arrival room air sats 60s.  Initial NIHS 24, had L gaze preference. CT and CTA obtained. Finding concerning for proximal left MCA Stroke. 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 3/31 admitted w/ new left MCA stroke right sided hemiparesis right sided facial droop and aphasia. Went emergently to IR, not IV TPA candidate due to xarelto. Wide complex tachycardia w/ PVCs while in CT. Cardiology consulted by EDP. Intubated for airway protection, Left Radial aline Placed by anesthesia. Underwent  mechanical thrombectomy and partial recannulization small device related dissection w/ SAH noted. Returned to ICU on vasopressin w/ goal BP 120-160. GCS 3 but sedated. Having runs of VT so amio bolus and infusion initiated.  ECHO Left ventricular ejection fraction, by estimation, is <20%. The LV severely dilated. SVR elevated PAP. RV size enlarged. RA size large.  ventricle has severely decreased function. Could not get MRI due to ICD.  Marland Kitchen 4/1 decreased UOP, got lasix. CT head showed interval small to [facility] w/in sylvian fissure and acute L frontal infarct no mass effect. Still in shock. Looking cardiogenic. Changing to norepi. Will wake up but has dense right sided hemiparesis. Continuing lasix. New shock liver.  Renal fxn worse. Asked Palliative to see. Made DNR. No HD  . 4/3 patient continued to require Levophed, remains oliguric with rising serum creatinine  Interim History / Subjective:  Patient kidney function continued to get worse, LFTs started improving.  He made 950 mL urine in last 24 hours Overnight he became dyssynchronous with the vent, was started on midazolam infusion  Objective   Blood pressure 104/71, pulse (!) 58, temperature 97.6 F (36.4 C), temperature source Axillary, resp. rate 17, height 5\' 2"  (1.575 m), weight 86.9 kg, SpO2 100 %. CVP:  [7 mmHg-12 mmHg] 9 mmHg  Vent Mode: PSV;CPAP FiO2 (%):  [30 %] 30 % Set Rate:  [18 bmp] 18 bmp Vt Set:  [430 mL] 430 mL PEEP:  [5 cmH20] 5 cmH20 Plateau Pressure:  [6 cmH20-11 cmH20] 6 cmH20   Intake/Output Summary (Last 24 hours) at 09/21/2020 0955 Last data filed at 09/21/2020 0900 Gross per 24 hour  Intake 1665.71 ml  Output 2880 ml  Net -1214.29 ml   Filed Weights   09/19/20 0500 09/20/20 0400 09/21/20 0500  Weight: 88.7 kg 91.5 kg 86.9 kg    Examination: General: Elderly male, lying in the bed, orally intubated HENT: NCAT, eyes closed, ETT and OGT in place Pulm: Reduced air entry bilaterally, with fine crackles, no rhonchi or wheezes Card: tachycardic, irregularly irregular, no murmur abd: soft, nontender, nondistended, bowel sounds present Ext cool. Weak pulses Neuro: Opens eyes with vocal stimulii. Moves the left side. Hemiparetic on the right. Purposeful PERRL  Labs/imaging that I havepersonally reviewed  (right  click and "Reselect all SmartList Selections" daily)  Serum creatinine 5.06, serum bicarbonate 20, AST > 1109, ALT 249  Resolved Hospital Problem list     Assessment & Plan:   Acute left MCA stroke suspect COVID related but also could be after being off [facility] for his ICD procedure.  now s/p mechanical thrombectomy and partial recannulization small device related dissection w/ SAH noted w/ some increase in size  and new left frontal infarct.   Cont serial neuro checks every hour PAD protocol RASS goal 0/-1 Holding statin due to elevated LFTs Holding systemic AC due to subarachnoid hemorrhage Management deferred to stroke team Discontinue midazolam and fentanyl infusion  Acute hypoxic respiratory failure  Due to acute stroke and acute pulmonary edema and gram-positive cocci of pneumonia Cont full vent support, remain on FiO2 30% PEEP of 5 Patient is tolerating pressure support trial, will give him a 1 week trial of extubation, if he fails then family is in agreement to proceed with comfort care VAP bundle Continue ceftriaxone to complete 7 days therapy  Chronic afib w/ RVR and freq PVCs and runs of VT.  Patient's heart rate is better controlled Con tele Can't start beta-blocker/calcium channel blocker due to cardiogenic shock and requirement of vasopressors  Cardiogenic shock EF now < 20% Shock liver Continue vasopressor support with Levophed, currently on 9 mics Continue IV Lasix infusion at 8 mg/h Hold spironolactone for now LFTs improving  Acute on chronic renal failure; CKD stage 3 due to ATN from cardiorenal syndrome Acute metabolic acidosis Patient serum creatinine has plateaued around 5 He made 2.5 L urine in last 24-hour Avoid hypotension Renal dose meds.  Strict I&O Serum bicarbonate improved to 20 Monitor chemistry  Fluid & Electrolyte imbalance w/ hyperkalemia, hyperchloremia,  NAG metabolic acidosis  Serum potassium is corrected to 4.2 Closely monitor electrolytes  COVID -19 positive. This was asymptomatic and present back on 3/20 from admit on 3/20 Watchful waiting  DM type II Fingersticks are not well controlled Increase Lantus to 20 units Continue SSI with a goal 140-180   Best Practice (right click and "Reselect all SmartList Selections" daily)   Diet:  Tube Feed  Pain/Anxiety/Delirium protocol (if indicated): Yes with midazolam and fentanyl infusion VAP  protocol (if indicated): Yes DVT prophylaxis: SCD GI prophylaxis: PPI Glucose control:  SSI Yes Central venous access: Yes needed Arterial line: No Foley:  Yes, and it is still needed Mobility:  bed rest  PT consulted: N/A Code Status:  DNR Prognosis: Life-threating POOR PROGNOSIS  Last date of multidisciplinary goals of care discussion: 10/5 Spoke with patient's daughter, she would like [place] to try to extubate and see if he can tolerate, if he fails then proceed with comfort care   Total critical care time: 32 minutes  Performed by: Cheri Fowler   Critical care time was exclusive of separately billable procedures and treating other patients.   Critical care was necessary to treat or prevent imminent or life-threatening deterioration.   Critical care was time spent personally by me on the following activities: development of treatment plan with patient and/or surrogate as well as nursing, discussions with consultants, evaluation of patient's response to treatment, examination of patient, obtaining history from patient or surrogate, ordering and performing treatments and interventions, ordering and review of laboratory studies, ordering and review of radiographic studies, pulse oximetry and re-evaluation of patient's condition.   Cheri Fowler MD [facility] See Amion for pager If no response to pager, please call [phone]  until 7pm After 7pm, Please call E-link [phone]

## 2020-09-22 DIAGNOSIS — Z7189 Other specified counseling: Secondary | ICD-10-CM | POA: Diagnosis not present

## 2020-09-22 DIAGNOSIS — N179 Acute kidney failure, unspecified: Secondary | ICD-10-CM | POA: Diagnosis not present

## 2020-09-22 DIAGNOSIS — I63512 Cerebral infarction due to unspecified occlusion or stenosis of left middle cerebral artery: Secondary | ICD-10-CM | POA: Diagnosis not present

## 2020-09-22 DIAGNOSIS — Z66 Do not resuscitate: Secondary | ICD-10-CM

## 2020-09-22 DIAGNOSIS — J9601 Acute respiratory failure with hypoxia: Secondary | ICD-10-CM | POA: Diagnosis not present

## 2020-09-22 DIAGNOSIS — R570 Cardiogenic shock: Secondary | ICD-10-CM | POA: Diagnosis not present

## 2020-09-22 DIAGNOSIS — J810 Acute pulmonary edema: Secondary | ICD-10-CM | POA: Diagnosis not present

## 2020-09-22 LAB — COMPREHENSIVE METABOLIC PANEL
ALT: 832 U/L — ABNORMAL HIGH (ref 0–44)
AST: 128 U/L — ABNORMAL HIGH (ref 15–41)
Albumin: 2.3 g/dL — ABNORMAL LOW (ref 3.5–5.0)
Alkaline Phosphatase: 87 U/L (ref 38–126)
Anion gap: 16 — ABNORMAL HIGH (ref 5–15)
BUN: 138 mg/dL — ABNORMAL HIGH (ref 8–23)
CO2: 26 mmol/L (ref 22–32)
Calcium: 8.5 mg/dL — ABNORMAL LOW (ref 8.9–10.3)
Chloride: 97 mmol/L — ABNORMAL LOW (ref 98–111)
Creatinine, Ser: 4.61 mg/dL — ABNORMAL HIGH (ref 0.61–1.24)
GFR, Estimated: 13 mL/min — ABNORMAL LOW (ref >60)
Glucose, Bld: 133 mg/dL — ABNORMAL HIGH (ref 70–99)
Potassium: 4 mmol/L (ref 3.5–5.1)
Sodium: 139 mmol/L (ref 135–145)
Total Bilirubin: 1.9 mg/dL — ABNORMAL HIGH (ref 0.3–1.2)
Total Protein: 5.5 g/dL — ABNORMAL LOW (ref 6.5–8.1)

## 2020-09-22 LAB — GLUCOSE, CAPILLARY
Glucose-Capillary: 104 mg/dL — ABNORMAL HIGH (ref 70–99)
Glucose-Capillary: 107 mg/dL — ABNORMAL HIGH (ref 70–99)
Glucose-Capillary: 118 mg/dL — ABNORMAL HIGH (ref 70–99)
Glucose-Capillary: 119 mg/dL — ABNORMAL HIGH (ref 70–99)
Glucose-Capillary: 132 mg/dL — ABNORMAL HIGH (ref 70–99)
Glucose-Capillary: 59 mg/dL — ABNORMAL LOW (ref 70–99)
Glucose-Capillary: 81 mg/dL (ref 70–99)

## 2020-09-22 MED ORDER — MIDODRINE HCL 5 MG PO TABS: 20.0000 mg | ORAL_TABLET | Freq: Three times a day (TID) | ORAL | Status: DC

## 2020-09-22 MED ORDER — DEXTROSE 50 % IV SOLN
12.5000 g | INTRAVENOUS | Status: AC
  Administered 2020-09-22: 12.5 g via INTRAVENOUS
  Filled 2020-09-22: qty 50

## 2020-09-22 MED ORDER — INSULIN GLARGINE 100 UNIT/ML ~~LOC~~ SOLN
10.0000 [IU] | Freq: Every day | SUBCUTANEOUS | Status: DC
  Administered 2020-09-22: 10 [IU] via SUBCUTANEOUS
  Filled 2020-09-22: qty 0.1

## 2020-09-22 MED ORDER — DEXTROSE 10 % IV SOLN: INTRAVENOUS | Status: DC

## 2020-09-22 MED ORDER — FUROSEMIDE 10 MG/ML IJ SOLN
60.0000 mg | Freq: Two times a day (BID) | INTRAMUSCULAR | Status: DC
  Administered 2020-09-22 – 2020-09-23 (×3): 60 mg via INTRAVENOUS
  Filled 2020-09-22 (×3): qty 6

## 2020-09-22 MED ORDER — PANTOPRAZOLE SODIUM 40 MG IV SOLR
40.0000 mg | Freq: Every day | INTRAVENOUS | Status: DC
  Administered 2020-09-22 – 2020-09-23 (×2): 40 mg via INTRAVENOUS
  Filled 2020-09-22 (×2): qty 40

## 2020-09-22 MED ORDER — INSULIN GLARGINE 100 UNIT/ML ~~LOC~~ SOLN
5.0000 [IU] | Freq: Every day | SUBCUTANEOUS | Status: DC
  Administered 2020-09-23: 5 [IU] via SUBCUTANEOUS
  Filled 2020-09-22: qty 0.05

## 2020-09-22 NOTE — Progress Notes (Addendum)
STROKE TEAM PROGRESS NOTE   INTERVAL HISTORY Extubated 1135 yesterday. Hypoglycemic to (BG 59) overnight. D10 infusion started. Currently with no feeding tube/access.  No one is at the bedside.    Neurological exam: Opens eyes to voice.  Does not keep eyes open during exam.   Blinks to visual threat on the right only.  Left gaze preference.  Not tracking on the right.  Left upper extremity spontaneous movement 4/5.  Weak withdraw to left lower extremity.  Right upper extremity flicker.   Continues to require low-dose norepinephrine drip. Remains on lasix infusion.  Serum creatinine with downtrend to 4.61. Urine output increased at 3850cc.  Liver function improving: AST 128, ALT 832 .  HR tachy and brady remains in the low 100s currently.    Contacted Michael [name] with palliative care. She is unavailable today. Spoke daughter, Michael [name], by phone. I updated her on patient's status and explained that patient had experienced low blood glucose overnight and the need for tube placement for feeding/medications. She declined this measure stating patient would not have wanted it. She asks that we give him a chance to wake up and start eating on his own but if he is not improving after being given some time  she is okay with proceeding to comfort care. She states her father was not the kind of person that would want to live dependent on others and never wanted to be in a nursing home.    Vitals:   09/22/20 0300 09/22/20 0400 09/22/20 0500 09/22/20 0600  BP: 99/70 108/77 103/76 109/88  Pulse: (!) 102 73 (!) 101 (!) 101  Resp: 15 17 (!) 22 20  Temp:  97.9 F (36.6 C)    TempSrc:  Axillary    SpO2: 100% 100% 100% 100%  Weight:      Height:       CBC:  Recent Labs  Lab 10-04-2020 0840 10-04-2020 0845 09/20/20 0505 09/21/20 0440  WBC 8.5   < > 6.5 7.9  NEUTROABS 4.8  --   --   --   HGB 14.7   < > 12.9* 12.8*  HCT 46.0   < > 38.5* 37.9*  MCV 93.3   < > 89.3 86.1  PLT 154   < > 102* 102*   < > =  values in this interval not displayed.   Basic Metabolic Panel:  Recent Labs  Lab 09/18/20 0500 09/18/20 1321 09/18/20 1618 09/19/20 0529 09/20/20 0505 09/21/20 0752  NA 134*   < > 140   < > 140 140  K 4.7   < > 4.4   < > 4.2 4.2  CL 113*  --  114*   < > 107 106  CO2 10*  --  12*   < > 19* 20*  GLUCOSE 257*  --  267*   < > 156* 146*  BUN 76*  --  91*   < > 122* 133*  CREATININE 3.66*  --  4.08*   < > 5.09* 5.06*  CALCIUM 7.7*  --  7.7*   < > 7.8* 8.0*  MG 2.0  --  1.9  --   --   --   PHOS 5.7*  --  5.1*  --   --   --    < > = values in this interval not displayed.    Lipid Panel:  Recent Labs  Lab 09/17/20 0541 09/20/20 0505  CHOL 46  --   TRIG 63  62 73  HDL 24*  --   CHOLHDL 1.9  --   VLDL 13  --   LDLCALC 9  --     HgbA1c:  Recent Labs  Lab 09/17/20 0541  HGBA1C 6.7*   Urine Drug Screen: No results for input(s): LABOPIA, COCAINSCRNUR, LABBENZ, AMPHETMU, THCU, LABBARB in the last 168 hours.  Alcohol Level No results for input(s): ETH in the last 168 hours.  IMAGING  CT head:  Result date: 09/17/2020 IMPRESSION: Interval development of a small to moderate subarachnoid hemorrhage within the left sylvian fissure. Acute left frontal infarct involving the frontal operculum is seen with no significant associated mass effect or midline shift.  Stable senescent changes.  Remote right thalamic infarct.  DG Chest Port 1 View Result Date: 09/18/2020 IMPRESSION: 1. Minimal left base/retrocardiac density unchanged and may be due to atelectasis or infection. 2.  Tubes and lines unchanged.   DG CHEST PORT 1 VIEW Result Date: 09/17/2020 IMPRESSION: Interval placement of right internal jugular catheter with distal tip in the expected position of the right atrium. No pneumothorax is noted. Otherwise stable support apparatus.  CT head Result date: 30-Sep-2020 IMPRESSION: 1. Area of grey-white loss and hypoattenuation in the left parieto-occipital region is age-indeterminant  without priors, but favored remote given suggestion of cortical volume loss.  2. No acute hemorrhage.  CT angio head/neck Result date: 09-30-20 IMPRESSION: 1. Occlusion versus high-grade stenosis of a proximal left M2 MCA branch (series 11, image 84; series 12, image 89; series 10, image 150). 2. 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 3. Mild to moderate stenosis of bilateral intradural vertebral arteries. 4. Ground-glass opacities in the dependent lungs bilaterally  30-Sep-2020 Echo 1. Left ventricular ejection fraction, by estimation, is <20%. The left  ventricle has severely decreased function. The left ventricle demonstrates  global hypokinesis. The left ventricular internal cavity size was severely  dilated. Left ventricular  diastolic function could not be evaluated.  2. Right ventricular systolic function is severely reduced. The right  ventricular size is mildly enlarged. There is severely elevated pulmonary  artery systolic pressure. The estimated right ventricular systolic  pressure is 62.9 mmHg.  3. Left atrial size was severely dilated.  4. Right atrial size was severely dilated.  5. The mitral valve is normal in structure. Mild mitral valve  regurgitation. No evidence of mitral stenosis.  6. Tricuspid valve regurgitation is moderate.  7. The aortic valve is tricuspid. There is mild calcification of the  aortic valve. Aortic valve regurgitation is not visualized. Mild aortic  valve sclerosis is present, with no evidence of aortic valve stenosis.  8. The inferior vena cava is dilated in size with <50% respiratory  variability, suggesting right atrial pressure of 15 mmHg  PHYSICAL EXAM General: Elderly male, lying in the bed on oxygen by nasal cannula,  restlessly moving in bed Pulm: Resp mild labored with audible congestion, bilateral fine crackles, no rhonchi or wheezes,  Card: tachy irreg w/ afib, no murmur abd: soft, nontender,  nondistended, bowel sounds present Ext: Weak pulses  Neurological Exam : Opens eyes to voice.  Blinks to visual threat on the left only.  Left gaze preference.  Not tracking on the right.  Left upper extremity spontaneous movement 4/5.  Weak withdraw to left lower extremity.  Right side hemiplegia.  ASSESSMENT/PLAN Michael [name] is a 75 y.o. male with history of  Afib on Xarelto(last dose in AM on 09/15/20), CKD 3, COPD, dilated cardiomyopathy, HLD, HTN, ICD, DM2, Vtach who woke up  fine at 0500. Last seen by family at 0630 on 2020/10/07. Found down at 0700. Moaning. Immediately called 911 and he was brought in by EMS as a stroke code for R hemiplegia, aphasia, R facial droop.  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 Severe cardiomyopathy vs. COVID related hypercoagulable state   Code stroke CT head: Area of grey-white loss and hypoattenuation in the left parieto-occipital region   CTA head/neck: Occlusion versus high-grade stenosis of a proximal left M2 MCA branch  MRI  : unable to obtain d/t ICD  S/p IR with TICI 2b reperfusion  CT head (09/17/2020): SAH within the left sylvian fissure. Acute left frontal infarct involving the frontal operculum  2D Echo   2020-10-07: EF <20%, left ventricle has severely decreased function, global hypokinesis. Left atrial size was severely dilated. No intracardiac source of embolism detected   LDL 9  HgbA1c 6.7  VTE prophylaxis - heparin subq  Diet: NPO  Xarelto (rivaroxaban) daily prior to admission, now on No antithrombotic due to [facility].    Therapy recommendations:  pending  Disposition: pt critically ill, with multiorgan failure, palliative care on board, daughter requested one-way extubation  Left sylvian fissure SAH  SBP goal <160 to prevent further hemorrhage  Serial neuro checks  Chronic afib w/ RVR   Cardiology on board, amiodarone d/c due to  liver failure  Holding off AC in the setting of SAH  Heart failure  Cardiogenic shock  Echo: EF <20%, left ventricle has severely decreased function, left ventricle demonstrates global hypokinesis  Cardiology on board   IV Lasix infusion transitioned to BID push in the setting of cardiogenic shock, liver failure, and worsening renal failure  CCM managing and appreciated  Respiratory Failure Septic shock  Due to acute stroke, acute pulmonary edema, and gram-positive cocci of pneumonia  Full vent support  Sputum culture (3/31): gram-positive cocci   Ceftriaxone 2g q 24hrs  WBC 13.7->12.4->9.2->6.5->7.9  CCM managing and appreciated  Extubated 09/21/20  Acute on chronic renal failure  ATN from cardiorenal syndrome  Creatinine level 1.7->2.31->3.07->3.66->4.58->5.09->5.06->4.61  Increased urine output   Lasix drip -> IVP BID  Continue to monitor uop  Liver failure   AST 4269-> 453->249->128  ALT 2834->1532->1109->832  Amiodarone discontinued  CCM on board  Continue monitoring  COVID infection   3/31 PCR + for COVID, asymptomatic and initially positive 3/20  Respiratory failure with intubation  Supportive care  Hypertension history hypotension  Home meds:  lisinopril  Currently hypotensive requiring low-dose levophed  SBP goal <160 in the setting of SAH  MAP goal >65 . Long-term BP goal normotensive  Hyperlipidemia  Home meds:  atorvastatin,  resumed in hospital  LDL 9, goal < 70  No statin now due to extremely low LDL and liver failure  Diabetes type II Controlled  Home meds:  8 units insulin at 10pm  Lantus to 20 units daily  HgbA1c 6.7, goal < 7.0  Currently hyperglycemia  CBGs  SSI  CCM managing   Other Stroke Risk Factors   Advanced Age >/= 45    Coronary artery disease   Congestive heart failure   Goals of Care   Palliative consult 4/4: If he starts to decline please shift quickly to full comfort and do not  allow Mr. Wicker to suffer  Daughter, Michael [name], is the decision maker  Spoke with Michael [name] today regarding  tube placement for medication and nutrition. She declined this and asks that we "give him a chance" to wake up and start eating on his own and if he does not or has any other decline (as with respiratory status) she is okay with transition to full comfort care. Discussed transitioning meds to IV route as appropriate with pharmacist, Romeo Apple.   This plan of care was directed by Dr. Roda Shutters.  Janey Genta, NP-C   ATTENDING NOTE: I reviewed above note and agree with the assessment and plan. Pt was seen and examined.   RN at bedside, patient intubated yesterday, so far tolerating well, on room air although still has mild SOB.  Neurological stable, open eyes on voice, nonverbal, not following commands.  Left gaze but able to track on the left visual field, right gaze palsy, right facial droop in the right upper extremity flaccid.  Right lower extremity 2+/5 on pain stimulation.  Left upper extremity spontaneously against gravity, left lower extremities 3/5 on pain supination.  Patient kidney function, liver function as well as leukocytosis continue to improve, still has copious urine output.  We will continue current management.  Per daughter and palliative care, if patient clinical deteriorated, may consider comfort care.  For detailed assessment and plan, please refer to above as I have made changes wherever appropriate.   Marvel Plan, MD PhD Stroke Neurology 09/22/2020 7:05 PM  This patient is critically ill due to left MCA infarct status post thrombectomy, cardiogenic shock, sepsis, shock liver, ATN and at significant risk of neurological worsening, death form multiorgan failure. This patient's care requires constant monitoring of vital signs, hemodynamics, respiratory and cardiac monitoring, review of multiple databases, neurological assessment, discussion with family, other specialists  and medical decision making of high complexity. I spent 35 minutes of neurocritical care time in the care of this patient.   To contact Stroke Continuity provider, please refer to [email or web address]. After hours, contact General Neurology

## 2020-09-22 NOTE — Progress Notes (Signed)
  Speech Language Pathology  Patient Details Name: Michael [name] MRN: [id number] DOB: 1946-01-22 Today's Date: 09/22/2020 Time:  -     Reviewed chart- was one way extubation. Per neuro note he followed commands intermittently. ST orders are for speech-cognitive language eval. Does not seem appropriate at present. ST will sign off however if he shows improvement, please reconsult and ST can evaluate.                       Royce Macadamia 09/22/2020, 4:22 PM

## 2020-09-22 NOTE — Progress Notes (Signed)
NAME:  Michael [name], MRN:  [id number], DOB:  [birth year], LOS: 6 ADMISSION DATE:  09/03/2020, CONSULTATION DATE:  3/31 REFERRING MD:  Stroke team Michael [name] , CHIEF COMPLAINT:  Stroke, respiratory failure   History of Present Illness:  75 year old male w/ PMH as per below. Admitted w/ new right sided weakness and incoherent speech at 0630, LSN 0600 walking around in house. On arrival room air sats 60s.  Initial NIHS 24, had L gaze preference. CT and CTA obtained. Finding concerning for proximal left MCA Stroke. 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 3/31 admitted w/ new left MCA stroke right sided hemiparesis right sided facial droop and aphasia. Went emergently to IR, not IV TPA candidate due to xarelto. Wide complex tachycardia w/ PVCs while in CT. Cardiology consulted by EDP. Intubated for airway protection, Left Radial aline Placed by anesthesia. Underwent  mechanical thrombectomy and partial recannulization small device related dissection w/ SAH noted. Returned to ICU on vasopressin w/ goal BP 120-160. GCS 3 but sedated. Having runs of VT so amio bolus and infusion initiated.  ECHO Left ventricular ejection fraction, by estimation, is <20%. The LV severely dilated. SVR elevated PAP. RV size enlarged. RA size large.  ventricle has severely decreased function. Could not get MRI due to ICD.  Marland Kitchen 4/1 decreased UOP, got lasix. CT head showed interval small to [facility] w/in sylvian fissure and acute L frontal infarct no mass effect. Still in shock. Looking cardiogenic. Changing to norepi. Will wake up but has dense right sided hemiparesis. Continuing lasix. New shock liver.  Renal fxn worse. Asked Palliative to see. Made DNR. No HD  . 4/3 patient continued to require Levophed, remains oliguric with rising serum creatinine . 4/5 extubated  Interim History / Subjective:  Michael [name] is making good amount of urine, and last 24-hour he made 3.8 L His serum creatinine and LFTs started improving  Objective   Blood pressure 93/78, pulse (!) 101, temperature 97.6 F (36.4 C), temperature source Axillary, resp. rate 14, height 5\' 2"  (1.575 m), weight 86.9 kg, SpO2 100 %. CVP:  [1 mmHg-44 mmHg] 5 mmHg      Intake/Output Summary (Last 24 hours) at 09/22/2020 1717 Last data filed at 09/22/2020 1500 Gross per 24 hour  Intake 1493.3 ml  Output 4525 ml  Net -3031.7 ml   Filed Weights   09/19/20 0500 09/20/20 0400 09/21/20 0500  Weight: 88.7 kg 91.5 kg 86.9 kg    Examination: General: Elderly male, lying in the bed HENT: NCAT, eyes closed, moist mucous membranes Pulm: Reduced air entry bilaterally, with fine crackles, no rhonchi or wheezes Card: tachycardic, irregularly irregular, no murmur abd: soft, nontender, nondistended, bowel sounds present Ext cool. Weak pulses Neuro: Opens eyes with vocal stimulii. Moves the left side. Hemiparetic on the right. Purposeful PERRL  Labs/imaging that I havepersonally reviewed  (right click and "Reselect all SmartList Selections" daily)  Serum creatinine 4.6, serum bicarbonate 26, AST > 832, ALT 128  Resolved Hospital Problem list   Acute metabolic acidosis Acute hypoxic respiratory failure  Assessment & Plan:   Acute left MCA stroke suspect COVID related but also  could be after being off [facility] for his ICD procedure.  now s/p mechanical thrombectomy and partial recannulization small device related dissection w/ SAH noted w/ some increase in size and new left frontal infarct.   Stroke team is following, continue secondary stroke prophylaxis PAD protocol RASS goal 0/-1 Holding statin due to elevated LFTs Holding systemic AC due to  subarachnoid hemorrhage Management deferred to stroke team  Acute hypoxic respiratory failure  Due to acute stroke and acute pulmonary edema and gram-positive cocci of pneumonia Patient was successfully extubated yesterday Patient's family does not want him to get reintubated Currently he is on room air with O2 sat > 95% Continue ceftriaxone to complete 7 days therapy  Chronic afib w/ RVR and freq PVCs and runs of VT.  Patient's heart rate is better controlled Can't start beta-blocker/calcium channel blocker due to cardiogenic shock and requirement of vasopressors  Cardiogenic shock EF now < 20% Shock liver Continue vasopressor support with Levophed Started on midodrine 3 times daily Discontinue Lasix infusion, started on Lasix 60 mg twice daily Continue metolazone twice daily Hold spironolactone for now LFTs improving  Acute on chronic renal failure; CKD stage 3 due to ATN from cardiorenal syndrome Patient serum creatinine started improving, down from 5-4.5 He made 3.8 L urine in last 24-hour Avoid hypotension Renal dose meds.  Strict I&O Monitor chemistry  Fluid & Electrolyte imbalance w/ hyperkalemia, hyperchloremia Serum potassium is corrected to 4.2 Closely monitor electrolytes  COVID -19 positive. This was asymptomatic and present back on 3/20 from admit on 3/20 Watchful waiting  DM type II Had an episode of hypoglycemia last night as tube feed was stopped, requiring D10 infusion Decrease Lantus to 5 units Discontinue D10W Continue SSI with a goal 140-180   Best Practice (right click and "Reselect all SmartList Selections" daily)   Diet: NPO, speech and swallow evaluation Pain/Anxiety/Delirium protocol (if indicated): N/A VAP protocol (if indicated): N/A DVT prophylaxis: SCD GI prophylaxis: PPI Glucose control:  SSI Yes Central venous access: Yes needed Arterial line: No Foley:  Yes, and it is still needed Mobility: As tolerated PT consulted: Yes Code  Status:  DNR/DNI Prognosis: Life-threating POOR PROGNOSIS  Last date of multidisciplinary goals of care discussion: 56/5 Spoke with patient's daughter, she would like [place] to try to extubate and see if he can tolerate, if he fails then proceed with comfort care   Total critical care time: 31 minutes  Performed by: Cheri Fowler   Critical care time was exclusive of separately billable procedures and treating other patients.   Critical care was necessary to treat or prevent imminent or life-threatening deterioration.   Critical care was time spent personally by me on the following activities: development of treatment plan with patient and/or surrogate as well as nursing, discussions with consultants, evaluation of patient's response to treatment, examination of patient, obtaining history from patient or surrogate, ordering and performing treatments and interventions, ordering and review of laboratory studies, ordering and review of radiographic studies, pulse oximetry and re-evaluation of patient's condition.   Cheri Fowler MD [facility] See Amion for pager If no response to pager, please call [phone] until 7pm After 7pm, Please call E-link [phone]

## 2020-09-22 NOTE — Progress Notes (Addendum)
eLink Physician-Brief Progress Note Patient Name: Michael [name] DOB: 1946-03-02 MRN: [id number]   Date of Service  09/22/2020  HPI/Events of Note  Hypoglycemia - Blood glucose = 59. Extubated and tube feeds stopped. Patient is NPO.   eICU Interventions  Plan: 1. Decrease Lantus from 20 units [place] per day to 10 units [place] per day. 2. D10W IV infusion to run at 50 mL/hour.     Intervention Category Major Interventions: Other:  Lister Brizzi Dennard Nip 09/22/2020, 12:01 AM

## 2020-09-22 NOTE — Progress Notes (Signed)
    Chart Reviewed. Updates Received. Patient assessed.   Somnolent but easily aroused. Recently extubated on 4/5. Now with episodes of hypoglycemia. No family at the bedside.   I spoke at length with daughter [name] regarding her father's current condition and poor prognosis. Extensive education on comfort and hypoglycemia in the setting of poor oral nutrition and inability to take in appropriate amounts given his current state. Recommendations to focus on patient's comfort and end-of-life. Daughter verbalized understanding and is clear that she does not wish for her father to suffer however, she also would like to allow him time and every opportunity to show some improvement if possible. She is declining [place] placement for nutrition and would like to see if he will show ability to take oral nutrition. Education provided emphasizing although she is remaining hopeful it is also necessary to be much prepared as previously discussed by my colleague. [name] verbalized understanding.   Daughter is clear in expressed goals to continue to treat and watchful waiting with hopes of some improvement/stability. She would not want her father to suffer and if he does not improve over the next 24-48hrs she would consider transitioning care to focus on his comfort for what time he has left.   All questions answered and support provided.   Assessment -somnolent, will arouse to verbal stimuli, NAD, ill-appearing -diminished bilaterally -tachycardic, Irregular -will move left side  Plan -Continue current plan of care -daughter requesting watchful waiting. Remains somewhat hopeful patient will show some ability to safely take oral nutrition. Extensive education on hypoglycemia and poor prognosis. Daughter is declining artificial feedings. Request to take it day by day with consideration of transitioning care to focus on his comfort if no improvement in the next 24-48 hrs, understands high risk of sudden  decline and/or death.  -PMT will continue to support and follow.   Time Total: 45 min.   Visit consisted of counseling and education dealing with the complex and emotionally intense issues of symptom management and palliative care in the setting of serious and potentially life-threatening illness.Greater than 50%  of this time was spent counseling and coordinating care related to the above assessment and plan.  Michael [name], AGPCNP-BC  Palliative Medicine Team [phone]

## 2020-09-23 ENCOUNTER — Other Ambulatory Visit: Payer: Self-pay

## 2020-09-23 DIAGNOSIS — N1832 Chronic kidney disease, stage 3b: Secondary | ICD-10-CM | POA: Diagnosis not present

## 2020-09-23 DIAGNOSIS — J9601 Acute respiratory failure with hypoxia: Secondary | ICD-10-CM | POA: Diagnosis not present

## 2020-09-23 DIAGNOSIS — R570 Cardiogenic shock: Secondary | ICD-10-CM | POA: Diagnosis not present

## 2020-09-23 DIAGNOSIS — Z7189 Other specified counseling: Secondary | ICD-10-CM | POA: Diagnosis not present

## 2020-09-23 DIAGNOSIS — I4891 Unspecified atrial fibrillation: Secondary | ICD-10-CM

## 2020-09-23 DIAGNOSIS — I63512 Cerebral infarction due to unspecified occlusion or stenosis of left middle cerebral artery: Secondary | ICD-10-CM | POA: Diagnosis not present

## 2020-09-23 DIAGNOSIS — N179 Acute kidney failure, unspecified: Secondary | ICD-10-CM | POA: Diagnosis not present

## 2020-09-23 LAB — BASIC METABOLIC PANEL
Anion gap: 16 — ABNORMAL HIGH (ref 5–15)
BUN: 137 mg/dL — ABNORMAL HIGH (ref 8–23)
CO2: 26 mmol/L (ref 22–32)
Calcium: 8.1 mg/dL — ABNORMAL LOW (ref 8.9–10.3)
Chloride: 96 mmol/L — ABNORMAL LOW (ref 98–111)
Creatinine, Ser: 4.16 mg/dL — ABNORMAL HIGH (ref 0.61–1.24)
GFR, Estimated: 14 mL/min — ABNORMAL LOW (ref >60)
Glucose, Bld: 94 mg/dL (ref 70–99)
Potassium: 3.7 mmol/L (ref 3.5–5.1)
Sodium: 138 mmol/L (ref 135–145)

## 2020-09-23 LAB — GLUCOSE, CAPILLARY
Glucose-Capillary: 125 mg/dL — ABNORMAL HIGH (ref 70–99)
Glucose-Capillary: 93 mg/dL (ref 70–99)
Glucose-Capillary: 97 mg/dL (ref 70–99)

## 2020-09-23 LAB — MAGNESIUM: Magnesium: 2 mg/dL (ref 1.7–2.4)

## 2020-09-23 MED ORDER — LORAZEPAM 2 MG/ML IJ SOLN
1.0000 mg | INTRAMUSCULAR | Status: DC | PRN
  Administered 2020-09-23 (×3): 1 mg via INTRAVENOUS
  Filled 2020-09-23 (×3): qty 1

## 2020-09-23 MED ORDER — BIOTENE DRY MOUTH MT LIQD: 15.0000 mL | OROMUCOSAL | Status: DC | PRN

## 2020-09-23 MED ORDER — HYDROMORPHONE HCL 1 MG/ML IJ SOLN
1.0000 mg | INTRAMUSCULAR | Status: DC | PRN
Start: 2020-09-23 — End: 2020-09-23

## 2020-09-23 MED ORDER — HYDROMORPHONE HCL 1 MG/ML IJ SOLN: 0.5000 mg | INTRAMUSCULAR | Status: DC | PRN

## 2020-09-23 MED ORDER — AMIODARONE HCL IN DEXTROSE 360-4.14 MG/200ML-% IV SOLN
30.0000 mg/h | INTRAVENOUS | Status: DC
  Filled 2020-09-23: qty 200

## 2020-09-23 MED ORDER — BISACODYL 10 MG RE SUPP: 10.0000 mg | Freq: Every day | RECTAL | Status: DC | PRN

## 2020-09-23 MED ORDER — AMIODARONE LOAD VIA INFUSION
150.0000 mg | Freq: Once | INTRAVENOUS | Status: AC
  Administered 2020-09-23: 150 mg via INTRAVENOUS
  Filled 2020-09-23: qty 83.34

## 2020-09-23 MED ORDER — AMIODARONE HCL IN DEXTROSE 360-4.14 MG/200ML-% IV SOLN
60.0000 mg/h | INTRAVENOUS | Status: DC
  Administered 2020-09-23 (×2): 60 mg/h via INTRAVENOUS
  Filled 2020-09-23 (×2): qty 200

## 2020-09-23 MED ORDER — HYDROMORPHONE HCL 1 MG/ML IJ SOLN
1.0000 mg | INTRAMUSCULAR | Status: DC
  Administered 2020-09-23 – 2020-09-25 (×9): 1 mg via INTRAVENOUS
  Filled 2020-09-23 (×9): qty 1

## 2020-09-23 MED ORDER — POLYVINYL ALCOHOL 1.4 % OP SOLN
1.0000 [drp] | Freq: Four times a day (QID) | OPHTHALMIC | Status: DC | PRN
  Filled 2020-09-23: qty 15

## 2020-09-23 MED ORDER — GLYCOPYRROLATE 0.2 MG/ML IJ SOLN
0.4000 mg | INTRAMUSCULAR | Status: DC | PRN
  Administered 2020-09-23 – 2020-09-27 (×7): 0.4 mg via INTRAVENOUS
  Filled 2020-09-23 (×8): qty 2

## 2020-09-23 MED ORDER — HYDROMORPHONE HCL 1 MG/ML IJ SOLN
0.5000 mg | INTRAMUSCULAR | Status: DC | PRN
Start: 2020-09-23 — End: 2020-09-29
  Administered 2020-09-23: 0.5 mg via INTRAVENOUS
  Filled 2020-09-23: qty 1

## 2020-09-23 NOTE — Progress Notes (Signed)
NAME:  Kendra Woolford, MRN:  [id number], DOB:  1945-11-30, LOS: 7 ADMISSION DATE:  Oct 07, 2020, CONSULTATION DATE:  3/31 REFERRING MD:  Stroke team Khaliqdina , CHIEF COMPLAINT:  Stroke, respiratory failure   History of Present Illness:  76 year old male w/ PMH as per below. Admitted w/ new right sided weakness and incoherent speech at 0630, LSN 0600 walking around in house. On arrival room air sats 60s.  Initial NIHS 24, had L gaze preference. CT and CTA obtained. Finding concerning for proximal left MCA Stroke. 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 3/31 admitted w/ new left MCA stroke right sided hemiparesis right sided facial droop and aphasia. Went emergently to IR, not IV TPA candidate due to xarelto. Wide complex tachycardia w/ PVCs while in CT. Cardiology consulted by EDP. Intubated for airway protection, Left Radial aline Placed by anesthesia. Underwent  mechanical thrombectomy and partial recannulization small device related dissection w/ SAH noted. Returned to ICU on vasopressin w/ goal BP 120-160. GCS 3 but sedated. Having runs of VT so amio bolus and infusion initiated.  ECHO Left ventricular ejection fraction, by estimation, is <20%. The LV severely dilated. SVR elevated PAP. RV size enlarged. RA size large.  ventricle has severely decreased function. Could not get MRI due to ICD.  Marland Kitchen 4/1 decreased UOP, got lasix. CT head showed interval small to [facility] w/in sylvian fissure and acute L frontal infarct no mass effect. Still in shock. Looking cardiogenic. Changing to norepi. Will wake up but has dense right sided hemiparesis. Continuing lasix. New shock liver.  Renal fxn worse. Asked Palliative to see. Made DNR. No HD  . 4/3 patient continued to require Levophed, remains oliguric with rising serum creatinine . 4/5 extubated . 4/7 started on amio for Afib RVR. respiratory status is marginal, palliative follow up is pending. Neuro status is pretty stable, but overall pt seems to be dwindling. Remains on pressors   Interim History / Subjective:  Started on amio overnight for Afib RVR.  Remains on NE  Cr at a plateau   Objective   Blood pressure (!) 130/110, pulse 66, temperature 98.1 F (36.7 C), temperature source Axillary, resp. rate 13, height 5\' 2"  (1.575 m), weight 86.9 kg, SpO2 100 %. CVP:  [0 mmHg-44 mmHg] 4 mmHg      Intake/Output Summary (Last 24 hours) at 09/23/2020 0708 Last data filed at 09/23/2020 0600 Gross per 24 hour  Intake 1308.74 ml  Output 4985 ml  Net -3676.26 ml   Filed Weights   09/19/20 0500 09/20/20 0400 09/21/20 0500  Weight: 88.7 kg 91.5 kg 86.9 kg    Examination: General: Chronically and acutely ill appearing older M, reclined in bed NAD  HENT: NCAT pink mm. Anicteric sclera. Trachea midline  Pulm: Wet upper airway sounds, scattered rhonchi, shallow unlabored respirations at slightly incr rate  Card: IRIR, s1s2 cap refill < 3, 1+ radial pulses  abd: Soft round ndnt + bowel sounds  Ext: no acute joint deformity, no cyanosis or clubbing.  Neuro: Lethargic. Weakly follows commands. Weak cough   Labs/imaging that I havepersonally reviewed  (right click and "Reselect all  SmartList Selections" daily)   BMP 4/7 -- BUN 137, Cr 4.16 AG 16  Resolved Hospital Problem list   Acute metabolic acidosis Acute hypoxic respiratory failure hyperkalemia   Assessment & Plan:    Acute encephalopathy -in setting of CVA, infection, shock, AKI, acute liver injury, possible ICU delirium P Delirium precautions Minimize CNS depressing meds Supportive care as below   Acute L MCA CVA s/p thrombectomy and partial  recannulization -likely due to being off anticoag for ICD placement but could be COVID related hypercoagulable state  Device related dissection, L sylvian fissure SAH  L frontal infarct  P Stoke team following Statin on hold  Systemic anticoag on hold due to SAH  SBP goal < 160   Acute hypoxic respiratory failure requiring intubation - improving  PNA, Pulmonary edema COVID-19 positive, asymptomatic  P No reintubation Pulm hygiene Rocephin x 7d course  Diuresis  Chronic afib w/ RVR and freq PVCs and runs of VT.  P Started back on amio 4/7 -- had been held previously with acute liver injury. LFTs downtrending, will cont amio for now Limited options -- cant really achieve rate control w CCBs or BBs due to shock Cont electrolyte optimization   Cardiogenic shock Possible component of septic shock in setting of PNA above  P NE for MAP > 65 TID midodrine  BID diuresis, BID metolazone  Abx as above   Shock liver , improving  P Trend LFTs  Supportive care, pressors as above   Acute on chronic renal failure CKD III  ATN, Cardiorenal syndrome  P No HD  Trend renal indices Supportive care  DM II  SSI + Basal    Goals of Care 75 yo M with CVA, multisystem organ dysfunction as outlined above -neuro status has been fairly stable but overall prognosis is guarded. Family has set some very reasonable limitations of care (DNR/I, no HD) but further goals of care are yet to be determined-- I see in 4/6 notes where family hoped to allow more time.  -4/7: I think the patient's body is declaring itself. His respiratory status is marginal and I think that it would be appropriate to consider symptom management for respiratory symptoms. Remains very weak -- I do not think he has shown improvements that would allow him to eat enough to recover. Remains pressor dependent with multisystem organ dysfunction. -I would recommend consideration of transition to comfort care  -Palliative plans to  speak with family 4/7  Best Practice (right click and "Reselect all SmartList Selections" daily)   Diet:  NPO Pain/Anxiety/Delirium protocol (if indicated): N/A VAP protocol (if indicated): N/A DVT prophylaxis: SCD GI prophylaxis: PPI Glucose control:  SSI Yes Central venous access: Yes needed Arterial line: No Foley:  Yes, and it is still needed Mobility: As tolerated PT consulted: Yes Code Status:  DNR/DNI Prognosis: Life-threating POOR PROGNOSIS  Last date of multidisciplinary goals of care discussion: 57/5 Spoke with patient's daughter, she would like [place] to try to extubate and see if he can tolerate, if he fails then proceed with comfort care  Palliative has been consulted   CRITICAL CARE Performed by: Lanier Clam   Total critical care time: 46 minutes  Critical care time was exclusive of separately billable procedures and treating other patients. Critical care was necessary to treat or prevent imminent or life-threatening deterioration.  Critical care was time spent personally by me on the following activities: development of treatment plan with patient and/or surrogate as well as nursing, discussions with  consultants, evaluation of patient's response to treatment, examination of patient, obtaining history from patient or surrogate, ordering and performing treatments and interventions, ordering and review of laboratory studies, ordering and review of radiographic studies, pulse oximetry and re-evaluation of patient's condition.

## 2020-09-23 NOTE — Progress Notes (Signed)
eLink Physician-Brief Progress Note Patient Name: Michael [name] DOB: April 11, 1946 MRN: [id number]   Date of Service  09/23/2020  HPI/Events of Note  AFIB with RVR - EKG reveals atrial fibrillation with rapid ventricular response Right bundle branch block , plus right ventricular hypertrophy. K+ = 4.0. Patient is currently on a Norepinephrine IV infusion.   eICU Interventions  Plan: 1. Mg++ level STAT. 2. Amiodarone IV load and infusion.      Intervention Category Major Interventions: Arrhythmia - evaluation and management  [name] [place] 09/23/2020, 6:09 AM

## 2020-09-23 NOTE — Progress Notes (Signed)
Daily Progress Note   Patient Name: Michael [name]       Date: 09/23/2020 DOB: 11-Oct-1945  Age: 75 y.o. MRN#: [id number] Attending Physician: Marvel Plan, MD Primary Care Physician: Hurshel Party, NP Admit Date: 09/15/2020  Reason for Consultation/Follow-up: Establishing goals of care  Subjective: Medical records reviewed. Discussed with RN Junious Dresser and my colleague Willette Alma NP. I assessed patient at the bedside. No family currently present. Pt is lethargic and opens eyes to voice,  showing no signs of strengthening or improved cognition.   Called patient's daughter Michael [name] to provide support and offer an update. Informed her of patient's afib with RVR requiring amiodarone, which she is familiar with as a chronic condition. Informed her of patient's increasing needs for pressors. Michael [name] was made aware that while patient was able to answer yes/no questions with nodding, he has not made any progress towards the alertness and strength required to eat and support himself naturally. I shared my honest opinion that Kylon would certainly need a nursing home for long-term care if we continue with current interventions.  Michael [name] remains clear that the patient would never want to be dependent on others for care. She understands that patient's need for blood pressure support and rhythm control are indications that he is declining. She is ready to transition to a full comfort path and she is at peace with this decision. She leans heavily on her family and best friend for support, having had several conversations over the past few days in anticipation of this likely outcome. She does understand that patient could pass at any time without current interventions. She plans to visit around 4-4:30pm  this afternoon and hopes that patient will survive until then.   I sought clarification as to whether she desired drips to be discontinued at this time and she confirmed. Michael [name] does not want the patient to suffer and would like to be notified of his peaceful death if she has not yet arrived. As during our discussion of extubation, she tells me that she does not want to see patient take his final breath. She is considering her father's comfort first and foremost, therefore she does not wish for him to remain on interventions for the sole purpose of her arrival.  Questions and concerns addressed. PMT will continue to support  holistically.  Length of Stay: 7  Current Medications: Scheduled Meds:  .  stroke: mapping our early stages of recovery book   Does not apply Once  . Chlorhexidine Gluconate Cloth  6 each Topical Q0600  .  HYDROmorphone (DILAUDID) injection  1 mg Intravenous Q3H  . mouth rinse  15 mL Mouth Rinse 10 times per day  . sodium chloride flush  3 mL Intravenous Once    Continuous Infusions: . sodium chloride Stopped (09/20/20 1459)    PRN Meds: acetaminophen **OR** acetaminophen (TYLENOL) oral liquid 160 mg/5 mL **OR** acetaminophen, antiseptic oral rinse, bisacodyl, glycopyrrolate, HYDROmorphone (DILAUDID) injection, LORazepam, ondansetron (ZOFRAN) IV, polyvinyl alcohol, senna-docusate  Physical Exam Vitals and nursing note reviewed.  Constitutional:      Appearance: He is ill-appearing.     Interventions: Nasal cannula in place.  Cardiovascular:     Rate and Rhythm: Tachycardia present. Rhythm irregular.  Pulmonary:     Effort: Pulmonary effort is normal. Tachypnea present.  Neurological:     Mental Status: He is lethargic.             Vital Signs: BP (!) 76/57   Pulse (!) 125   Temp 98.1 F (36.7 C) (Axillary)   Resp (!) 24   Ht 5\' 2"  (1.575 m)   Wt 86.9 kg   SpO2 (!) 86%   BMI 35.04 kg/m  SpO2: SpO2: (!) 86 % O2 Device: O2 Device: Room Air O2  Flow Rate: O2 Flow Rate (L/min): 2 L/min  Intake/output summary:   Intake/Output Summary (Last 24 hours) at 09/23/2020 1246 Last data filed at 09/23/2020 1100 Gross per 24 hour  Intake 1213.22 ml  Output 4025 ml  Net -2811.78 ml   LBM: Last BM Date: 09/17/20 Baseline Weight: Weight: 75.9 kg Most recent weight: Weight: 86.9 kg       Palliative Assessment/Data: 10%     Patient Active Problem List   Diagnosis Date Noted  . Goals of care, counseling/discussion   . Pulmonary edema   . Cardiogenic shock (HCC)   . Acute ischemic left MCA stroke (HCC) 09/08/2020  . Acute respiratory failure (HCC)   . Acute renal failure superimposed on stage 3b chronic kidney disease (HCC)   . Acute on chronic systolic (congestive) heart failure (HCC) 09/05/2020  . Coronary artery disease involving native coronary artery of native heart without angina pectoris 09/12/2019  . Former smoker 04/03/2019  . Mixed hyperlipidemia 04/03/2019  . Current use of long term anticoagulation 08/23/2017  . Chronic kidney disease, stage 3 unspecified (HCC) 12/03/2014  . Presence of automatic implantable cardioverter-defibrillator 12/03/2014  . Chronic atrial fibrillation (HCC) 12/02/2014  . Chronic systolic heart failure (HCC) 12/02/2014  . Dilated cardiomyopathy (HCC) 12/02/2014  . Ventricular tachycardia (HCC) 12/02/2014    Palliative Care Assessment & Plan   Patient Profile: 75 y.o.malewith past medical history of atrial fibrillation, ventricular tachycardia, DM2, CHF EF 20-25%, CKD 3,who was admitted on 3/31/2022with right hemiplegia and facial droop. He was diagnosed with a large left MCA CVA. He underwent thrombectomy with stent. On 4/1 imaging revealed a new sub arachnoid hemorrhage and another stroke. His course has been complicated with afib w/RVR and cardiogenic shock. At the time of this consultation Mr. Bergum is on the vent but has increased work of breathing. He is tachycardic 130s - 140s. His  transaminases are in the thousands, and his creatinine has risen to 3.66. He is on pressors to maintain his blood pressure.  Assessment: Acute left MCA stroke [facility]  Chronic afib w/ RVR Cardiogenic shock Respiratory failure Heart failure Acute on chronic renal failure Liver failure  Recommendations/Plan:  Transition to full comfort care and update daughter if patient dies before her arrival this afternoon Dilaudid scheduled and PRN for pain/air hunger/dyspnea/comfort Robinul PRN for excessive secretions Ativan PRN for agitation/anxiety Zofran PRN for nausea Liquifilm tears PRN for dry eyes, eye care and oral care for comfort Bisacodyl PRN constipation  Comfort cart for family, comfort screen on monitor Unrestricted visitations in the setting of EOL (per policy) Oxygen PRN 2L or less for comfort. No escalation.   Psychosocial and emotional support provided  Ongoing support from PMT  Goals of Care and Additional Recommendations:  Limitations on Scope of Treatment: Full Comfort Care  Code Status: DNR/DNI  Code Status Orders  (From admission, onward)         Start     Ordered   09/17/20 1054  Do not attempt resuscitation (DNR)  Continuous        09/17/20 1053        Code Status History    Date Active Date Inactive Code Status Order ID Comments User Context   08/28/2020 1037 09/17/2020 1053 Full Code [id number]  Erick Blinks, MD ED   09/05/2020 1612 09/10/2020 1842 Full Code [id number]  Jonelle Sidle, MD Inpatient   Advance Care Planning Activity     Prognosis:   Hours - Days poor long-term prognosis given multiorgan failure, afib with RVR, declining despite interventions.  Discharge Planning:  Anticipated Hospital Death   Care plan was discussed with RN Junious Dresser, daughter Michael [name], Dr. Roda Shutters, Dr. Merrily Pew, Tessie Fass NP, Shon Hale NP.   Thank you for allowing the Palliative Medicine Team to assist in the care of this patient.   Time In: 9:45  Time Out: 11:00 Total Time 75 minutes Prolonged Time Billed  yes    Greater than 50% of this time was spent counseling and coordinating care related to the above assessment and plan.  Christophe Rising Jeni Salles, PA-C  Please contact Palliative Medicine Team phone at [phone] for questions and concerns.

## 2020-09-23 NOTE — Progress Notes (Addendum)
STROKE TEAM PROGRESS NOTE   INTERVAL HISTORY Afib with RVR overnight. Amiodarone drip started. Remains on low dose norepinephrine drip.   No one is at the bedside.    Neuro exam is unchanged. Continued to open eyes to voice. Shakes head no when asked if he is in pain. No verbal response. Squeezes fingers on the LUE.   Palliative care [place], [place], spoke with daughter and updated patient' status to full comfort care.   Vitals:   09/23/20 0600 09/23/20 0700 09/23/20 0715 09/23/20 0730  BP: (!) 130/110 98/60 97/68 (!) 84/69  Pulse: 66 98 (!) 41 (!) 58  Resp: 13 14 (!) 27 (!) 22  Temp:      TempSrc:      SpO2: 100% 100% 99% 98%  Weight:      Height:       CBC:  Recent Labs  Lab 09/21/2020 0840 2020/09/21 0845 09/20/20 0505 09/21/20 0440  WBC 8.5   < > 6.5 7.9  NEUTROABS 4.8  --   --   --   HGB 14.7   < > 12.9* 12.8*  HCT 46.0   < > 38.5* 37.9*  MCV 93.3   < > 89.3 86.1  PLT 154   < > 102* 102*   < > = values in this interval not displayed.   Basic Metabolic Panel:  Recent Labs  Lab 09/18/20 0500 09/18/20 1321 09/18/20 1618 09/19/20 0529 09/22/20 0908 09/23/20 0500 09/23/20 0608  NA 134*   < > 140   < > 139 138  --   K 4.7   < > 4.4   < > 4.0 3.7  --   CL 113*  --  114*   < > 97* 96*  --   CO2 10*  --  12*   < > 26 26  --   GLUCOSE 257*  --  267*   < > 133* 94  --   BUN 76*  --  91*   < > 138* 137*  --   CREATININE 3.66*  --  4.08*   < > 4.61* 4.16*  --   CALCIUM 7.7*  --  7.7*   < > 8.5* 8.1*  --   MG 2.0  --  1.9  --   --   --  2.0  PHOS 5.7*  --  5.1*  --   --   --   --    < > = values in this interval not displayed.    Lipid Panel:  Recent Labs  Lab 09/17/20 0541 09/20/20 0505  CHOL 46  --   TRIG 63  62 73  HDL 24*  --   CHOLHDL 1.9  --   VLDL 13  --   LDLCALC 9  --     HgbA1c:  Recent Labs  Lab 09/17/20 0541  HGBA1C 6.7*   Urine Drug Screen: No results for input(s): LABOPIA, COCAINSCRNUR, LABBENZ, AMPHETMU, THCU, LABBARB in the last 168 hours.   Alcohol Level No results for input(s): ETH in the last 168 hours.  IMAGING  CT head:  Result date: 09/17/2020 IMPRESSION: Interval development of a small to moderate subarachnoid hemorrhage within the left sylvian fissure. Acute left frontal infarct involving the frontal operculum is seen with no significant associated mass effect or midline shift.  Stable senescent changes.  Remote right thalamic infarct.  DG Chest Port 1 View Result Date: 09/18/2020 IMPRESSION: 1. Minimal left base/retrocardiac density unchanged and may be due to  atelectasis or infection. 2.  Tubes and lines unchanged.   DG CHEST PORT 1 VIEW Result Date: 09/17/2020 IMPRESSION: Interval placement of right internal jugular catheter with distal tip in the expected position of the right atrium. No pneumothorax is noted. Otherwise stable support apparatus.  CT head Result date: 2020/10/06 IMPRESSION: 1. Area of grey-white loss and hypoattenuation in the left parieto-occipital region is age-indeterminant without priors, but favored remote given suggestion of cortical volume loss.  2. No acute hemorrhage.  CT angio head/neck Result date: October 06, 2020 IMPRESSION: 1. Occlusion versus high-grade stenosis of a proximal left M2 MCA branch (series 11, image 84; series 12, image 89; series 10, image 150). 2. 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 3. Mild to moderate stenosis of bilateral intradural vertebral arteries. 4. Ground-glass opacities in the dependent lungs bilaterally  October 06, 2020 Echo 1. Left ventricular ejection fraction, by estimation, is <20%. The left  ventricle has severely decreased function. The left ventricle demonstrates  global hypokinesis. The left ventricular internal cavity size was severely  dilated. Left ventricular  diastolic function could not be evaluated.  2. Right ventricular systolic function is severely reduced. The right  ventricular size is mildly enlarged. There is  severely elevated pulmonary  artery systolic pressure. The estimated right ventricular systolic  pressure is 62.9 mmHg.  3. Left atrial size was severely dilated.  4. Right atrial size was severely dilated.  5. The mitral valve is normal in structure. Mild mitral valve  regurgitation. No evidence of mitral stenosis.  6. Tricuspid valve regurgitation is moderate.  7. The aortic valve is tricuspid. There is mild calcification of the  aortic valve. Aortic valve regurgitation is not visualized. Mild aortic  valve sclerosis is present, with no evidence of aortic valve stenosis.  8. The inferior vena cava is dilated in size with <50% respiratory  variability, suggesting right atrial pressure of 15 mmHg  PHYSICAL EXAM General: Elderly male, lying in the bed on oxygen by nasal cannula,  restlessly moving in bed Pulm: Resp mild labored with audible congestion, bilateral fine crackles, no rhonchi or wheezes,  Card: tachy irreg w/ afib, no murmur abd: soft, nontender, nondistended, bowel sounds present Ext: Weak pulses  Neurological Exam : Opens eyes to voice. Blinks to visual threat on the left only. Left gaze preference. Not tracking on the right. Left upper extremity spontaneous movement 4/5. Weak withdraw to left lower extremity. Right side hemiplegia.  ASSESSMENT/PLAN [name] is a 75 y.o. male with history of  Afib on Xarelto(last dose in AM on 09/15/20), CKD 3, COPD, dilated cardiomyopathy, HLD, HTN, ICD, DM2, Vtach who woke up fine at 0500. Last seen by family at 0630 on 10/06/2020. Found down at 0700. Moaning. Immediately called 911 and he was brought in by EMS as a stroke code for R hemiplegia, aphasia, R facial droop.  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 Severe cardiomyopathy vs. COVID related hypercoagulable state   Code stroke CT head: Area of grey-white loss and hypoattenuation  in the left parieto-occipital region   CTA head/neck: Occlusion versus high-grade stenosis of a proximal left M2 MCA branch  MRI  : unable to obtain d/t ICD  S/p IR with TICI 2b reperfusion  CT head (09/17/2020): SAH within the left sylvian fissure. Acute left frontal infarct involving the frontal operculum  2D Echo   10-06-20: EF <20%, left ventricle  has severely decreased function, global hypokinesis. Left atrial size was severely dilated. No intracardiac source of embolism detected   LDL 9  HgbA1c 6.7  VTE prophylaxis - heparin subq  Diet: NPO  Xarelto (rivaroxaban) daily prior to admission, now on No antithrombotic due to [facility].    Therapy recommendations:  n/a  Disposition: pt critically ill, with multiorgan failure, palliative care on board, daughter has requested one-way extubation and now has chosen full comfort care only measures    Goals of Care   Palliative care following, has talked with daughter, and now transition to comfort care today.   Left sylvian fissure SAH  Post op SAH  CT repeat showed stable SAH  Transition to comfort care   Chronic afib w/ RVR   Cardiology on board, amiodarone d/c due to liver failure  Holding off [facility] in the setting of [facility]  Given liver function improving, amiodarone restarted 4/6  Now transition to comfort care  Heart failure  Cardiogenic shock  Echo: EF <20%, left ventricle has severely decreased function, left ventricle demonstrates global hypokinesis  Cardiology on board   IV Lasix infusion transitioned to BID push in the setting of cardiogenic shock, liver failure, and worsening renal failure  CCM management appreciated  Transition to comfort care   Respiratory Failure Septic shock  Due to acute stroke, acute pulmonary edema, and gram-positive cocci of pneumonia  Full vent support  Sputum culture (3/31): gram-positive cocci   Ceftriaxone 2g q 24hrs  WBC 13.7->12.4->9.2->6.5->7.9  CCM managing and  appreciated  Extubated 09/21/20  Transition to comfort only measures  Acute on chronic renal failure  ATN from cardiorenal syndrome  Creatinine level 1.7->2.31->3.07->3.66->4.58->5.09->5.06->4.61->4.16  Increased urine output   Lasix drip -> IVP BID  Transition to comfort only measures  Liver failure   AST 4269-> 453->249->128  ALT 2834->1532->1109->832  Amiodarone discontinued -> restarted 4/6 -> off for comfort care  Transition to comfort only measures   COVID infection   3/31 PCR + for COVID, asymptomatic and initially positive 3/20  Respiratory failure with intubation -> one way extubation 09/21/20 -> tolerating well  Now transition to comfort care  Hypertension history hypotension  Home meds:  lisinopril  Was on low-dose levophed . Drips to be discontinued with transition to comfort care.   Hyperlipidemia  Home meds:  atorvastatin,  resumed in hospital  LDL 9, goal < 70  No statin now due to extremely low LDL and liver failure  Diabetes type II Controlled  Home meds:  8 units insulin at 10pm  Was on Lantus to 20 units daily -> off for comfort care  HgbA1c 6.7, goal < 7.0  Other Stroke Risk Factors   Advanced Age >/= 11    Coronary artery disease   Congestive heart failure   This plan of care was directed by Dr. Roda Shutters.  Janey Genta, NP-C  ATTENDING NOTE: I reviewed above note and agree with the assessment and plan. Pt was seen and examined.   RN at bedside.  Patient lying in bed, no significant neuro changes, still open eyes on voice, able to follow commands with eye closure but no any other commands.  Spontaneous moving left upper extremity purposefully, bilateral lower extremity withdraw to pain, left more than right.  Right upper extremity flaccid.  Able to moan to painful stimuli, nonverbal.  Kidney function continue to improve slowly however still has shortness of breath and respiratory insufficiency.  Continue to have A. fib RVR,  amiodarone IV restarted overnight.  Palliative care on board, discussed with daughter again, she requested comfort care measures given poor prognosis and quality of life with limited function.  Will start comfort care measures per family request.  For detailed assessment and plan, please refer to above as I have made changes wherever appropriate.   Marvel Plan, MD PhD Stroke Neurology 09/23/2020 5:11 PM  This patient is critically ill due to left MCA stroke, A. fib RVR, respiratory failure, shock liver, ATN and at significant risk of neurological worsening, death form multiorgan failure. This patient's care requires constant monitoring of vital signs, hemodynamics, respiratory and cardiac monitoring, review of multiple databases, neurological assessment, discussion with family, other specialists and medical decision making of high complexity. I spent 30 minutes of neurocritical care time in the care of this patient.   To contact Stroke Continuity provider, please refer to [email or web address]. After hours, contact General Neurology

## 2020-09-23 NOTE — Progress Notes (Signed)
Patient in Afib and heart rate over 120 bpm. Monitor alarming for ST elevation. This RN performed an EKG. EKG shows Afib with RVR and right bundle branch block. Elink contacted and message left for CCM provider Dr Arsenio Loader. Awaiting new orders, this RN will continue to monitor.

## 2020-09-23 NOTE — Progress Notes (Signed)
Palliative PA, [name], called pt's daughter to update on status. Daughter decided to discontinue care and call with time of death if it happens before she arrives. Levophed and Amiodarone drips were stopped and we will move toward comfort care.  CDS was called and referral number [id number] was given.  Patient is not suitable for organ donation b/c he is not on the ventilator but we are to call with TOD to assess for other suitability.  Michael [name] C  11:53 AM

## 2020-09-24 DIAGNOSIS — Z515 Encounter for palliative care: Secondary | ICD-10-CM | POA: Diagnosis not present

## 2020-09-24 DIAGNOSIS — I4891 Unspecified atrial fibrillation: Secondary | ICD-10-CM | POA: Diagnosis not present

## 2020-09-24 DIAGNOSIS — I63512 Cerebral infarction due to unspecified occlusion or stenosis of left middle cerebral artery: Secondary | ICD-10-CM | POA: Diagnosis not present

## 2020-09-24 DIAGNOSIS — N179 Acute kidney failure, unspecified: Secondary | ICD-10-CM | POA: Diagnosis not present

## 2020-09-24 NOTE — Progress Notes (Signed)
Bedside handoff with RN in 6 No 08. Bible and necklace with pt.Daughter Gaylyn Lambert notified of pts new room number.

## 2020-09-24 NOTE — Progress Notes (Addendum)
NAME:  Michael [name], MRN:  [id number], DOB:  June 23, 1945, LOS: 8 ADMISSION DATE:  09/08/2020, CONSULTATION DATE:  3/31 REFERRING MD:  Stroke team Khaliqdina , CHIEF COMPLAINT:  Stroke, respiratory failure   History of Present Illness:  75 year old male w/ PMH as per below. Admitted w/ new right sided weakness and incoherent speech at 0630, LSN 0600 walking around in house. On arrival room air sats 60s.  Initial NIHS 24, had L gaze preference. CT and CTA obtained. Finding concerning for proximal left MCA Stroke. Went to Neuro IR as was not candidate for IV TPA as was on Xarelto. Pt underwent mechanical thrombectomy and partial recannulization small device related dissection w/ SAH noted 3/31.   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 3/31 admitted w/ new left MCA stroke right sided hemiparesis right sided facial droop and aphasia. Went emergently to IR, not IV TPA candidate due to xarelto. Wide complex tachycardia w/ PVCs while in CT. Cardiology consulted by EDP. Intubated for airway protection, Left Radial aline Placed by anesthesia. Underwent  mechanical thrombectomy and partial recannulization small device related dissection w/ SAH noted. Returned to ICU on vasopressin w/ goal BP 120-160. GCS 3 but sedated. Having runs of VT so amio bolus and infusion initiated.  ECHO Left ventricular ejection fraction, by estimation, is <20%. The LV severely dilated. SVR elevated PAP. RV size enlarged. RA size large.  ventricle has severely decreased function. Could not get MRI due to ICD.  Marland Kitchen 4/1 decreased UOP, got lasix. CT head showed interval small to [facility] w/in sylvian fissure and acute L frontal infarct no mass effect. Still in shock. Looking  cardiogenic. Changing to norepi. Will wake up but has dense right sided hemiparesis. Continuing lasix. New shock liver. Renal fxn worse. Asked Palliative to see. Made DNR. No HD  . 4/3 patient continued to require Levophed, remains oliguric with rising serum creatinine . 4/5 extubated . 4/7 started on amio for Afib RVR. respiratory status is marginal, palliative follow up is pending. Neuro status is pretty stable, but overall pt seems to be dwindling. Remains on pressors . 4/8  Full comfort care per daughter [name].       Off pressors and orders for transfer to 6 N  Interim History / Subjective:  Pt. Is full comfort care per family. Off all pressors, and orders for transfer to 6N.  Objective   Blood pressure (!) 74/62, pulse (!) 53, temperature 98.1 F (36.7 C), resp. rate 16, height 5\' 2"  (1.575 m), weight 86.9 kg, SpO2 90 %. CVP:  [0 mmHg-8 mmHg] 7 mmHg      Intake/Output Summary (Last 24 hours) at 09/24/2020 0812 Last data filed at 09/23/2020 1657 Gross per 24 hour  Intake 338.18 ml  Output 650 ml  Net -311.82 ml   Filed Weights   09/19/20 0500 09/20/20 0400 09/21/20 0500  Weight: 88.7 kg 91.5 kg 86.9 kg    Examination: General: Chronically and acutely ill appearing older M, supine  in bed NAD  HENT: NCAT pink mm. Anicteric sclera. Trachea midline  Pulm: Bilateral chest excursion,few scattered rhonchi, shallow unlabored respirations, comfortable Card: Off tele, RRR, No RMG abd: Soft round ndnt + bowel sounds  Ext: no acute  joint deformity, no cyanosis or clubbing.  Neuro: Asleep, appears comfortable  Labs/imaging that I havepersonally reviewed  (right click and "Reselect all SmartList Selections" daily)     Resolved Hospital Problem list   Acute metabolic acidosis Acute hypoxic respiratory failure hyperkalemia   Assessment & Plan:    Acute encephalopathy -in setting of CVA, infection, shock, AKI, acute liver injury, possible ICU delirium P Comfort care per  family  Acute L MCA CVA s/p thrombectomy and partial recannulization -likely due to being off anticoag for ICD placement but could be COVID related hypercoagulable state  Device related dissection, L sylvian fissure SAH  L frontal infarct  P Full comfort care  Acute hypoxic respiratory failure requiring intubation - improving  PNA, Pulmonary edema COVID-19 positive, asymptomatic  P No reintubation Full comfort care  Chronic afib w/ RVR and freq PVCs and runs of VT.  P Amiodarone discontinued as family want full comfort care  Cardiogenic shock Possible component of septic shock in setting of PNA above  P Pressors discontinued as pt is full comfort care  Shock liver , improving  P No further Lab work Full comfort care  Acute on chronic renal failure CKD III  ATN, Cardiorenal syndrome  P No HD  No further Lab work Full comfort care  DM II  NO further CBG's comfort care  Full Comfort Care Orders for Ativan, Dilaudid, and Robinul, and artificial tears.  Pt. Appears comfortable  Goals of Care 75 yo M with CVA, multisystem organ dysfunction as outlined above -neuro status has been fairly stable but overall prognosis is guarded. Family has set some very reasonable limitations of care (DNR/I, no HD) but further goals of care are yet to be determined-- I see in 4/6 notes where family hoped to allow more time.  -4/7: I think the patient's body is declaring itself. His respiratory status is marginal and I think that it would be appropriate to consider symptom management for respiratory symptoms. Remains very weak -- I do not think he has shown improvements that would allow him to eat enough to recover. Remains pressor dependent with multisystem organ dysfunction. -I would recommend consideration of transition to comfort care  -Palliative spoke  with family 4/7, patient transitioned to full comfort care. Orders placed for transfer to 6N  Best Practice (right click and "Reselect  all SmartList Selections" daily)   Diet:  NPO Pain/Anxiety/Delirium protocol (if indicated): N/A VAP protocol (if indicated): N/A DVT prophylaxis: SCD GI prophylaxis: PPI Glucose control:  SSI Yes Central venous access: Yes needed Arterial line: No Foley:  Yes, and it is still needed Mobility: As tolerated PT consulted: Yes Code Status:  DNR/DNI Prognosis: Life-threating POOR PROGNOSIS  Last date of multidisciplinary goals of care discussion: 4/7 See palliations note 4/7. Full comfort care     Total APP time: 15 minutes  Bevelyn Ngo, MSN, AGACNP-BC [facility] Pulmonary/Critical Care Medicine See Amion for personal pager PCCM on call pager [phone] 09/24/2020 8:25 AM

## 2020-09-24 NOTE — Progress Notes (Addendum)
STROKE TEAM PROGRESS NOTE   INTERVAL HISTORY Comfort care status   No one is at the bedside.    Michael [name] is resting peacefully with no apparent distress. Irregular apneic quiet respirations are noted.   Vitals:   09/24/20 0300 09/24/20 0400 09/24/20 0500 09/24/20 0600  BP:      Pulse: (!) 117 (!) 49 (!) 57 (!) 53  Resp: 20 19 15 16   Temp:      TempSrc:      SpO2: (!) 79% 91% (!) 81% 90%  Weight:      Height:       CBC:  Recent Labs  Lab 09/20/20 0505 09/21/20 0440  WBC 6.5 7.9  HGB 12.9* 12.8*  HCT 38.5* 37.9*  MCV 89.3 86.1  PLT 102* 102*   Basic Metabolic Panel:  Recent Labs  Lab 09/18/20 0500 09/18/20 1321 09/18/20 1618 09/19/20 0529 09/22/20 0908 09/23/20 0500 09/23/20 0608  NA 134*   < > 140   < > 139 138  --   K 4.7   < > 4.4   < > 4.0 3.7  --   CL 113*  --  114*   < > 97* 96*  --   CO2 10*  --  12*   < > 26 26  --   GLUCOSE 257*  --  267*   < > 133* 94  --   BUN 76*  --  91*   < > 138* 137*  --   CREATININE 3.66*  --  4.08*   < > 4.61* 4.16*  --   CALCIUM 7.7*  --  7.7*   < > 8.5* 8.1*  --   MG 2.0  --  1.9  --   --   --  2.0  PHOS 5.7*  --  5.1*  --   --   --   --    < > = values in this interval not displayed.    Lipid Panel:  Recent Labs  Lab 09/20/20 0505  TRIG 73    HgbA1c:  No results for input(s): HGBA1C in the last 168 hours. Urine Drug Screen: No results for input(s): LABOPIA, COCAINSCRNUR, LABBENZ, AMPHETMU, THCU, LABBARB in the last 168 hours.  Alcohol Level No results for input(s): ETH in the last 168 hours.  IMAGING  CT head:  Result date: 09/17/2020 IMPRESSION: Interval development of a small to moderate subarachnoid hemorrhage within the left sylvian fissure. Acute left frontal infarct involving the frontal operculum is seen with no significant associated mass effect or midline shift.  Stable senescent changes.  Remote right thalamic infarct.  DG Chest Port 1 View Result Date: 09/18/2020 IMPRESSION: 1. Minimal left  base/retrocardiac density unchanged and may be due to atelectasis or infection. 2.  Tubes and lines unchanged.   DG CHEST PORT 1 VIEW Result Date: 09/17/2020 IMPRESSION: Interval placement of right internal jugular catheter with distal tip in the expected position of the right atrium. No pneumothorax is noted. Otherwise stable support apparatus.  CT head Result date: 08/18/2020 IMPRESSION: 1. Area of grey-white loss and hypoattenuation in the left parieto-occipital region is age-indeterminant without priors, but favored remote given suggestion of cortical volume loss.  2. No acute hemorrhage.  CT angio head/neck Result date: 08/19/2020 IMPRESSION: 1. Occlusion versus high-grade stenosis of a proximal left M2 MCA branch (series 11, image 84; series 12, image 89; series 10, image 150). 2. 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 3. Mild to moderate stenosis  of bilateral intradural vertebral arteries. 4. Ground-glass opacities in the dependent lungs bilaterally  Oct 09, 2020 Echo 1. Left ventricular ejection fraction, by estimation, is <20%. The left  ventricle has severely decreased function. The left ventricle demonstrates  global hypokinesis. The left ventricular internal cavity size was severely  dilated. Left ventricular  diastolic function could not be evaluated.  2. Right ventricular systolic function is severely reduced. The right  ventricular size is mildly enlarged. There is severely elevated pulmonary  artery systolic pressure. The estimated right ventricular systolic  pressure is 62.9 mmHg.  3. Left atrial size was severely dilated.  4. Right atrial size was severely dilated.  5. The mitral valve is normal in structure. Mild mitral valve  regurgitation. No evidence of mitral stenosis.  6. Tricuspid valve regurgitation is moderate.  7. The aortic valve is tricuspid. There is mild calcification of the  aortic valve. Aortic valve regurgitation is not  visualized. Mild aortic  valve sclerosis is present, with no evidence of aortic valve stenosis.  8. The inferior vena cava is dilated in size with <50% respiratory  variability, suggesting right atrial pressure of 15 mmHg  PHYSICAL EXAM General: Elderly male, lying in bed with eyes closed no apparent distress.  Resp: Quiet, apneic and irregular  bilateral fine crackles, no rhonchi or wheezes,   Neurological Exam : Resting eyes closed, not responsive to voice or gentle tactile stimulation. Appears comfortable.  Detailed neuro exam deferred in comfort care status.   ASSESSMENT/PLAN Michael [name] is a 75 y.o. male with history of  Afib on Xarelto(last dose in AM on 09/15/20), CKD 3, COPD, dilated cardiomyopathy, HLD, HTN, ICD, DM2, Vtach who woke up fine at 0500. Last seen by family at 0630 on 2020/10/09. Found down at 0700. Moaning. Immediately called 911 and he was brought in by EMS as a stroke code for R hemiplegia, aphasia, R facial droop.  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 Severe cardiomyopathy vs. COVID related hypercoagulable state   Code stroke CT head: Area of grey-white loss and hypoattenuation in the left parieto-occipital region   CTA head/neck: Occlusion versus high-grade stenosis of a proximal left M2 MCA branch  MRI  : unable to obtain d/t ICD  S/p IR with TICI 2b reperfusion  CT head (09/17/2020): SAH within the left sylvian fissure. Acute left frontal infarct involving the frontal operculum  2D Echo   October 09, 2020: EF <20%, left ventricle has severely decreased function, global hypokinesis. Left atrial size was severely dilated. No intracardiac source of embolism detected   LDL 9  HgbA1c 6.7  VTE prophylaxis - heparin subq  Diet: NPO  Xarelto (rivaroxaban) daily prior to admission, now on No antithrombotic due to [facility].    Therapy recommendations:  n/a  Disposition: pt  critically ill, with multiorgan failure, palliative care on board, daughter has requested one-way extubation and now has chosen full comfort care only measures    Goals of Care   Palliative care following, has talked with daughter, and now transition to comfort care today.   Left sylvian fissure SAH  Post op SAH  CT repeat showed stable SAH  Transition to comfort care   Chronic afib w/ RVR   Cardiology on board, amiodarone d/c due to liver failure  Holding off [facility] in the setting of [facility] [place]  Given liver function improving, amiodarone restarted 4/6  Now transition to  comfort care  Heart failure  Cardiogenic shock  Echo: EF <20%, left ventricle has severely decreased function, left ventricle demonstrates global hypokinesis  Cardiology on board   IV Lasix infusion transitioned to BID push in the setting of cardiogenic shock, liver failure, and worsening renal failure  CCM management appreciated  Transition to comfort care   Respiratory Failure Septic shock  Due to acute stroke, acute pulmonary edema, and gram-positive cocci of pneumonia  Full vent support  Sputum culture (3/31): gram-positive cocci   Ceftriaxone 2g q 24hrs  WBC 13.7->12.4->9.2->6.5->7.9  CCM managing and appreciated  Extubated 09/21/20  Transition to comfort only measures  Acute on chronic renal failure  ATN from cardiorenal syndrome  Creatinine level 1.7->2.31->3.07->3.66->4.58->5.09->5.06->4.61->4.16  Increased urine output   Lasix drip -> IVP BID  Transition to comfort only measures  Liver failure   AST 4269-> 453->249->128  ALT 2834->1532->1109->832  Amiodarone discontinued -> restarted 4/6 -> off for comfort care  Transition to comfort only measures   COVID infection   3/31 PCR + for COVID, asymptomatic and initially positive 3/20  Respiratory failure with intubation -> one way extubation 09/21/20 -> tolerating well  Now transition to comfort care  Hypertension  history hypotension  Home meds:  lisinopril  Was on low-dose levophed . Comfort care status . Vitals to be checked only at family request  Hyperlipidemia  Home meds:  atorvastatin,  resumed in hospital  LDL 9, goal < 70  No statin now due to extremely low LDL and liver failure  Comfort care status  Diabetes type II Controlled  Home meds:  8 units insulin at 10pm  Was on Lantus to 20 units daily -> off for comfort care  HgbA1c 6.7, goal < 7.0  Comfort care status  Other Stroke Risk Factors   Advanced Age >/= 1    Coronary artery disease   Congestive heart failure   This plan of care was directed by Dr. Roda Shutters.  Janey Genta, NP-C  ATTENDING NOTE: I reviewed above note and agree with the assessment and plan. Pt was seen and examined.   No family at bedside. Pt lying in bed, eyes close, no spontaneous movement, seems comfortable, not in distress. Continue comfort care measures. SW working on placement.  Marvel Plan, MD PhD Stroke Neurology 09/24/2020 7:40 PM     To contact Stroke Continuity provider, please refer to [email or web address]. After hours, contact General Neurology

## 2020-09-24 NOTE — Progress Notes (Signed)
Recieved patient from ICU, unresponsive, with periods of apnea noted on his respiration. Lines intact, no family member at bedside.

## 2020-09-24 NOTE — Progress Notes (Signed)
Daily Progress Note   Patient Name: Michael [name]       Date: 09/24/2020 DOB: December 28, 1945  Age: 75 y.o. MRN#: [id number] Attending Physician: Marvel Plan, MD Primary Care Physician: Hurshel Party, NP Admit Date: 2020/09/20  Reason for Consultation/Follow-up: Establishing goals of care  Subjective: Medical records reviewed. I assessed patient at the bedside. Patient appears comfortable in no distress. Breathing comfortably on room air.   Called his daughter Michael [name] to provide an update. She is grateful to hear that the patient looks comfortable today. Multiple family members were able to visit yesterday evening including children, grandchildren, and great-grandchildren. They were able to shave the patient, pray, and read from the bible. She shares her concern that his breathing was irregular with long pauses. Provided education on the trajectory and expectations at EOL. Reassured daughter that she is doing everything she can to demonstrate her love.  Michael [name] notes that patient was quite sleepy during their visit and wonders if this is due to his pain medications. I agreed that he appears more lethargic during my exam and he did not open his eyes like he did yesterday. She thoughtfully states that she accepts this as long as he is comfortable. She is praying that he will "wake up and eat" after enough rest. She wonders what his recent blood sugars and vitals are. Provided an update and clarification that labs were no longer being draw, as this causes pain and the patient's comfort would not be affected by results.  Michael [name] verbalizes understanding and voices appreciation of the comfort focused care that the patient is receiving.    Questions and concerns addressed. PMT will continue to support  holistically.  Length of Stay: 8  Current Medications: Scheduled Meds:  .  stroke: mapping our early stages of recovery book   Does not apply Once  . Chlorhexidine Gluconate Cloth  6 each Topical Q0600  .  HYDROmorphone (DILAUDID) injection  1 mg Intravenous Q3H  . mouth rinse  15 mL Mouth Rinse 10 times per day    Continuous Infusions: . sodium chloride Stopped (09/20/20 1459)    PRN Meds: acetaminophen **OR** acetaminophen (TYLENOL) oral liquid 160 mg/5 mL **OR** acetaminophen, antiseptic oral rinse, bisacodyl, glycopyrrolate, HYDROmorphone (DILAUDID) injection, LORazepam, ondansetron (ZOFRAN) IV, polyvinyl alcohol  Physical Exam Vitals and nursing note  reviewed.  Constitutional:      General: He is not in acute distress.    Appearance: He is ill-appearing.  Cardiovascular:     Rate and Rhythm: Bradycardia present.  Pulmonary:     Effort: Pulmonary effort is normal.  Neurological:     Mental Status: He is lethargic.             Vital Signs: BP (!) 74/62   Pulse (!) 53   Temp 98.1 F (36.7 C)   Resp 16   Ht 5\' 2"  (1.575 m)   Wt 86.9 kg   SpO2 90%   BMI 35.04 kg/m  SpO2: SpO2: 90 % O2 Device: O2 Device: Room Air O2 Flow Rate: O2 Flow Rate (L/min): 2 L/min  Intake/output summary:   Intake/Output Summary (Last 24 hours) at 09/24/2020 1113 Last data filed at 09/23/2020 1657 Gross per 24 hour  Intake 14.61 ml  Output 450 ml  Net -435.39 ml   LBM: Last BM Date: 09/17/20 Baseline Weight: Weight: 75.9 kg Most recent weight: Weight: 86.9 kg       Palliative Assessment/Data: 10%     Patient Active Problem List   Diagnosis Date Noted  . Atrial fibrillation with rapid ventricular response (HCC)   . End of life care   . Pulmonary edema   . Cardiogenic shock (HCC)   . Acute ischemic left MCA stroke (HCC) 09/05/2020  . Acute respiratory failure (HCC)   . Acute renal failure superimposed on stage 3b chronic kidney disease (HCC)   . Acute on chronic systolic  (congestive) heart failure (HCC) 09/05/2020  . Coronary artery disease involving native coronary artery of native heart without angina pectoris 09/12/2019  . Former smoker 04/03/2019  . Mixed hyperlipidemia 04/03/2019  . Current use of long term anticoagulation 08/23/2017  . Chronic kidney disease, stage 3 unspecified (HCC) 12/03/2014  . Presence of automatic implantable cardioverter-defibrillator 12/03/2014  . Chronic atrial fibrillation (HCC) 12/02/2014  . Chronic systolic heart failure (HCC) 12/02/2014  . Dilated cardiomyopathy (HCC) 12/02/2014  . Ventricular tachycardia (HCC) 12/02/2014    Palliative Care Assessment & Plan   Patient Profile: 75 y.o.malewith past medical history of atrial fibrillation, ventricular tachycardia, DM2, CHF EF 20-25%, CKD 3,who was admitted on 3/31/2022with right hemiplegia and facial droop. He was diagnosed with a large left MCA CVA. He underwent thrombectomy with stent. On 4/1 imaging revealed a new sub arachnoid hemorrhage and another stroke. His course has been complicated with afib w/RVR and cardiogenic shock. At the time of this consultation Mr. Branscome is on the vent but has increased work of breathing. He is tachycardic 130s - 140s. His transaminases are in the thousands, and his creatinine has risen to 3.66. He is on pressors to maintain his blood pressure.  Assessment: Acute left MCA stroke SAH Chronic afib w/ RVR Cardiogenic shock Respiratory failure Heart failure Acute on chronic renal failure Liver failure  Recommendations/Plan: Comfort focused care  Psychosocial and emotional support provided for patient's daughter  Ongoing support from PMT  Goals of Care and Additional Recommendations:  Limitations on Scope of Treatment: Full Comfort Care  Code Status: DNR/DNI  Code Status Orders  (From admission, onward)         Start     Ordered   09/17/20 1054  Do not attempt resuscitation (DNR)  Continuous        09/17/20  1053        Code Status History    Date Active Date Inactive Code Status  Order ID Comments User Context   2020/10/01 1037 09/17/2020 1053 Full Code [id number]  Erick Blinks, MD ED   09/05/2020 1612 09/10/2020 1842 Full Code [id number]  Jonelle Sidle, MD Inpatient   Advance Care Planning Activity     Prognosis:   Hours - Days poor long-term prognosis given multiorgan failure, afib with RVR, declining despite interventions.  Discharge Planning:  Anticipated Hospital Death   Care plan was discussed with RN Junious Dresser, daughter Michael [name], Dr. Roda Shutters, Dr. Merrily Pew, Tessie Fass NP, Shon Hale NP.   Thank you for allowing the Palliative Medicine Team to assist in the care of this patient.   Time In: 10:45am Time Out: 11:10am Total Time 25 minutes Prolonged Time Billed  no   Greater than 50% of this time was spent counseling and coordinating care related to the above assessment and plan.  Hatim Homann Jeni Salles, PA-C  Please contact Palliative Medicine Team phone at [phone] for questions and concerns.

## 2020-09-24 NOTE — Progress Notes (Signed)
[employer] called to turn ICD to "off".

## 2020-09-24 NOTE — Progress Notes (Signed)
Per Sherril Croon with [employer], this pt's ICD was turned off yesterday.

## 2020-09-25 DIAGNOSIS — Z515 Encounter for palliative care: Secondary | ICD-10-CM | POA: Diagnosis not present

## 2020-09-25 DIAGNOSIS — I63512 Cerebral infarction due to unspecified occlusion or stenosis of left middle cerebral artery: Secondary | ICD-10-CM | POA: Diagnosis not present

## 2020-09-25 MED ORDER — LORAZEPAM 2 MG/ML IJ SOLN
0.5000 mg | Freq: Three times a day (TID) | INTRAMUSCULAR | Status: DC
  Administered 2020-09-25: 0.5 mg via INTRAVENOUS
  Filled 2020-09-25: qty 1

## 2020-09-25 MED ORDER — SODIUM CHLORIDE 0.9% FLUSH: 10.0000 mL | INTRAVENOUS | Status: DC | PRN

## 2020-09-25 MED ORDER — HYDROMORPHONE HCL 1 MG/ML IJ SOLN
2.0000 mg | INTRAMUSCULAR | Status: DC
  Administered 2020-09-25 – 2020-09-28 (×25): 2 mg via INTRAVENOUS
  Filled 2020-09-25 (×25): qty 2

## 2020-09-25 MED ORDER — LORAZEPAM 2 MG/ML IJ SOLN
1.0000 mg | Freq: Three times a day (TID) | INTRAMUSCULAR | Status: DC
  Administered 2020-09-25 – 2020-09-28 (×10): 1 mg via INTRAVENOUS
  Filled 2020-09-25 (×10): qty 1

## 2020-09-25 MED ORDER — ACETAMINOPHEN 10 MG/ML IV SOLN
1000.0000 mg | Freq: Four times a day (QID) | INTRAVENOUS | Status: AC
  Administered 2020-09-25 – 2020-09-26 (×4): 1000 mg via INTRAVENOUS
  Filled 2020-09-25 (×4): qty 100

## 2020-09-25 NOTE — Progress Notes (Signed)
Daily Progress Note   Patient Name: Michael [name]       Date: 09/25/2020 DOB: 08/28/1945  Age: 75 y.o. MRN#: [id number] Attending Physician: Micki Riley, MD Primary Care Physician: Hurshel Party, NP Admit Date: October 13, 2020  Reason for Consultation/Follow-up: symptom management comfort care     Subjective:  Patient is non-responsive and actively dying.  His goal is comfort.   Assessment: Patient diaphoretic with increased work of breathing.   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 He was diagnosed with a large left MCA CVA. He underwent thrombectomy with stent. On 4/1 imaging revealed a new sub arachnoid hemorrhage and another stroke. His course has been complicated with afib w/RVR and cardiogenic shock. At the time of this consultation [name] is on the vent but has increased work of breathing. He is tachycardic 130s - 140s. His transaminases are in the thousands, and his creatinine has risen to 3.66. He is on pressors to maintain his blood pressure.   Length of Stay: 9   Vital Signs: BP 102/67 (BP Location: Left Arm)   Pulse 97   Temp (!) 97.5 F (36.4 C) (Oral)   Resp 18   Ht 5\' 2"  (1.575 m)   Wt 86.9 kg   SpO2 100%   BMI 35.04 kg/m  SpO2: SpO2: 100 % O2 Device: O2 Device: Room Air O2 Flow Rate: O2 Flow Rate (L/min): 2 L/min       Palliative Assessment/Data:  10%     Palliative Care Plan    Recommendations/Plan:  Increased scheduled dilaudid and ativan to increase level of comfort with breathing.  Scheduled IV tylenol as the patient appears feverish and diaphoretic.  Unrestricted  visitation status.  PMT will continue to follow  Code Status:  DNR  Prognosis:   Hours - Days   Discharge Planning:  Anticipated Hospital Death   Thank you for allowing the Palliative Medicine Team to assist in the care of this patient.  Total time spent:  15 min.     Greater than 50%  of this time was spent counseling and coordinating care related to the above assessment and plan.  , PA-C Palliative Medicine  Please contact Palliative MedicineTeam  phone at [phone] for questions and concerns between 7 am - 7 pm.   Please see AMION for individual provider pager numbers.

## 2020-09-25 NOTE — Progress Notes (Signed)
STROKE TEAM PROGRESS NOTE   INTERVAL HISTORY Patient made comfort care and transfer to 6 N.  No family at the bedside.  Appears to be resting comfortably. . Irregular apneic quiet respirations are noted.   Vitals:   09/24/20 0400 09/24/20 0500 09/24/20 0600 09/25/20 0549  BP:    102/67  Pulse: (!) 49 (!) 57 (!) 53 97  Resp: 19 15 16 18   Temp:    (!) 97.5 F (36.4 C)  TempSrc:    Oral  SpO2: 91% (!) 81% 90% 100%  Weight:      Height:       CBC:  Recent Labs  Lab 09/20/20 0505 09/21/20 0440  WBC 6.5 7.9  HGB 12.9* 12.8*  HCT 38.5* 37.9*  MCV 89.3 86.1  PLT 102* 102*   Basic Metabolic Panel:  Recent Labs  Lab 09/18/20 1618 09/19/20 0529 09/22/20 0908 09/23/20 0500 09/23/20 0608  NA 140   < > 139 138  --   K 4.4   < > 4.0 3.7  --   CL 114*   < > 97* 96*  --   CO2 12*   < > 26 26  --   GLUCOSE 267*   < > 133* 94  --   BUN 91*   < > 138* 137*  --   CREATININE 4.08*   < > 4.61* 4.16*  --   CALCIUM 7.7*   < > 8.5* 8.1*  --   MG 1.9  --   --   --  2.0  PHOS 5.1*  --   --   --   --    < > = values in this interval not displayed.    Lipid Panel:  Recent Labs  Lab 09/20/20 0505  TRIG 73    HgbA1c:  No results for input(s): HGBA1C in the last 168 hours. Urine Drug Screen: No results for input(s): LABOPIA, COCAINSCRNUR, LABBENZ, AMPHETMU, THCU, LABBARB in the last 168 hours.  Alcohol Level No results for input(s): ETH in the last 168 hours.  IMAGING  CT head:  Result date: 09/17/2020 IMPRESSION: Interval development of a small to moderate subarachnoid hemorrhage within the left sylvian fissure. Acute left frontal infarct involving the frontal operculum is seen with no significant associated mass effect or midline shift.  Stable senescent changes.  Remote right thalamic infarct.  DG Chest Port 1 View Result Date: 09/18/2020 IMPRESSION: 1. Minimal left base/retrocardiac density unchanged and may be due to atelectasis or infection. 2.  Tubes and lines unchanged.    DG CHEST PORT 1 VIEW Result Date: 09/17/2020 IMPRESSION: Interval placement of right internal jugular catheter with distal tip in the expected position of the right atrium. No pneumothorax is noted. Otherwise stable support apparatus.  CT head Result date: 08/20/2020 IMPRESSION: 1. Area of grey-white loss and hypoattenuation in the left parieto-occipital region is age-indeterminant without priors, but favored remote given suggestion of cortical volume loss.  2. No acute hemorrhage.  CT angio head/neck Result date: 09/13/2020 IMPRESSION: 1. Occlusion versus high-grade stenosis of a proximal left M2 MCA branch (series 11, image 84; series 12, image 89; series 10, image 150). 2. 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 3. Mild to moderate stenosis of bilateral intradural vertebral arteries. 4. Ground-glass opacities in the dependent lungs bilaterally  09/15/2020 Echo 1. Left ventricular ejection fraction, by estimation, is <20%. The left  ventricle has severely decreased function. The left ventricle demonstrates  global hypokinesis. The left ventricular internal cavity  size was severely  dilated. Left ventricular  diastolic function could not be evaluated.  2. Right ventricular systolic function is severely reduced. The right  ventricular size is mildly enlarged. There is severely elevated pulmonary  artery systolic pressure. The estimated right ventricular systolic  pressure is 62.9 mmHg.  3. Left atrial size was severely dilated.  4. Right atrial size was severely dilated.  5. The mitral valve is normal in structure. Mild mitral valve  regurgitation. No evidence of mitral stenosis.  6. Tricuspid valve regurgitation is moderate.  7. The aortic valve is tricuspid. There is mild calcification of the  aortic valve. Aortic valve regurgitation is not visualized. Mild aortic  valve sclerosis is present, with no evidence of aortic valve stenosis.  8. The  inferior vena cava is dilated in size with <50% respiratory  variability, suggesting right atrial pressure of 15 mmHg  PHYSICAL EXAM General: Elderly male, lying in bed with eyes closed no apparent distress.  Resp: Quiet, apneic and irregular  bilateral fine crackles, no rhonchi or wheezes,   Neurological Exam : Resting eyes closed, not responsive to voice or gentle tactile stimulation. Appears comfortable.  Detailed neuro exam deferred in comfort care status.   ASSESSMENT/PLAN Michael [name] is a 75 y.o. male with history of  Afib on Xarelto(last dose in AM on 09/15/20), CKD 3, COPD, dilated cardiomyopathy, HLD, HTN, ICD, DM2, Vtach who woke up fine at 0500. Last seen by family at 0630 on 09/04/2020. Found down at 0700. Moaning. Immediately called 911 and he was brought in by EMS as a stroke code for R hemiplegia, aphasia, R facial droop.  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 Severe cardiomyopathy vs. COVID related hypercoagulable state   Code stroke CT head: Area of grey-white loss and hypoattenuation in the left parieto-occipital region   CTA head/neck: Occlusion versus high-grade stenosis of a proximal left M2 MCA branch  MRI  : unable to obtain d/t ICD  S/p IR with TICI 2b reperfusion  CT head (09/17/2020): SAH within the left sylvian fissure. Acute left frontal infarct involving the frontal operculum  2D Echo   08/22/2020: EF <20%, left ventricle has severely decreased function, global hypokinesis. Left atrial size was severely dilated. No intracardiac source of embolism detected   LDL 9  HgbA1c 6.7  VTE prophylaxis - heparin subq  Diet: NPO  Xarelto (rivaroxaban) daily prior to admission, now on No antithrombotic due to [facility].    Therapy recommendations:  n/a  Disposition: pt critically ill, with multiorgan failure, palliative care on board, daughter has requested one-way extubation  and now has chosen full comfort care only measures    Goals of Care   Palliative care following, has talked with daughter, and now transition to comfort care today.   Left sylvian fissure SAH  Post op SAH  CT repeat showed stable SAH  Transition to comfort care   Chronic afib w/ RVR   Cardiology on board, amiodarone d/c due to liver failure  Holding off AC in the setting of [facility]  Given liver function improving, amiodarone restarted 4/6  Now transition to comfort care  Heart failure  Cardiogenic shock  Echo: EF <20%, left ventricle has severely decreased function, left ventricle demonstrates global hypokinesis  Cardiology on board   IV Lasix infusion transitioned to BID push in the setting of cardiogenic shock, liver failure, and  worsening renal failure  CCM management appreciated  Transition to comfort care   Respiratory Failure Septic shock  Due to acute stroke, acute pulmonary edema, and gram-positive cocci of pneumonia  Full vent support  Sputum culture (3/31): gram-positive cocci   Ceftriaxone 2g q 24hrs  WBC 13.7->12.4->9.2->6.5->7.9  CCM managing and appreciated  Extubated 09/21/20  Transition to comfort only measures  Acute on chronic renal failure  ATN from cardiorenal syndrome  Creatinine level 1.7->2.31->3.07->3.66->4.58->5.09->5.06->4.61->4.16  Increased urine output   Lasix drip -> IVP BID  Transition to comfort only measures  Liver failure   AST 4269-> 453->249->128  ALT 2834->1532->1109->832  Amiodarone discontinued -> restarted 4/6 -> off for comfort care  Transition to comfort only measures   COVID infection   3/31 PCR + for COVID, asymptomatic and initially positive 3/20  Respiratory failure with intubation -> one way extubation 09/21/20 -> tolerating well  Now transition to comfort care  Hypertension history hypotension  Home meds:  lisinopril  Was on low-dose levophed . Comfort care status . Vitals to be  checked only at family request  Hyperlipidemia  Home meds:  atorvastatin,  resumed in hospital  LDL 9, goal < 70  No statin now due to extremely low LDL and liver failure  Comfort care status  Diabetes type II Controlled  Home meds:  8 units insulin at 10pm  Was on Lantus to 20 units daily -> off for comfort care  HgbA1c 6.7, goal < 7.0  Comfort care status  Other Stroke Risk Factors   Advanced Age >/= 20    Coronary artery disease   Congestive heart failure   Patient is full comfort care measures only.  Appreciate help from palliative care team.  Continue current treatment.  No family available at the bedside.  X anticipate hospital death in the next few days.  Greater than 50% time during this 25-minute visit was spent in reviewing chart and discussion with care team and RN and palliative care team and answering questions.   Delia Heady, MD Stroke Neurology 09/25/2020 4:04 PM     To contact Stroke Continuity provider, please refer to [email or web address]. After hours, contact General Neurology

## 2020-09-26 DIAGNOSIS — R570 Cardiogenic shock: Secondary | ICD-10-CM | POA: Diagnosis not present

## 2020-09-26 DIAGNOSIS — I63512 Cerebral infarction due to unspecified occlusion or stenosis of left middle cerebral artery: Secondary | ICD-10-CM | POA: Diagnosis not present

## 2020-09-26 DIAGNOSIS — Z515 Encounter for palliative care: Secondary | ICD-10-CM | POA: Diagnosis not present

## 2020-09-26 NOTE — Progress Notes (Signed)
STROKE TEAM PROGRESS NOTE   INTERVAL HISTORY Patient appears to be resting comfortably. . Irregular apneic quiet respirations are noted.   Vitals:   09/24/20 0500 09/24/20 0600 09/25/20 0549 09/26/20 0447  BP:   102/67 (!) 86/55  Pulse: (!) 57 (!) 53 97 (!) 50  Resp: 15 16 18 19   Temp:   (!) 97.5 F (36.4 C) 98.9 F (37.2 C)  TempSrc:   Oral Axillary  SpO2: (!) 81% 90% 100% 94%  Weight:      Height:       CBC:  Recent Labs  Lab 09/20/20 0505 09/21/20 0440  WBC 6.5 7.9  HGB 12.9* 12.8*  HCT 38.5* 37.9*  MCV 89.3 86.1  PLT 102* 102*   Basic Metabolic Panel:  Recent Labs  Lab 09/22/20 0908 09/23/20 0500 09/23/20 0608  NA 139 138  --   K 4.0 3.7  --   CL 97* 96*  --   CO2 26 26  --   GLUCOSE 133* 94  --   BUN 138* 137*  --   CREATININE 4.61* 4.16*  --   CALCIUM 8.5* 8.1*  --   MG  --   --  2.0    Lipid Panel:  Recent Labs  Lab 09/20/20 0505  TRIG 73    HgbA1c:  No results for input(s): HGBA1C in the last 168 hours. Urine Drug Screen: No results for input(s): LABOPIA, COCAINSCRNUR, LABBENZ, AMPHETMU, THCU, LABBARB in the last 168 hours.  Alcohol Level No results for input(s): ETH in the last 168 hours.  IMAGING  CT head:  Result date: 09/17/2020 IMPRESSION: Interval development of a small to moderate subarachnoid hemorrhage within the left sylvian fissure. Acute left frontal infarct involving the frontal operculum is seen with no significant associated mass effect or midline shift.  Stable senescent changes.  Remote right thalamic infarct.  DG Chest Port 1 View Result Date: 09/18/2020 IMPRESSION: 1. Minimal left base/retrocardiac density unchanged and may be due to atelectasis or infection. 2.  Tubes and lines unchanged.   DG CHEST PORT 1 VIEW Result Date: 09/17/2020 IMPRESSION: Interval placement of right internal jugular catheter with distal tip in the expected position of the right atrium. No pneumothorax is noted. Otherwise stable support  apparatus.  CT head Result date: 2020-10-15 IMPRESSION: 1. Area of grey-white loss and hypoattenuation in the left parieto-occipital region is age-indeterminant without priors, but favored remote given suggestion of cortical volume loss.  2. No acute hemorrhage.  CT angio head/neck Result date: 10/15/20 IMPRESSION: 1. Occlusion versus high-grade stenosis of a proximal left M2 MCA branch (series 11, image 84; series 12, image 89; series 10, image 150). 2. 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 3. Mild to moderate stenosis of bilateral intradural vertebral arteries. 4. Ground-glass opacities in the dependent lungs bilaterally  10-15-2020 Echo 1. Left ventricular ejection fraction, by estimation, is <20%. The left  ventricle has severely decreased function. The left ventricle demonstrates  global hypokinesis. The left ventricular internal cavity size was severely  dilated. Left ventricular  diastolic function could not be evaluated.  2. Right ventricular systolic function is severely reduced. The right  ventricular size is mildly enlarged. There is severely elevated pulmonary  artery systolic pressure. The estimated right ventricular systolic  pressure is 62.9 mmHg.  3. Left atrial size was severely dilated.  4. Right atrial size was severely dilated.  5. The mitral valve is normal in structure. Mild mitral valve  regurgitation. No evidence of mitral stenosis.  6. Tricuspid valve regurgitation is moderate.  7. The aortic valve is tricuspid. There is mild calcification of the  aortic valve. Aortic valve regurgitation is not visualized. Mild aortic  valve sclerosis is present, with no evidence of aortic valve stenosis.  8. The inferior vena cava is dilated in size with <50% respiratory  variability, suggesting right atrial pressure of 15 mmHg  PHYSICAL EXAM General: Elderly male, lying in bed with eyes closed no apparent distress.  Resp: Quiet, apneic  and irregular  bilateral fine crackles, no rhonchi or wheezes,   Neurological Exam : Resting eyes closed, not responsive to voice or gentle tactile stimulation. Appears comfortable.  Detailed neuro exam deferred in comfort care status.   ASSESSMENT/PLAN Mr. Lakeem Rozo is a 75 y.o. male with history of  Afib on Xarelto(last dose in AM on 09/15/20), CKD 3, COPD, dilated cardiomyopathy, HLD, HTN, ICD, DM2, Vtach who woke up fine at 0500. Last seen by family at 0630 on September 20, 2020. Found down at 0700. Moaning. Immediately called 911 and he was brought in by EMS as a stroke code for R hemiplegia, aphasia, R facial droop.  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 Severe cardiomyopathy vs. COVID related hypercoagulable state   Code stroke CT head: Area of grey-white loss and hypoattenuation in the left parieto-occipital region   CTA head/neck: Occlusion versus high-grade stenosis of a proximal left M2 MCA branch  MRI  : unable to obtain d/t ICD  S/p IR with TICI 2b reperfusion  CT head (09/17/2020): SAH within the left sylvian fissure. Acute left frontal infarct involving the frontal operculum  2D Echo   09-20-20: EF <20%, left ventricle has severely decreased function, global hypokinesis. Left atrial size was severely dilated. No intracardiac source of embolism detected   LDL 9  HgbA1c 6.7  VTE prophylaxis - heparin subq  Diet: NPO  Xarelto (rivaroxaban) daily prior to admission, now on No antithrombotic due to [facility].    Therapy recommendations:  n/a  Disposition: pt critically ill, with multiorgan failure, palliative care on board, daughter has requested one-way extubation and now has chosen full comfort care only measures    Goals of Care   Palliative care following, has talked with daughter, and now transition to comfort care today.   Left sylvian fissure SAH  Post op SAH  CT repeat showed  stable SAH  Transition to comfort care   Chronic afib w/ RVR   Cardiology on board, amiodarone d/c due to liver failure  Holding off [facility] in the setting of [facility]  Given liver function improving, amiodarone restarted 4/6  Now transition to comfort care  Heart failure  Cardiogenic shock  Echo: EF <20%, left ventricle has severely decreased function, left ventricle demonstrates global hypokinesis  Cardiology on board   IV Lasix infusion transitioned to BID push in the setting of cardiogenic shock, liver failure, and worsening renal failure  CCM management appreciated  Transition to comfort care   Respiratory Failure Septic shock  Due to acute stroke, acute pulmonary edema, and gram-positive cocci of pneumonia  Full vent support  Sputum culture (3/31): gram-positive cocci   Ceftriaxone 2g q 24hrs  WBC 13.7->12.4->9.2->6.5->7.9  CCM managing and appreciated  Extubated 09/21/20  Transition to comfort only measures  Acute on chronic renal failure  ATN from cardiorenal syndrome  Creatinine level 1.7->2.31->3.07->3.66->4.58->5.09->5.06->4.61->4.16  Increased urine output   Lasix  drip -> IVP BID  Transition to comfort only measures  Liver failure   AST 4269-> 453->249->128  ALT 2834->1532->1109->832  Amiodarone discontinued -> restarted 4/6 -> off for comfort care  Transition to comfort only measures   COVID infection   3/31 PCR + for COVID, asymptomatic and initially positive 3/20  Respiratory failure with intubation -> one way extubation 09/21/20 -> tolerating well  Now transition to comfort care  Hypertension history hypotension  Home meds:  lisinopril  Was on low-dose levophed . Comfort care status . Vitals to be checked only at family request  Hyperlipidemia  Home meds:  atorvastatin,  resumed in hospital  LDL 9, goal < 70  No statin now due to extremely low LDL and liver failure  Comfort care status  Diabetes type II Controlled  Home  meds:  8 units insulin at 10pm  Was on Lantus to 20 units daily -> off for comfort care  HgbA1c 6.7, goal < 7.0  Comfort care status  Other Stroke Risk Factors   Advanced Age >/= 52    Coronary artery disease   Congestive heart failure   Patient is full comfort care measures only.  Appreciate help from palliative care team.  Continue current treatment.  No family available at the bedside.  Anticipate hospital death in the next few days.    Delia Heady, MD Stroke Neurology 09/26/2020 1:25 PM     To contact Stroke Continuity provider, please refer to [email or web address]. After hours, contact General Neurology

## 2020-09-26 NOTE — Progress Notes (Signed)
Daily Progress Note   Patient Name: Michael [name]       Date: 09/26/2020 DOB: Sep 14, 1945  Age: 75 y.o. MRN#: [id number] Attending Physician: Micki Riley, MD Primary Care Physician: Hurshel Party, NP Admit Date: 08/19/2020  Reason for Consultation/Follow-up: Terminal Care  Subjective: Actively dying. Appears comfortable.   Review of Systems  Unable to perform ROS: Acuity of condition    Length of Stay: 10  Current Medications: Scheduled Meds:  .  stroke: mapping our early stages of recovery book   Does not apply Once  . Chlorhexidine Gluconate Cloth  6 each Topical Q0600  .  HYDROmorphone (DILAUDID) injection  2 mg Intravenous Q3H  . LORazepam  1 mg Intravenous Q8H  . mouth rinse  15 mL Mouth Rinse 10 times per day    Continuous Infusions: . sodium chloride Stopped (09/20/20 1459)    PRN Meds: acetaminophen **OR** acetaminophen (TYLENOL) oral liquid 160 mg/5 mL **OR** acetaminophen, antiseptic oral rinse, bisacodyl, glycopyrrolate, HYDROmorphone (DILAUDID) injection, LORazepam, ondansetron (ZOFRAN) IV, polyvinyl alcohol, sodium chloride flush  Physical Exam Vitals and nursing note reviewed.  Constitutional:      Appearance: He is ill-appearing.  Cardiovascular:     Comments: anasarca Pulmonary:     Effort: Pulmonary effort is normal.  Musculoskeletal:     Comments: Loss of jaw muscle control  Neurological:     Comments: unresponsive             Vital Signs: BP (!) 86/55 (BP Location: Left Arm)   Pulse (!) 50   Temp 98.9 F (37.2 C) (Axillary)   Resp 19   Ht 5\' 2"  (1.575 m)   Wt 86.9 kg   SpO2 94%   BMI 35.04 kg/m  SpO2: SpO2: 94 % O2 Device: O2 Device: Room Air O2 Flow Rate: O2 Flow Rate (L/min): 2 L/min  Intake/output summary:   Intake/Output Summary  (Last 24 hours) at 09/26/2020 1126 Last data filed at 09/26/2020 11/26/2020 Gross per 24 hour  Intake 200 ml  Output 925 ml  Net -725 ml   LBM: Last BM Date: 09/24/20 Baseline Weight: Weight: 75.9 kg Most recent weight: Weight: 86.9 kg       Palliative Assessment/Data: PPS: 10%      Patient Active Problem List   Diagnosis Date Noted  . Atrial fibrillation  with rapid ventricular response (HCC)   . End of life care   . Pulmonary edema   . Cardiogenic shock (HCC)   . Acute ischemic left MCA stroke (HCC) 08/31/2020  . Acute respiratory failure (HCC)   . Acute renal failure superimposed on stage 3b chronic kidney disease (HCC)   . Acute on chronic systolic (congestive) heart failure (HCC) 09/05/2020  . Coronary artery disease involving native coronary artery of native heart without angina pectoris 09/12/2019  . Former smoker 04/03/2019  . Mixed hyperlipidemia 04/03/2019  . Current use of long term anticoagulation 08/23/2017  . Chronic kidney disease, stage 3 unspecified (HCC) 12/03/2014  . Presence of automatic implantable cardioverter-defibrillator 12/03/2014  . Chronic atrial fibrillation (HCC) 12/02/2014  . Chronic systolic heart failure (HCC) 12/02/2014  . Dilated cardiomyopathy (HCC) 12/02/2014  . Ventricular tachycardia (HCC) 12/02/2014    Palliative Care Assessment & Plan   Patient Profile:  75 y.o.malewith past medical history of atrial fibrillation, ventricular tachycardia, DM2, CHF EF 20-25%, CKD 3,who was admitted on 3/31/2022with right hemiplegia and facial droop. He was diagnosed with a large left MCA CVA. He underwent thrombectomy with stent. On 4/1 imaging revealed a new sub arachnoid hemorrhage and another stroke. His course has been complicated with afib w/RVR and cardiogenic shock. At the time of this consultation Mr. Garrette is on the vent but has increased work of breathing. He is tachycardic 130s - 140s. His transaminases are in the thousands, and his  creatinine has risen to 3.66. He is on pressors to maintain his blood pressure.  Assessment/Recommendations/Plan   Actively dying, unresponsive  Not stable for transfer out of hospital  PMT will continue to follow for symptom management needs  Goals of Care and Additional Recommendations:  Limitations on Scope of Treatment: Full Comfort Care  Code Status:  DNR  Prognosis:   Hours - Days  Discharge Planning:  Anticipated Hospital Death  Thank you for allowing the Palliative Medicine Team to assist in the care of this patient.   Total time: 18 mins   Greater than 50%  of this time was spent counseling and coordinating care related to the above assessment and plan.  Ocie Bob, AGNP-C Palliative Medicine   Please contact Palliative Medicine Team phone at [phone] for questions and concerns.

## 2020-09-27 DIAGNOSIS — I63512 Cerebral infarction due to unspecified occlusion or stenosis of left middle cerebral artery: Secondary | ICD-10-CM | POA: Diagnosis not present

## 2020-09-27 NOTE — Progress Notes (Signed)
This chaplain was present at the Pt. bedside.  The chaplain checked in with the Pt. RN-Erin. The Pt. appears to be resting comfortably with intervals of silent and audible breathing. This chaplain observed a moment of silence with the Pt.  F/U spiritual care is available as needed.

## 2020-09-27 NOTE — Progress Notes (Signed)
STROKE TEAM PROGRESS NOTE   INTERVAL HISTORY Patient appears to be apneic for long periods and remains unresponsive irregular apneic quiet respirations are noted.   Vitals:   09/24/20 0600 09/25/20 0549 09/26/20 0447 09/27/20 0327  BP:  102/67 (!) 86/55 (!) 57/34  Pulse: (!) 53 97 (!) 50 80  Resp: 16 18 19 19   Temp:  (!) 97.5 F (36.4 C) 98.9 F (37.2 C) (!) 97.4 F (36.3 C)  TempSrc:  Oral Axillary Axillary  SpO2: 90% 100% 94% (!) 80%  Weight:      Height:       CBC:  Recent Labs  Lab 09/21/20 0440  WBC 7.9  HGB 12.8*  HCT 37.9*  MCV 86.1  PLT 102*   Basic Metabolic Panel:  Recent Labs  Lab 09/22/20 0908 09/23/20 0500 09/23/20 0608  NA 139 138  --   K 4.0 3.7  --   CL 97* 96*  --   CO2 26 26  --   GLUCOSE 133* 94  --   BUN 138* 137*  --   CREATININE 4.61* 4.16*  --   CALCIUM 8.5* 8.1*  --   MG  --   --  2.0    Lipid Panel:  No results for input(s): CHOL, TRIG, HDL, CHOLHDL, VLDL, LDLCALC in the last 168 hours.  HgbA1c:  No results for input(s): HGBA1C in the last 168 hours. Urine Drug Screen: No results for input(s): LABOPIA, COCAINSCRNUR, LABBENZ, AMPHETMU, THCU, LABBARB in the last 168 hours.  Alcohol Level No results for input(s): ETH in the last 168 hours.  IMAGING  CT head:  Result date: 09/17/2020 IMPRESSION: Interval development of a small to moderate subarachnoid hemorrhage within the left sylvian fissure. Acute left frontal infarct involving the frontal operculum is seen with no significant associated mass effect or midline shift.  Stable senescent changes.  Remote right thalamic infarct.  DG Chest Port 1 View Result Date: 09/18/2020 IMPRESSION: 1. Minimal left base/retrocardiac density unchanged and may be due to atelectasis or infection. 2.  Tubes and lines unchanged.   DG CHEST PORT 1 VIEW Result Date: 09/17/2020 IMPRESSION: Interval placement of right internal jugular catheter with distal tip in the expected position of the right atrium. No  pneumothorax is noted. Otherwise stable support apparatus.  CT head Result date: 08/19/2020 IMPRESSION: 1. Area of grey-white loss and hypoattenuation in the left parieto-occipital region is age-indeterminant without priors, but favored remote given suggestion of cortical volume loss.  2. No acute hemorrhage.  CT angio head/neck Result date: 08/24/2020 IMPRESSION: 1. Occlusion versus high-grade stenosis of a proximal left M2 MCA branch (series 11, image 84; series 12, image 89; series 10, image 150). 2. 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 3. Mild to moderate stenosis of bilateral intradural vertebral arteries. 4. Ground-glass opacities in the dependent lungs bilaterally  09/08/2020 Echo 1. Left ventricular ejection fraction, by estimation, is <20%. The left  ventricle has severely decreased function. The left ventricle demonstrates  global hypokinesis. The left ventricular internal cavity size was severely  dilated. Left ventricular  diastolic function could not be evaluated.  2. Right ventricular systolic function is severely reduced. The right  ventricular size is mildly enlarged. There is severely elevated pulmonary  artery systolic pressure. The estimated right ventricular systolic  pressure is 62.9 mmHg.  3. Left atrial size was severely dilated.  4. Right atrial size was severely dilated.  5. The mitral valve is normal in structure. Mild mitral valve  regurgitation.  No evidence of mitral stenosis.  6. Tricuspid valve regurgitation is moderate.  7. The aortic valve is tricuspid. There is mild calcification of the  aortic valve. Aortic valve regurgitation is not visualized. Mild aortic  valve sclerosis is present, with no evidence of aortic valve stenosis.  8. The inferior vena cava is dilated in size with <50% respiratory  variability, suggesting right atrial pressure of 15 mmHg  PHYSICAL EXAM General: Elderly male, lying in bed with eyes  closed no apparent distress.  Resp: Quiet, apneic and irregular  bilateral fine crackles, no rhonchi or wheezes,   Neurological Exam : Resting eyes closed, not responsive to voice or gentle tactile stimulation. Appears comfortable.  Detailed neuro exam deferred in comfort care status.   ASSESSMENT/PLAN Michael [name] is a 75 y.o. male with history of  Afib on Xarelto(last dose in AM on 09/15/20), CKD 3, COPD, dilated cardiomyopathy, HLD, HTN, ICD, DM2, Vtach who woke up fine at 0500. Last seen by family at 0630 on Oct 06, 2020. Found down at 0700. Moaning. Immediately called 911 and he was brought in by EMS as a stroke code for R hemiplegia, aphasia, R facial droop.  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 Severe cardiomyopathy vs. COVID related hypercoagulable state   Code stroke CT head: Area of grey-white loss and hypoattenuation in the left parieto-occipital region   CTA head/neck: Occlusion versus high-grade stenosis of a proximal left M2 MCA branch  MRI  : unable to obtain d/t ICD  S/p IR with TICI 2b reperfusion  CT head (09/17/2020): SAH within the left sylvian fissure. Acute left frontal infarct involving the frontal operculum  2D Echo   2020-10-06: EF <20%, left ventricle has severely decreased function, global hypokinesis. Left atrial size was severely dilated. No intracardiac source of embolism detected   LDL 9  HgbA1c 6.7  VTE prophylaxis - heparin subq  Diet: NPO  Xarelto (rivaroxaban) daily prior to admission, now on No antithrombotic due to [facility].    Therapy recommendations:  n/a  Disposition: pt critically ill, with multiorgan failure, palliative care on board, daughter has requested one-way extubation and now has chosen full comfort care only measures    Goals of Care   Palliative care following, has talked with daughter, and now transition to comfort care today.   Left  sylvian fissure SAH  Post op SAH  CT repeat showed stable SAH  Transition to comfort care   Chronic afib w/ RVR   Cardiology on board, amiodarone d/c due to liver failure  Holding off [facility] in the setting of [facility]  Given liver function improving, amiodarone restarted 4/6  Now transition to comfort care  Heart failure  Cardiogenic shock  Echo: EF <20%, left ventricle has severely decreased function, left ventricle demonstrates global hypokinesis  Cardiology on board   IV Lasix infusion transitioned to BID push in the setting of cardiogenic shock, liver failure, and worsening renal failure  CCM management appreciated  Transition to comfort care   Respiratory Failure Septic shock  Due to acute stroke, acute pulmonary edema, and gram-positive cocci of pneumonia  Full vent support  Sputum culture (3/31): gram-positive cocci   Ceftriaxone 2g q 24hrs  WBC 13.7->12.4->9.2->6.5->7.9  CCM managing and appreciated  Extubated 09/21/20  Transition to comfort only measures  Acute on chronic renal failure  ATN from cardiorenal syndrome  Creatinine level 1.7->2.31->3.07->3.66->4.58->5.09->5.06->4.61->4.16  Increased urine output   Lasix drip -> IVP BID  Transition to comfort only measures  Liver failure   AST 4269-> 453->249->128  ALT 2834->1532->1109->832  Amiodarone discontinued -> restarted 4/6 -> off for comfort care  Transition to comfort only measures   COVID infection   3/31 PCR + for COVID, asymptomatic and initially positive 3/20  Respiratory failure with intubation -> one way extubation 09/21/20 -> tolerating well  Now transition to comfort care  Hypertension history hypotension  Home meds:  lisinopril  Was on low-dose levophed . Comfort care status . Vitals to be checked only at family request  Hyperlipidemia  Home meds:  atorvastatin,  resumed in hospital  LDL 9, goal < 70  No statin now due to extremely low LDL and liver  failure  Comfort care status  Diabetes type II Controlled  Home meds:  8 units insulin at 10pm  Was on Lantus to 20 units daily -> off for comfort care  HgbA1c 6.7, goal < 7.0  Comfort care status  Other Stroke Risk Factors   Advanced Age >/= 92    Coronary artery disease   Congestive heart failure   Patient is full comfort care measures only.  His condition appears preterminal now.  Continue current treatment.  No family available at the bedside.  Anticipate hospital death soon.    Delia Heady, MD Stroke Neurology 09/27/2020 2:37 PM     To contact Stroke Continuity provider, please refer to [email or web address]. After hours, contact General Neurology

## 2020-09-27 NOTE — Progress Notes (Signed)
Nutrition Brief Note  Chart reviewed. Pt now comfort care.  No further nutrition interventions planned at this time.  Please re-consult as needed.    Braeley Buskey MS, RDN, LDN, CNSC Registered Dietitian III Clinical Nutrition RD Pager and On-Call Pager Number Located in Amion    

## 2020-09-28 DIAGNOSIS — Z515 Encounter for palliative care: Secondary | ICD-10-CM | POA: Diagnosis not present

## 2020-09-28 DIAGNOSIS — I63512 Cerebral infarction due to unspecified occlusion or stenosis of left middle cerebral artery: Secondary | ICD-10-CM | POA: Diagnosis not present

## 2020-09-29 NOTE — Progress Notes (Signed)
Body brought to the morgue.

## 2020-10-08 ENCOUNTER — Other Ambulatory Visit: Payer: Self-pay | Admitting: Student in an Organized Health Care Education/Training Program

## 2020-10-08 NOTE — Progress Notes (Signed)
I was asked to complete death certificate - since the attending of record is unavailable due to [place]. I have briefly reviewed the chart and completed the [place] DAVE certificate today.  -- Amayia Ciano, MD Neurologist [facility] Pager: [phone]  1425 hrs on 10/08/2020  

## 2020-10-17 NOTE — Plan of Care (Signed)
  Problem: Pain Managment: Goal: General experience of comfort will improve Outcome: Progressing   

## 2020-10-17 NOTE — Progress Notes (Deleted)
I was asked to complete death certificate - since the attending of record is unavailable due to being [place] the [place]. I have briefly reviewed the chart and completed the [place] DAVE certificate today.  -- Milon Dikes, MD Neurologist [facility] Pager: [phone]  1425 hrs on 10/08/2020

## 2020-10-17 NOTE — Care Management Important Message (Signed)
Important Message  Patient Details  Name: Michael [name] MRN: [id number] Date of Birth: 07-Apr-1946   Medicare Important Message Given:  Yes Patient is at the end of life out of respect IM not given I will mailed to the patient home address.    Sheretta Grumbine 09/25/2020, 2:16 PM

## 2020-10-17 NOTE — Progress Notes (Signed)
2030 Noted patient no pulse and respiration,DNR Verified by another RN. Patient expired, pronounced death by 2 RNs. Md notified of death. Mary Helen  Daughter was notified. Made referral to [facility].  

## 2020-10-17 NOTE — Progress Notes (Signed)
STROKE TEAM PROGRESS NOTE   INTERVAL HISTORY .Patient remains unresponsive and appears to be apneic for long periods and cannot be aroused.  He remains on morphine drip.  Vital signs are dwindling and death seems imminent Vitals:   09/25/20 0549 09/26/20 0447 09/27/20 0327 03-Oct-2020 0308  BP: 102/67 (!) 86/55 (!) 57/34 (!) 48/38  Pulse: 97 (!) 50 80 (!) 52  Resp: 18 19 19 19   Temp: (!) 97.5 F (36.4 C) 98.9 F (37.2 C) (!) 97.4 F (36.3 C) 97.7 F (36.5 C)  TempSrc: Oral Axillary Axillary   SpO2: 100% 94% (!) 80% (!) 84%  Weight:      Height:       CBC:  No results for input(s): WBC, NEUTROABS, HGB, HCT, MCV, PLT in the last 168 hours. Basic Metabolic Panel:  Recent Labs  Lab 09/22/20 0908 09/23/20 0500 09/23/20 0608  NA 139 138  --   K 4.0 3.7  --   CL 97* 96*  --   CO2 26 26  --   GLUCOSE 133* 94  --   BUN 138* 137*  --   CREATININE 4.61* 4.16*  --   CALCIUM 8.5* 8.1*  --   MG  --   --  2.0    Lipid Panel:  No results for input(s): CHOL, TRIG, HDL, CHOLHDL, VLDL, LDLCALC in the last 168 hours.  HgbA1c:  No results for input(s): HGBA1C in the last 168 hours. Urine Drug Screen: No results for input(s): LABOPIA, COCAINSCRNUR, LABBENZ, AMPHETMU, THCU, LABBARB in the last 168 hours.  Alcohol Level No results for input(s): ETH in the last 168 hours.  IMAGING  CT head:  Result date: 09/17/2020 IMPRESSION: Interval development of a small to moderate subarachnoid hemorrhage within the left sylvian fissure. Acute left frontal infarct involving the frontal operculum is seen with no significant associated mass effect or midline shift.  Stable senescent changes.  Remote right thalamic infarct.  DG Chest Port 1 View Result Date: 09/18/2020 IMPRESSION: 1. Minimal left base/retrocardiac density unchanged and may be due to atelectasis or infection. 2.  Tubes and lines unchanged.   DG CHEST PORT 1 VIEW Result Date: 09/17/2020 IMPRESSION: Interval placement of right internal  jugular catheter with distal tip in the expected position of the right atrium. No pneumothorax is noted. Otherwise stable support apparatus.  CT head Result date: 09/02/2020 IMPRESSION: 1. Area of grey-white loss and hypoattenuation in the left parieto-occipital region is age-indeterminant without priors, but favored remote given suggestion of cortical volume loss.  2. No acute hemorrhage.  CT angio head/neck Result date: 08/23/2020 IMPRESSION: 1. Occlusion versus high-grade stenosis of a proximal left M2 MCA branch (series 11, image 84; series 12, image 89; series 10, image 150). 2. 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 3. Mild to moderate stenosis of bilateral intradural vertebral arteries. 4. Ground-glass opacities in the dependent lungs bilaterally  09/10/2020 Echo 1. Left ventricular ejection fraction, by estimation, is <20%. The left  ventricle has severely decreased function. The left ventricle demonstrates  global hypokinesis. The left ventricular internal cavity size was severely  dilated. Left ventricular  diastolic function could not be evaluated.  2. Right ventricular systolic function is severely reduced. The right  ventricular size is mildly enlarged. There is severely elevated pulmonary  artery systolic pressure. The estimated right ventricular systolic  pressure is 62.9 mmHg.  3. Left atrial size was severely dilated.  4. Right atrial size was severely dilated.  5. The mitral valve is  normal in structure. Mild mitral valve  regurgitation. No evidence of mitral stenosis.  6. Tricuspid valve regurgitation is moderate.  7. The aortic valve is tricuspid. There is mild calcification of the  aortic valve. Aortic valve regurgitation is not visualized. Mild aortic  valve sclerosis is present, with no evidence of aortic valve stenosis.  8. The inferior vena cava is dilated in size with <50% respiratory  variability, suggesting right atrial  pressure of 15 mmHg  PHYSICAL EXAM General: Elderly male, lying in bed with eyes closed no apparent distress.  Resp: Quiet, apneic and irregular  bilateral fine crackles, no rhonchi or wheezes,   Neurological Exam : Resting eyes closed, not responsive to voice or gentle tactile stimulation. Appears comfortable.  Detailed neuro exam deferred in comfort care status.   ASSESSMENT/PLAN Michael [name] is a 75 y.o. male with history of  Afib on Xarelto(last dose in AM on 09/15/20), CKD 3, COPD, dilated cardiomyopathy, HLD, HTN, ICD, DM2, Vtach who woke up fine at 0500. Last seen by family at 0630 on 09-21-20. Found down at 0700. Moaning. Immediately called 911 and he was brought in by EMS as a stroke code for R hemiplegia, aphasia, R facial droop.  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 Severe cardiomyopathy vs. COVID related hypercoagulable state   Code stroke CT head: Area of grey-white loss and hypoattenuation in the left parieto-occipital region   CTA head/neck: Occlusion versus high-grade stenosis of a proximal left M2 MCA branch  MRI  : unable to obtain d/t ICD  S/p IR with TICI 2b reperfusion  CT head (09/17/2020): SAH within the left sylvian fissure. Acute left frontal infarct involving the frontal operculum  2D Echo   09/21/20: EF <20%, left ventricle has severely decreased function, global hypokinesis. Left atrial size was severely dilated. No intracardiac source of embolism detected   LDL 9  HgbA1c 6.7  VTE prophylaxis - heparin subq  Diet: NPO  Xarelto (rivaroxaban) daily prior to admission, now on No antithrombotic due to [facility].    Therapy recommendations:  n/a  Disposition: pt critically ill, with multiorgan failure, palliative care on board, daughter has requested one-way extubation and now has chosen full comfort care only measures    Goals of Care   Palliative care  following, has talked with daughter, and now transition to comfort care today.   Left sylvian fissure SAH  Post op SAH  CT repeat showed stable SAH  Transition to comfort care   Chronic afib w/ RVR   Cardiology on board, amiodarone d/c due to liver failure  Holding off [facility] in the setting of [facility]  Given liver function improving, amiodarone restarted 4/6  Now transition to comfort care  Heart failure  Cardiogenic shock  Echo: EF <20%, left ventricle has severely decreased function, left ventricle demonstrates global hypokinesis  Cardiology on board   IV Lasix infusion transitioned to BID push in the setting of cardiogenic shock, liver failure, and worsening renal failure  CCM management appreciated  Transition to comfort care   Respiratory Failure Septic shock  Due to acute stroke, acute pulmonary edema, and gram-positive cocci of pneumonia  Full vent support  Sputum culture (3/31): gram-positive cocci   Ceftriaxone 2g q 24hrs  WBC 13.7->12.4->9.2->6.5->7.9  CCM managing and appreciated  Extubated 09/21/20  Transition to comfort only measures  Acute on chronic renal failure  ATN  from cardiorenal syndrome  Creatinine level 1.7->2.31->3.07->3.66->4.58->5.09->5.06->4.61->4.16  Increased urine output   Lasix drip -> IVP BID  Transition to comfort only measures  Liver failure   AST 4269-> 453->249->128  ALT 2834->1532->1109->832  Amiodarone discontinued -> restarted 4/6 -> off for comfort care  Transition to comfort only measures   COVID infection   3/31 PCR + for COVID, asymptomatic and initially positive 3/20  Respiratory failure with intubation -> one way extubation 09/21/20 -> tolerating well  Now transition to comfort care  Hypertension history hypotension  Home meds:  lisinopril  Was on low-dose levophed . Comfort care status . Vitals to be checked only at family request  Hyperlipidemia  Home meds:  atorvastatin,  resumed in  hospital  LDL 9, goal < 70  No statin now due to extremely low LDL and liver failure  Comfort care status  Diabetes type II Controlled  Home meds:  8 units insulin at 10pm  Was on Lantus to 20 units daily -> off for comfort care  HgbA1c 6.7, goal < 7.0  Comfort care status  Other Stroke Risk Factors   Advanced Age >/= 40    Coronary artery disease   Congestive heart failure   Patient is full comfort care measures only.  His condition appears preterminal now.  Continue current treatment.  No family available at the bedside.  Anticipate hospital death pretty soon Delia Heady, MD Stroke Neurology 10-04-20 2:06 PM     To contact Stroke Continuity provider, please refer to [email or web address]. After hours, contact General Neurology

## 2020-10-17 NOTE — Progress Notes (Signed)
Daily Progress Note   Patient Name: Michael [name]       Date: Oct 22, 2020 DOB: Mar 16, 1946  Age: 75 y.o. MRN#: [id number] Attending Physician: Micki Riley, MD Primary Care Physician: Hurshel Party, NP Admit Date: 08/24/2020  Reason for Consultation/Follow-up: Terminal Care  Subjective: Actively dying. Appears comfortable.   ROS  Length of Stay: 12  Current Medications: Scheduled Meds:  .  stroke: mapping our early stages of recovery book   Does not apply Once  . Chlorhexidine Gluconate Cloth  6 each Topical Q0600  .  HYDROmorphone (DILAUDID) injection  2 mg Intravenous Q3H  . LORazepam  1 mg Intravenous Q8H  . mouth rinse  15 mL Mouth Rinse 10 times per day    Continuous Infusions: . sodium chloride Stopped (09/20/20 1459)    PRN Meds: acetaminophen **OR** acetaminophen (TYLENOL) oral liquid 160 mg/5 mL **OR** acetaminophen, antiseptic oral rinse, bisacodyl, glycopyrrolate, HYDROmorphone (DILAUDID) injection, LORazepam, ondansetron (ZOFRAN) IV, polyvinyl alcohol, sodium chloride flush  Physical Exam          Vital Signs: BP (!) 48/38 (BP Location: Left Arm)   Pulse (!) 52   Temp 97.7 F (36.5 C)   Resp 19   Ht 5\' 2"  (1.575 m)   Wt 86.9 kg   SpO2 (!) 84%   BMI 35.04 kg/m  SpO2: SpO2: (!) 84 % O2 Device: O2 Device: Room Air O2 Flow Rate: O2 Flow Rate (L/min): 2 L/min  Intake/output summary:   Intake/Output Summary (Last 24 hours) at 10-22-20 1726 Last data filed at 10/22/20 1303 Gross per 24 hour  Intake 0 ml  Output 75 ml  Net -75 ml   LBM: Last BM Date: 09/24/20 Baseline Weight: Weight: 75.9 kg Most recent weight: Weight: 86.9 kg       Palliative Assessment/Data:      Patient Active Problem List   Diagnosis Date Noted  . Atrial fibrillation with  rapid ventricular response (HCC)   . End of life care   . Pulmonary edema   . Cardiogenic shock (HCC)   . Acute ischemic left MCA stroke (HCC) 08/31/2020  . Acute respiratory failure (HCC)   . Acute renal failure superimposed on stage 3b chronic kidney disease (HCC)   . Acute on chronic systolic (congestive) heart failure (HCC) 09/05/2020  . Coronary artery disease  involving native coronary artery of native heart without angina pectoris 09/12/2019  . Former smoker 04/03/2019  . Mixed hyperlipidemia 04/03/2019  . Current use of long term anticoagulation 08/23/2017  . Chronic kidney disease, stage 3 unspecified (HCC) 12/03/2014  . Presence of automatic implantable cardioverter-defibrillator 12/03/2014  . Chronic atrial fibrillation (HCC) 12/02/2014  . Chronic systolic heart failure (HCC) 12/02/2014  . Dilated cardiomyopathy (HCC) 12/02/2014  . Ventricular tachycardia (HCC) 12/02/2014    Palliative Care Assessment & Plan   Patient Profile:  74 y.o.malewith past medical history of atrial fibrillation, ventricular tachycardia, DM2, CHF EF 20-25%, CKD 3,who was admitted on 22-Apr-2022with right hemiplegia and facial droop. He was diagnosed with a large left MCA CVA. He underwent thrombectomy with stent. On 4/1 imaging revealed a new sub arachnoid hemorrhage and another stroke. His course has been complicated with afib w/RVR and cardiogenic shock. At the time of this consultation [name] is on the vent but has increased work of breathing. He is tachycardic 130s - 140s. His transaminases are in the thousands, and his creatinine has risen to 3.66. He is on pressors to maintain his blood pressure. Assessment/Recommendations/Plan   Continue current comfort interventions.   Goals of Care and Additional Recommendations:  Limitations on Scope of Treatment: Full Comfort Care  Code Status:  DNR  Prognosis:   Hours - Days  Discharge Planning:  Anticipated Hospital  Death   Thank you for allowing the Palliative Medicine Team to assist in the care of this patient.   Total time:16 mins Greater than 50%  of this time was spent counseling and coordinating care related to the above assessment and plan.  Ocie Bob, AGNP-C Palliative Medicine   Please contact Palliative Medicine Team phone at [phone] for questions and concerns.

## 2020-10-17 NOTE — Discharge Summary (Signed)
Patient ID: Michael [name] MRN: [id number] DOB/AGE: 76-Dec-1947 76 y.o.  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 Severe cardiomyopathy vs. COVID related hypercoagulable state   Pertinent Medical Diagnosis: Active Problems:   Acute ischemic left MCA stroke (HCC)   Acute respiratory failure (HCC)   Acute renal failure superimposed on stage 3b chronic kidney disease (HCC)   Pulmonary edema   Cardiogenic shock (HCC)   End of life care   Atrial fibrillation with rapid ventricular response [facility])   Hospital Course:  [name] is a 75 y.o. male with history of  Afib on Xarelto(last dose in AM on 09/15/20), CKD 3, COPD, dilated cardiomyopathy, HLD, HTN, ICD, DM2, Vtach who woke up fine at 0500. Last seen by family at 0630 on 09/21/2020. Found down at 0700. Moaning. Immediately called 911 and he was brought in by EMS as a stroke code for R hemiplegia, aphasia, R facial droop.  He was taken for emergent thrombectomy for left M2 occlusion which was partially revascularized and procedure was complicated by subarachnoid hemorrhage.  Patient had history of atrial fibrillation and had been off anticoagulation he also was found to have severe cardiomyopathy with ejection fraction less than 20% and had recent Covid infection and hypercoagulability from that also contributed.  Hospital course was complicated by multiorgan failure and palliative care team was consulted and patient's family requested one-way extubation and chose full comfort care measures.  Patient was placed on morphine drip and kept comfortable until he peacefully passed away.  Signed: Delia Heady 09/29/2020, 12:40 PM

## 2020-10-17 NOTE — Clinical Note (Incomplete)
2030 Noted patient no pulse and respiration,DNR Verified by another Char[employer] fundraiser. Patient expired, pronounced death by 2 RNs. Md notified of death. [name]  Daughter was notified. Made referral to [facility].

## 2020-10-17 NOTE — Care Management Important Message (Signed)
Important Message  Patient Details  Name: Michael [name] MRN: [id number] Date of Birth: [birth year]   Medicare Important Message Given:  Yes  Patient is at end of life out of respect     Dorena Bodo 10/04/2020, 1:42 PM

## 2020-10-17 DEATH — deceased

## 2023-02-06 IMAGING — CT CT HEAD W/O CM
4 series · 16 of 47 positions shown, 18 images · non-contrast
Comparison: 09/16/2020

CLINICAL DATA: Intracranial revascularization, altered mental
status

EXAM:
CT HEAD WITHOUT CONTRAST
TECHNIQUE: Contiguous axial images were obtained from the base of the skull
through the vertex without intravenous contrast.

[Series 3: head wo · axial · 0.39mm/px · z∈[-174,-49]mm · 7 of 35 slices shown, 9 images]
[im 5/35  brain]
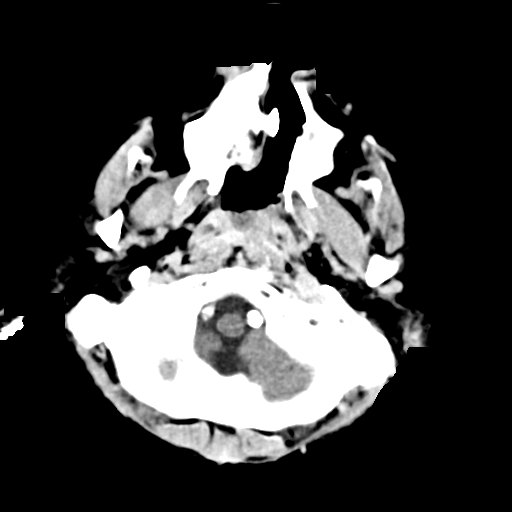
[im 5/35  bone]
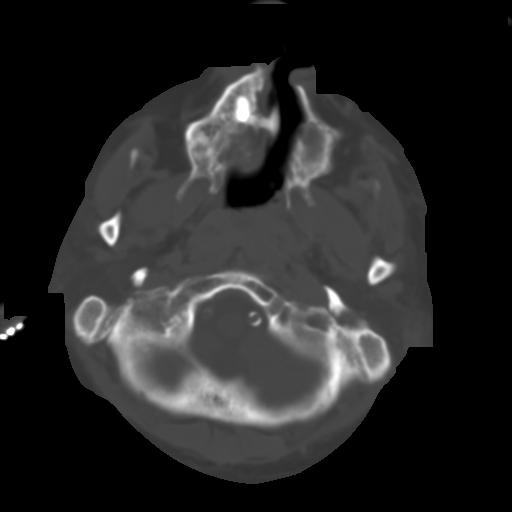
[im 9/35  brain]
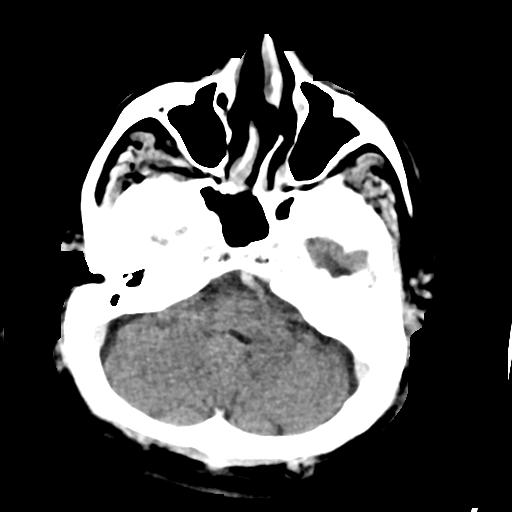
[im 13/35  brain]
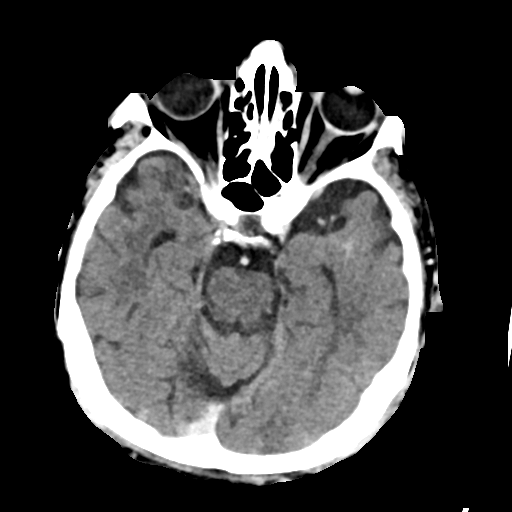
[im 18/35  brain]
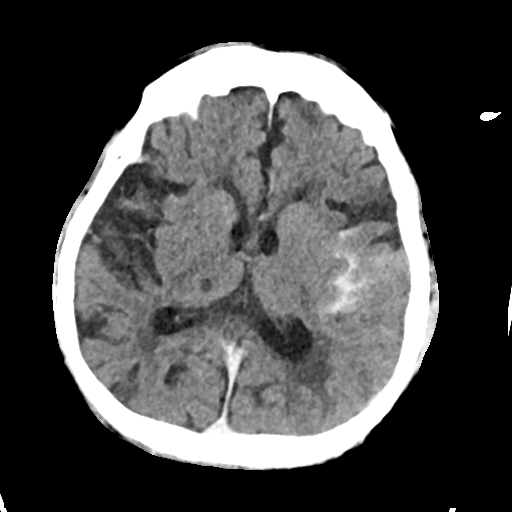
[im 22/35  brain]
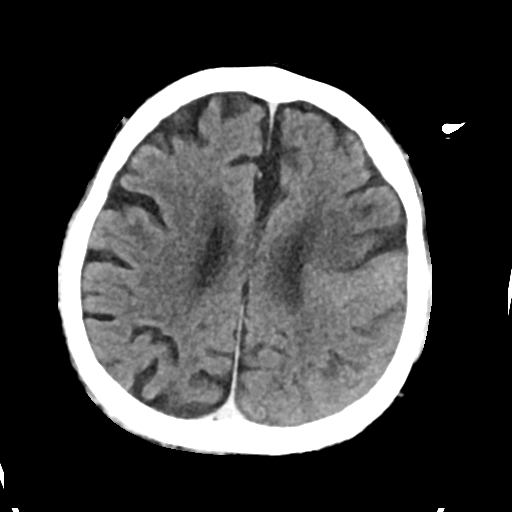
[im 22/35  bone]
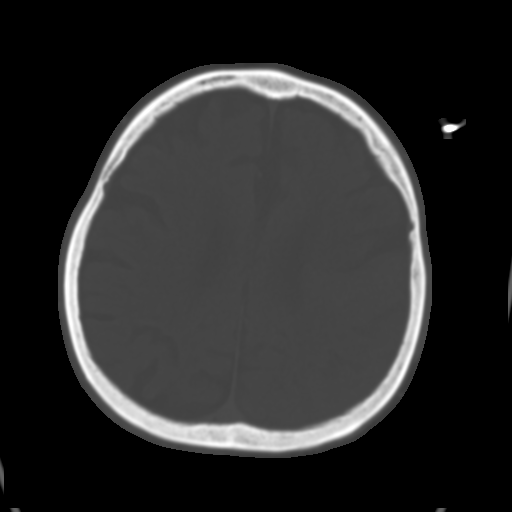
[im 26/35  brain]
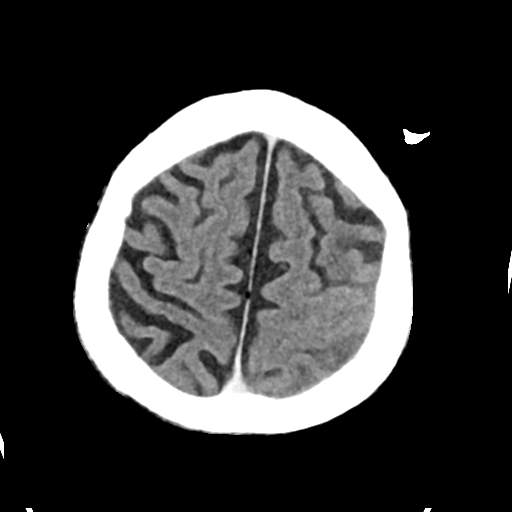
[im 30/35  brain]
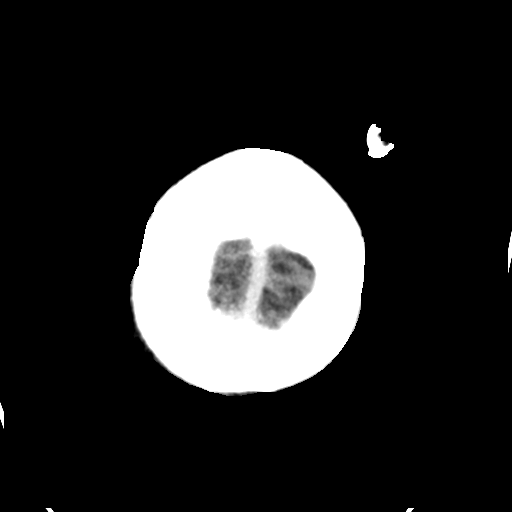

[Series 4: head bone · axial · 0.39mm/px · z∈[-178,-144]mm · 3 of 86 slices shown]
[im 9/86  bone]
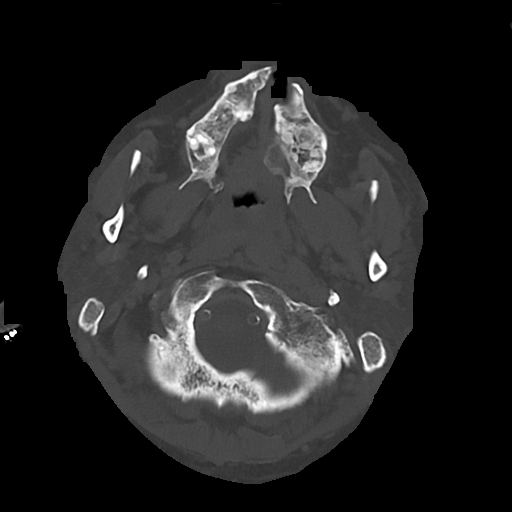
[im 18/86  bone]
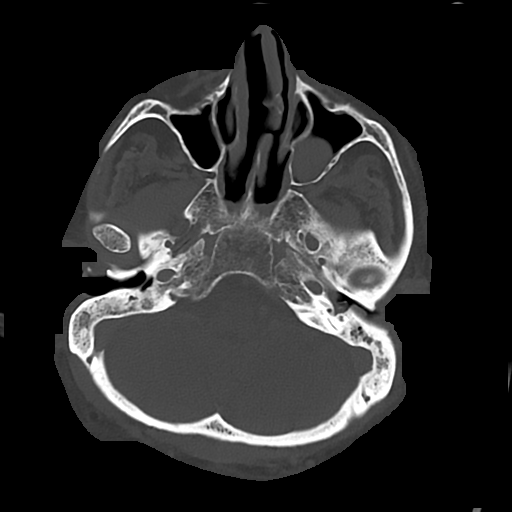
[im 26/86  bone]
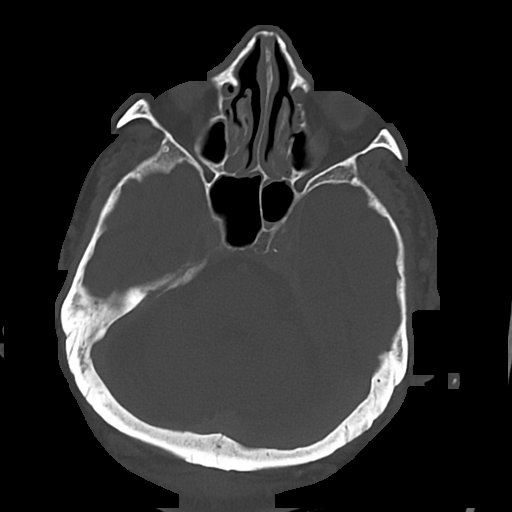

[Series 5: cor soft · coronal · 0.34mm/px · 3 of 67 slices shown]
[im 23/67  brain]
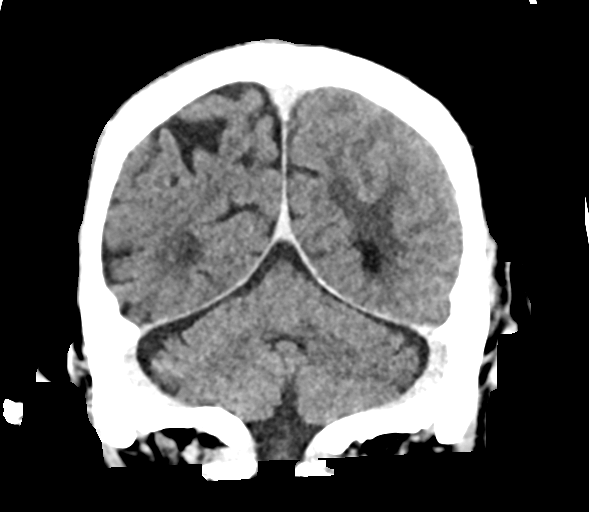
[im 30/67  brain]
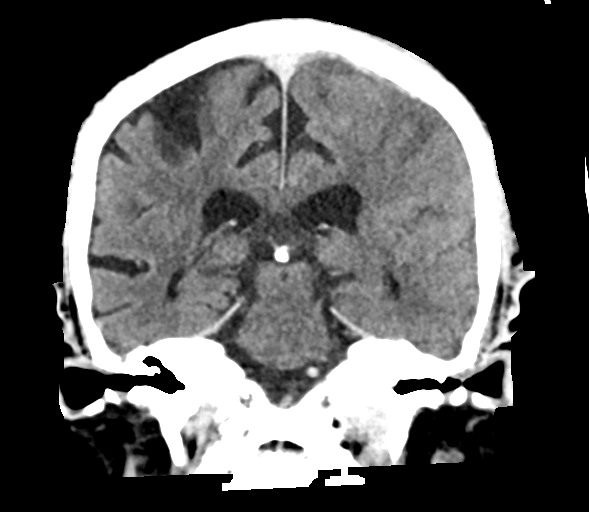
[im 37/67  brain]
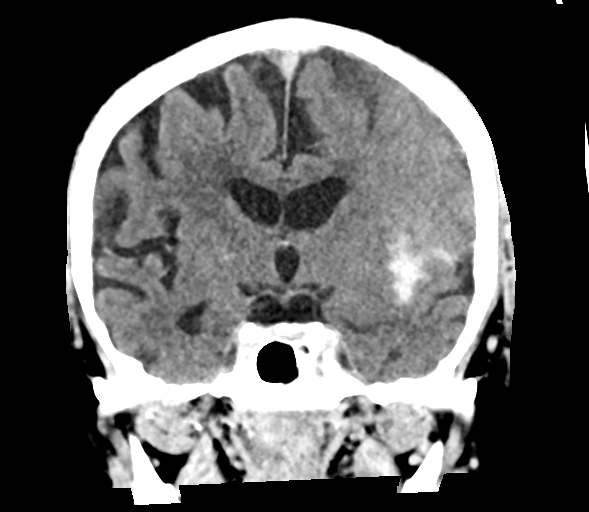

[Series 6: sag soft · sagittal · 0.34mm/px · 3 of 60 slices shown]
[im 20/60  brain]
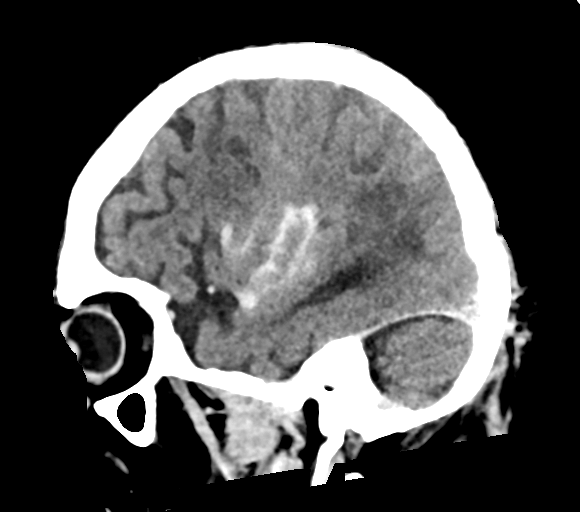
[im 30/60  brain]
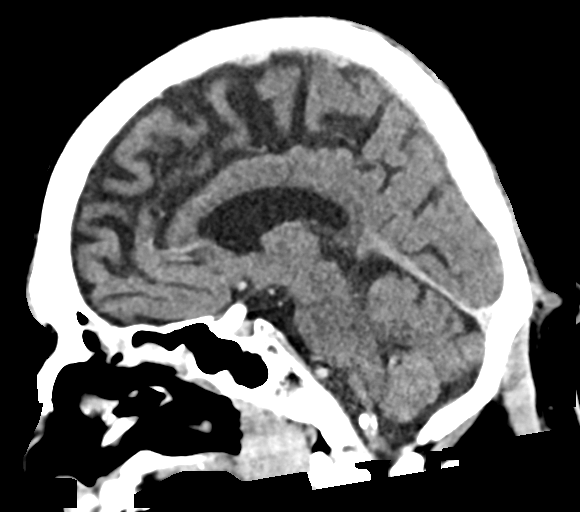
[im 40/60  brain]
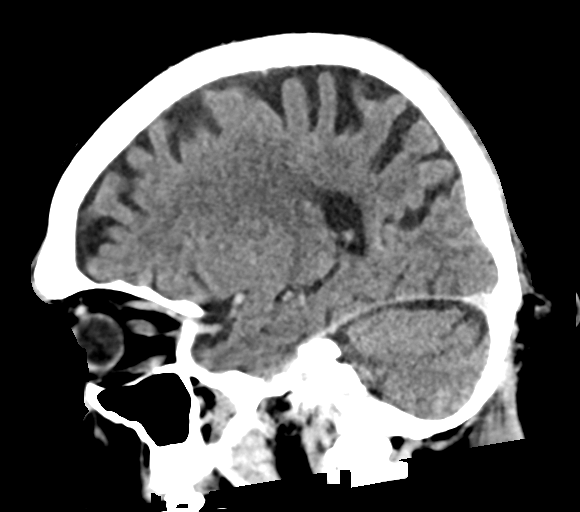

[16 of 47 positions shown; findings below may reference images not displayed]

FINDINGS: Brain: Since the prior examination, there has developed small to
moderate subarachnoid hemorrhage within the left sylvian fissure.
Additionally, there has developed cortical effacement within the
left frontal operculum in keeping with an acute cortical infarct. No
abnormal mass effect or midline shift.

Moderate parenchymal volume loss is again seen, commensurate with
the patient's age. Remote lacunar infarct noted within the right
thalamus. Moderate periventricular white matter changes are present
likely reflecting the sequela of small vessel ischemia. The
ventricular size is normal. Cerebellum is unremarkable. Advanced
atherosclerotic calcifications are noted within the carotid siphons
and distal vertebral arteries bilaterally.

Vascular: No asymmetric hyperdense vasculature at the skull base.
Residual contrast is seen within the a intravascular space.

Skull: The calvarium is intact.

Sinuses/Orbits: Left cleft palate noted. Mucous retention cyst noted
within the left maxillary sinus. Mild mucosal thickening noted
within the hypoplastic frontal and ethmoid air cells. Tiny air-fluid
level noted within the left sphenoid sinus, nonspecific. The orbits
are unremarkable.

Other: There is fluid opacification of the left mastoid air cells
and middle ear cavities. There is layering fluid noted within the
posterior nasopharynx. Right mastoid air cell and middle ear cavity
are clear.
IMPRESSION: Interval development of a small to moderate subarachnoid hemorrhage
within the left sylvian fissure. Acute left frontal infarct
involving the frontal operculum is seen with no significant
associated mass effect or midline shift.

Stable senescent changes.  Remote right thalamic infarct.

These results will be called to the ordering clinician or
representative by the Radiologist Assistant, and communication
documented in the PACS or [REDACTED].

## 2023-02-07 IMAGING — DX DG CHEST 1V PORT
1 series · 1 of 1 positions shown · non-contrast
Comparison: 09/17/2020

CLINICAL DATA: Pulmonary edema, [id number] positive.

EXAM:
PORTABLE CHEST 1 VIEW

[chest ap]
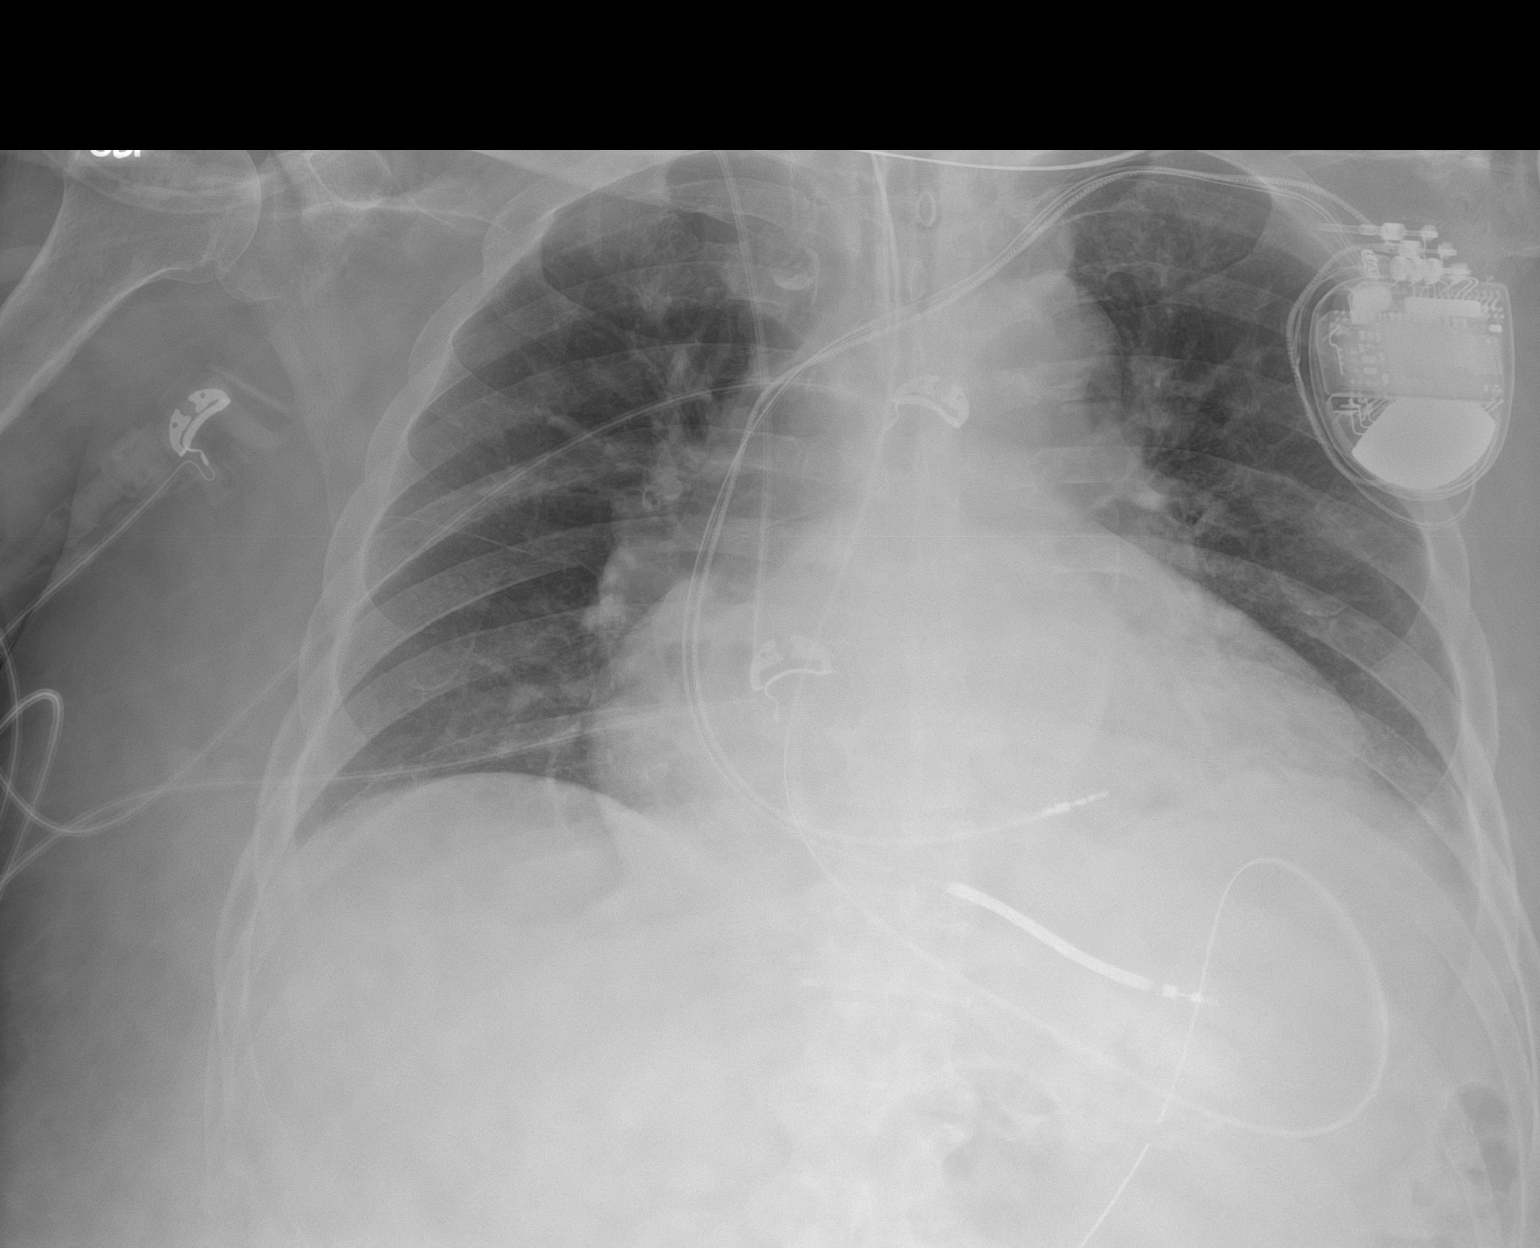

[1 of 1 positions shown; findings below may reference images not displayed]

FINDINGS: Endotracheal tube, nasogastric tube and right IJ central venous
catheter unchanged. Dual lead left-sided pacemaker unchanged.

Lungs are hypoinflated with subtle left base/retrocardiac density
unchanged and may be due to minimal atelectasis or infection. No
effusion. Mild stable cardiomegaly. Remainder of the exam is
unchanged.
IMPRESSION: 1. Minimal left base/retrocardiac density unchanged and may be due
to atelectasis or infection.

2.  Tubes and lines unchanged.
